# Patient Record
Sex: Female | Born: 1950 | Race: White | Hispanic: No | Marital: Married | Smoking: Former smoker
Health system: Southern US, Community
[De-identification: ages and names within clinical notes are randomized; demographics above are authoritative.]

## PROBLEM LIST (undated history)

## (undated) ENCOUNTER — Ambulatory Visit (HOSPITAL_COMMUNITY): Admission: EM

## (undated) DIAGNOSIS — G4733 Obstructive sleep apnea (adult) (pediatric): Secondary | ICD-10-CM

## (undated) DIAGNOSIS — Z8701 Personal history of pneumonia (recurrent): Secondary | ICD-10-CM

## (undated) DIAGNOSIS — J342 Deviated nasal septum: Secondary | ICD-10-CM

## (undated) DIAGNOSIS — R0789 Other chest pain: Secondary | ICD-10-CM

## (undated) DIAGNOSIS — E119 Type 2 diabetes mellitus without complications: Secondary | ICD-10-CM

## (undated) DIAGNOSIS — K090 Developmental odontogenic cysts: Secondary | ICD-10-CM

## (undated) DIAGNOSIS — M65312 Trigger thumb, left thumb: Secondary | ICD-10-CM

## (undated) DIAGNOSIS — T7840XA Allergy, unspecified, initial encounter: Secondary | ICD-10-CM

## (undated) DIAGNOSIS — I10 Essential (primary) hypertension: Secondary | ICD-10-CM

## (undated) DIAGNOSIS — M1990 Unspecified osteoarthritis, unspecified site: Secondary | ICD-10-CM

## (undated) HISTORY — DX: Allergy, unspecified, initial encounter: T78.40XA

## (undated) HISTORY — DX: Personal history of pneumonia (recurrent): Z87.01

## (undated) HISTORY — PX: TONSILLECTOMY: SHX5217

## (undated) HISTORY — PX: TUBAL LIGATION: SHX77

## (undated) HISTORY — DX: Other chest pain: R07.89

## (undated) HISTORY — DX: Developmental odontogenic cysts: K09.0

## (undated) HISTORY — DX: Obstructive sleep apnea (adult) (pediatric): G47.33

## (undated) HISTORY — PX: CHOLECYSTECTOMY: SHX55

## (undated) HISTORY — DX: Essential (primary) hypertension: I10

## (undated) HISTORY — DX: Deviated nasal septum: J34.2

## (undated) HISTORY — PX: OTHER SURGICAL HISTORY: SHX169

---

## 1997-12-10 ENCOUNTER — Ambulatory Visit (HOSPITAL_COMMUNITY): Admission: RE | Admit: 1997-12-10 | Discharge: 1997-12-10 | Payer: Self-pay | Admitting: Internal Medicine

## 1998-07-16 ENCOUNTER — Encounter: Payer: Self-pay | Admitting: Internal Medicine

## 1998-07-16 ENCOUNTER — Ambulatory Visit (HOSPITAL_COMMUNITY): Admission: RE | Admit: 1998-07-16 | Discharge: 1998-07-16 | Payer: Self-pay | Admitting: Internal Medicine

## 1999-03-19 ENCOUNTER — Ambulatory Visit (HOSPITAL_COMMUNITY): Admission: RE | Admit: 1999-03-19 | Discharge: 1999-03-19 | Payer: Self-pay | Admitting: Internal Medicine

## 1999-03-19 ENCOUNTER — Encounter: Payer: Self-pay | Admitting: Internal Medicine

## 1999-07-06 ENCOUNTER — Other Ambulatory Visit: Admission: RE | Admit: 1999-07-06 | Discharge: 1999-07-06 | Payer: Self-pay | Admitting: Internal Medicine

## 1999-10-27 ENCOUNTER — Ambulatory Visit (HOSPITAL_BASED_OUTPATIENT_CLINIC_OR_DEPARTMENT_OTHER): Admission: RE | Admit: 1999-10-27 | Discharge: 1999-10-27 | Payer: Self-pay | Admitting: Orthopedic Surgery

## 2000-01-06 ENCOUNTER — Encounter: Payer: Self-pay | Admitting: Pulmonary Disease

## 2000-03-18 ENCOUNTER — Other Ambulatory Visit: Admission: RE | Admit: 2000-03-18 | Discharge: 2000-03-18 | Payer: Self-pay | Admitting: Internal Medicine

## 2000-04-26 ENCOUNTER — Ambulatory Visit (HOSPITAL_COMMUNITY): Admission: RE | Admit: 2000-04-26 | Discharge: 2000-04-26 | Payer: Self-pay | Admitting: Internal Medicine

## 2000-04-26 ENCOUNTER — Encounter: Payer: Self-pay | Admitting: Internal Medicine

## 2001-09-17 ENCOUNTER — Ambulatory Visit (HOSPITAL_BASED_OUTPATIENT_CLINIC_OR_DEPARTMENT_OTHER): Admission: RE | Admit: 2001-09-17 | Discharge: 2001-09-17 | Payer: Self-pay | Admitting: *Deleted

## 2001-10-09 ENCOUNTER — Encounter: Payer: Self-pay | Admitting: Internal Medicine

## 2001-10-09 ENCOUNTER — Ambulatory Visit (HOSPITAL_COMMUNITY): Admission: RE | Admit: 2001-10-09 | Discharge: 2001-10-09 | Payer: Self-pay | Admitting: Internal Medicine

## 2001-11-07 ENCOUNTER — Other Ambulatory Visit: Admission: RE | Admit: 2001-11-07 | Discharge: 2001-11-07 | Payer: Self-pay | Admitting: Internal Medicine

## 2001-11-17 ENCOUNTER — Ambulatory Visit (HOSPITAL_BASED_OUTPATIENT_CLINIC_OR_DEPARTMENT_OTHER): Admission: RE | Admit: 2001-11-17 | Discharge: 2001-11-17 | Payer: Self-pay | Admitting: *Deleted

## 2002-01-10 ENCOUNTER — Encounter: Payer: Self-pay | Admitting: Emergency Medicine

## 2002-01-10 ENCOUNTER — Emergency Department (HOSPITAL_COMMUNITY): Admission: EM | Admit: 2002-01-10 | Discharge: 2002-01-10 | Payer: Self-pay | Admitting: *Deleted

## 2002-02-01 ENCOUNTER — Encounter: Payer: Self-pay | Admitting: Internal Medicine

## 2003-01-25 ENCOUNTER — Other Ambulatory Visit: Admission: RE | Admit: 2003-01-25 | Discharge: 2003-01-25 | Payer: Self-pay | Admitting: Internal Medicine

## 2003-08-26 ENCOUNTER — Other Ambulatory Visit: Admission: RE | Admit: 2003-08-26 | Discharge: 2003-08-26 | Payer: Self-pay | Admitting: Gynecology

## 2004-02-11 ENCOUNTER — Ambulatory Visit (HOSPITAL_COMMUNITY): Admission: RE | Admit: 2004-02-11 | Discharge: 2004-02-11 | Payer: Self-pay | Admitting: Internal Medicine

## 2004-07-17 ENCOUNTER — Ambulatory Visit: Payer: Self-pay | Admitting: Internal Medicine

## 2004-07-22 ENCOUNTER — Encounter: Admission: RE | Admit: 2004-07-22 | Discharge: 2004-07-22 | Payer: Self-pay | Admitting: Sports Medicine

## 2004-12-25 ENCOUNTER — Emergency Department (HOSPITAL_COMMUNITY): Admission: EM | Admit: 2004-12-25 | Discharge: 2004-12-26 | Payer: Self-pay | Admitting: Emergency Medicine

## 2005-01-25 ENCOUNTER — Encounter (INDEPENDENT_AMBULATORY_CARE_PROVIDER_SITE_OTHER): Payer: Self-pay | Admitting: Specialist

## 2005-01-25 ENCOUNTER — Ambulatory Visit (HOSPITAL_COMMUNITY): Admission: RE | Admit: 2005-01-25 | Discharge: 2005-01-25 | Payer: Self-pay

## 2005-06-30 ENCOUNTER — Ambulatory Visit: Payer: Self-pay | Admitting: Internal Medicine

## 2005-09-01 ENCOUNTER — Encounter
Admission: RE | Admit: 2005-09-01 | Discharge: 2005-11-30 | Payer: Self-pay | Admitting: Physical Medicine & Rehabilitation

## 2005-09-01 ENCOUNTER — Ambulatory Visit: Payer: Self-pay | Admitting: Physical Medicine & Rehabilitation

## 2005-09-21 ENCOUNTER — Ambulatory Visit (HOSPITAL_COMMUNITY): Admission: RE | Admit: 2005-09-21 | Discharge: 2005-09-21 | Payer: Self-pay | Admitting: Internal Medicine

## 2005-11-02 ENCOUNTER — Ambulatory Visit: Payer: Self-pay | Admitting: Internal Medicine

## 2005-11-09 ENCOUNTER — Other Ambulatory Visit: Admission: RE | Admit: 2005-11-09 | Discharge: 2005-11-09 | Payer: Self-pay | Admitting: Internal Medicine

## 2005-11-09 ENCOUNTER — Encounter: Payer: Self-pay | Admitting: Internal Medicine

## 2005-11-09 ENCOUNTER — Ambulatory Visit: Payer: Self-pay | Admitting: Internal Medicine

## 2005-11-17 ENCOUNTER — Ambulatory Visit: Payer: Self-pay | Admitting: Physical Medicine & Rehabilitation

## 2005-12-29 ENCOUNTER — Ambulatory Visit: Payer: Self-pay | Admitting: Internal Medicine

## 2006-04-21 ENCOUNTER — Ambulatory Visit: Payer: Self-pay | Admitting: Internal Medicine

## 2006-04-30 ENCOUNTER — Emergency Department (HOSPITAL_COMMUNITY): Admission: EM | Admit: 2006-04-30 | Discharge: 2006-04-30 | Payer: Self-pay | Admitting: Emergency Medicine

## 2006-07-01 ENCOUNTER — Emergency Department (HOSPITAL_COMMUNITY): Admission: EM | Admit: 2006-07-01 | Discharge: 2006-07-01 | Payer: Self-pay | Admitting: Emergency Medicine

## 2006-07-04 ENCOUNTER — Ambulatory Visit: Payer: Self-pay | Admitting: Internal Medicine

## 2007-04-17 ENCOUNTER — Telehealth: Payer: Self-pay | Admitting: Internal Medicine

## 2007-06-16 ENCOUNTER — Telehealth: Payer: Self-pay | Admitting: Internal Medicine

## 2007-07-17 ENCOUNTER — Telehealth: Payer: Self-pay | Admitting: Internal Medicine

## 2007-07-21 ENCOUNTER — Telehealth: Payer: Self-pay | Admitting: Internal Medicine

## 2007-07-27 ENCOUNTER — Ambulatory Visit (HOSPITAL_COMMUNITY): Admission: RE | Admit: 2007-07-27 | Discharge: 2007-07-27 | Payer: Self-pay | Admitting: Internal Medicine

## 2007-07-28 ENCOUNTER — Ambulatory Visit: Payer: Self-pay | Admitting: Internal Medicine

## 2007-07-28 LAB — CONVERTED CEMR LAB
Bilirubin Urine: NEGATIVE
Glucose, Urine, Semiquant: NEGATIVE
Ketones, urine, test strip: NEGATIVE
Nitrite: NEGATIVE
Protein, U semiquant: NEGATIVE
Specific Gravity, Urine: 1.02
Urobilinogen, UA: 0.2
WBC Urine, dipstick: NEGATIVE
pH: 7.5

## 2007-07-30 LAB — CONVERTED CEMR LAB
ALT: 22 units/L (ref 0–35)
AST: 19 units/L (ref 0–37)
Albumin: 4 g/dL (ref 3.5–5.2)
Alkaline Phosphatase: 66 units/L (ref 39–117)
BUN: 12 mg/dL (ref 6–23)
Basophils Absolute: 0 10*3/uL (ref 0.0–0.1)
Basophils Relative: 0.2 % (ref 0.0–1.0)
Bilirubin, Direct: 0.1 mg/dL (ref 0.0–0.3)
CO2: 31 meq/L (ref 19–32)
Calcium: 9.3 mg/dL (ref 8.4–10.5)
Chloride: 107 meq/L (ref 96–112)
Cholesterol: 203 mg/dL (ref 0–200)
Creatinine, Ser: 1 mg/dL (ref 0.4–1.2)
Direct LDL: 123.3 mg/dL
Eosinophils Absolute: 0.2 10*3/uL (ref 0.0–0.6)
Eosinophils Relative: 3.9 % (ref 0.0–5.0)
GFR calc Af Amer: 74 mL/min
GFR calc non Af Amer: 61 mL/min
Glucose, Bld: 89 mg/dL (ref 70–99)
HCT: 43.6 % (ref 36.0–46.0)
HDL: 38.8 mg/dL — ABNORMAL LOW (ref >39.0)
Hemoglobin: 14.8 g/dL (ref 12.0–15.0)
Lymphocytes Relative: 40.8 % (ref 12.0–46.0)
MCHC: 34.1 g/dL (ref 30.0–36.0)
MCV: 90.3 fL (ref 78.0–100.0)
Monocytes Absolute: 0.5 10*3/uL (ref 0.2–0.7)
Monocytes Relative: 9 % (ref 3.0–11.0)
Neutro Abs: 2.7 10*3/uL (ref 1.4–7.7)
Neutrophils Relative %: 46.1 % (ref 43.0–77.0)
Platelets: 213 10*3/uL (ref 150–400)
Potassium: 5.1 meq/L (ref 3.5–5.1)
RBC: 4.82 M/uL (ref 3.87–5.11)
RDW: 12.6 % (ref 11.5–14.6)
Sodium: 144 meq/L (ref 135–145)
TSH: 1.22 microintl units/mL (ref 0.35–5.50)
Total Bilirubin: 1.1 mg/dL (ref 0.3–1.2)
Total CHOL/HDL Ratio: 5.2
Total Protein: 6 g/dL (ref 6.0–8.3)
Triglycerides: 159 mg/dL — ABNORMAL HIGH (ref 0–149)
VLDL: 32 mg/dL (ref 0–40)
WBC: 5.8 10*3/uL (ref 4.5–10.5)

## 2007-08-15 ENCOUNTER — Telehealth: Payer: Self-pay | Admitting: Internal Medicine

## 2007-08-30 ENCOUNTER — Ambulatory Visit: Payer: Self-pay | Admitting: Internal Medicine

## 2007-08-30 ENCOUNTER — Telehealth (INDEPENDENT_AMBULATORY_CARE_PROVIDER_SITE_OTHER): Payer: Self-pay | Admitting: *Deleted

## 2007-08-30 DIAGNOSIS — R0789 Other chest pain: Secondary | ICD-10-CM

## 2007-08-30 DIAGNOSIS — J019 Acute sinusitis, unspecified: Secondary | ICD-10-CM | POA: Insufficient documentation

## 2007-08-30 DIAGNOSIS — I10 Essential (primary) hypertension: Secondary | ICD-10-CM | POA: Insufficient documentation

## 2007-08-30 DIAGNOSIS — G4731 Primary central sleep apnea: Secondary | ICD-10-CM

## 2007-08-30 DIAGNOSIS — G4733 Obstructive sleep apnea (adult) (pediatric): Secondary | ICD-10-CM

## 2007-08-30 HISTORY — DX: Obstructive sleep apnea (adult) (pediatric): G47.33

## 2007-08-30 HISTORY — DX: Other chest pain: R07.89

## 2007-09-04 ENCOUNTER — Telehealth: Payer: Self-pay | Admitting: *Deleted

## 2007-10-12 ENCOUNTER — Encounter: Payer: Self-pay | Admitting: Internal Medicine

## 2007-10-12 ENCOUNTER — Encounter: Payer: Self-pay | Admitting: Cardiovascular Disease

## 2007-10-13 ENCOUNTER — Ambulatory Visit: Payer: Self-pay | Admitting: Pulmonary Disease

## 2007-10-13 DIAGNOSIS — K219 Gastro-esophageal reflux disease without esophagitis: Secondary | ICD-10-CM | POA: Insufficient documentation

## 2007-10-16 ENCOUNTER — Telehealth: Payer: Self-pay | Admitting: Internal Medicine

## 2007-10-18 ENCOUNTER — Telehealth: Payer: Self-pay | Admitting: Internal Medicine

## 2007-10-24 ENCOUNTER — Telehealth: Payer: Self-pay | Admitting: Internal Medicine

## 2007-10-30 ENCOUNTER — Encounter: Payer: Self-pay | Admitting: Internal Medicine

## 2007-10-30 ENCOUNTER — Other Ambulatory Visit: Admission: RE | Admit: 2007-10-30 | Discharge: 2007-10-30 | Payer: Self-pay | Admitting: Internal Medicine

## 2007-10-30 ENCOUNTER — Ambulatory Visit: Payer: Self-pay | Admitting: Internal Medicine

## 2007-10-30 DIAGNOSIS — F4321 Adjustment disorder with depressed mood: Secondary | ICD-10-CM

## 2007-10-30 LAB — CONVERTED CEMR LAB: Pap Smear: NORMAL

## 2007-11-19 ENCOUNTER — Encounter: Payer: Self-pay | Admitting: Internal Medicine

## 2007-12-12 ENCOUNTER — Ambulatory Visit: Payer: Self-pay | Admitting: Pulmonary Disease

## 2007-12-12 DIAGNOSIS — M79609 Pain in unspecified limb: Secondary | ICD-10-CM | POA: Insufficient documentation

## 2007-12-12 DIAGNOSIS — G4700 Insomnia, unspecified: Secondary | ICD-10-CM

## 2007-12-21 ENCOUNTER — Telehealth: Payer: Self-pay | Admitting: Internal Medicine

## 2007-12-25 ENCOUNTER — Telehealth: Payer: Self-pay | Admitting: Pulmonary Disease

## 2008-03-04 ENCOUNTER — Telehealth: Payer: Self-pay | Admitting: Internal Medicine

## 2008-03-08 ENCOUNTER — Encounter: Payer: Self-pay | Admitting: Internal Medicine

## 2008-03-14 ENCOUNTER — Encounter: Admission: RE | Admit: 2008-03-14 | Discharge: 2008-03-14 | Payer: Self-pay | Admitting: Sports Medicine

## 2008-03-22 ENCOUNTER — Ambulatory Visit: Payer: Self-pay | Admitting: Pulmonary Disease

## 2008-03-23 ENCOUNTER — Encounter: Admission: RE | Admit: 2008-03-23 | Discharge: 2008-03-23 | Payer: Self-pay | Admitting: Sports Medicine

## 2008-03-26 ENCOUNTER — Ambulatory Visit: Payer: Self-pay | Admitting: Family Medicine

## 2008-03-26 DIAGNOSIS — N751 Abscess of Bartholin's gland: Secondary | ICD-10-CM

## 2008-04-04 ENCOUNTER — Encounter: Admission: RE | Admit: 2008-04-04 | Discharge: 2008-04-04 | Payer: Self-pay | Admitting: Family Medicine

## 2008-10-05 ENCOUNTER — Emergency Department (HOSPITAL_COMMUNITY): Admission: EM | Admit: 2008-10-05 | Discharge: 2008-10-05 | Payer: Self-pay | Admitting: Family Medicine

## 2008-12-19 ENCOUNTER — Telehealth: Payer: Self-pay | Admitting: Internal Medicine

## 2008-12-27 ENCOUNTER — Ambulatory Visit: Payer: Self-pay | Admitting: Internal Medicine

## 2008-12-30 LAB — CONVERTED CEMR LAB
ALT: 20 units/L (ref 0–35)
AST: 23 units/L (ref 0–37)
Albumin: 4.1 g/dL (ref 3.5–5.2)
Alkaline Phosphatase: 67 units/L (ref 39–117)
BUN: 14 mg/dL (ref 6–23)
Basophils Absolute: 0 10*3/uL (ref 0.0–0.1)
Basophils Relative: 0.1 % (ref 0.0–3.0)
Bilirubin, Direct: 0 mg/dL (ref 0.0–0.3)
CO2: 28 meq/L (ref 19–32)
Calcium: 8.8 mg/dL (ref 8.4–10.5)
Chloride: 104 meq/L (ref 96–112)
Cholesterol: 215 mg/dL — ABNORMAL HIGH (ref 0–200)
Creatinine, Ser: 0.9 mg/dL (ref 0.4–1.2)
Direct LDL: 140.1 mg/dL
Eosinophils Absolute: 0.1 10*3/uL (ref 0.0–0.7)
Eosinophils Relative: 2.8 % (ref 0.0–5.0)
GFR calc non Af Amer: 68.45 mL/min (ref >60)
Glucose, Bld: 89 mg/dL (ref 70–99)
HCT: 41.6 % (ref 36.0–46.0)
HDL: 48.3 mg/dL (ref >39.00)
Hemoglobin: 14.3 g/dL (ref 12.0–15.0)
Lymphocytes Relative: 43.6 % (ref 12.0–46.0)
Lymphs Abs: 2.2 10*3/uL (ref 0.7–4.0)
MCHC: 34.3 g/dL (ref 30.0–36.0)
MCV: 90.2 fL (ref 78.0–100.0)
Monocytes Absolute: 0.5 10*3/uL (ref 0.1–1.0)
Monocytes Relative: 10.4 % (ref 3.0–12.0)
Neutro Abs: 2.1 10*3/uL (ref 1.4–7.7)
Neutrophils Relative %: 43.1 % (ref 43.0–77.0)
Platelets: 217 10*3/uL (ref 150.0–400.0)
Potassium: 3.7 meq/L (ref 3.5–5.1)
RBC: 4.61 M/uL (ref 3.87–5.11)
RDW: 13.1 % (ref 11.5–14.6)
Sodium: 140 meq/L (ref 135–145)
TSH: 1.39 microintl units/mL (ref 0.35–5.50)
Total Bilirubin: 1 mg/dL (ref 0.3–1.2)
Total CHOL/HDL Ratio: 4
Total Protein: 6.6 g/dL (ref 6.0–8.3)
Triglycerides: 160 mg/dL — ABNORMAL HIGH (ref 0.0–149.0)
VLDL: 32 mg/dL (ref 0.0–40.0)
WBC: 4.9 10*3/uL (ref 4.5–10.5)

## 2009-01-03 LAB — CONVERTED CEMR LAB: Vit D, 25-Hydroxy: 26 ng/mL — ABNORMAL LOW (ref 30–89)

## 2009-01-06 ENCOUNTER — Ambulatory Visit: Payer: Self-pay | Admitting: Internal Medicine

## 2009-01-22 ENCOUNTER — Ambulatory Visit: Payer: Self-pay | Admitting: Family Medicine

## 2009-01-28 ENCOUNTER — Ambulatory Visit: Payer: Self-pay | Admitting: Internal Medicine

## 2009-02-06 ENCOUNTER — Telehealth: Payer: Self-pay | Admitting: *Deleted

## 2009-07-14 ENCOUNTER — Telehealth: Payer: Self-pay | Admitting: *Deleted

## 2009-09-17 ENCOUNTER — Telehealth: Payer: Self-pay | Admitting: *Deleted

## 2009-10-22 ENCOUNTER — Ambulatory Visit: Payer: Self-pay | Admitting: Internal Medicine

## 2009-11-13 ENCOUNTER — Ambulatory Visit (HOSPITAL_COMMUNITY): Admission: RE | Admit: 2009-11-13 | Discharge: 2009-11-13 | Payer: Self-pay | Admitting: Internal Medicine

## 2009-12-16 ENCOUNTER — Ambulatory Visit: Payer: Self-pay | Admitting: Internal Medicine

## 2009-12-16 LAB — CONVERTED CEMR LAB
ALT: 20 units/L (ref 0–35)
AST: 19 units/L (ref 0–37)
Albumin: 4.3 g/dL (ref 3.5–5.2)
Alkaline Phosphatase: 66 units/L (ref 39–117)
BUN: 14 mg/dL (ref 6–23)
Basophils Absolute: 0 10*3/uL (ref 0.0–0.1)
Basophils Relative: 1 % (ref 0.0–3.0)
Bilirubin Urine: NEGATIVE
Bilirubin, Direct: 0.1 mg/dL (ref 0.0–0.3)
Blood in Urine, dipstick: NEGATIVE
CO2: 28 meq/L (ref 19–32)
Calcium: 8.8 mg/dL (ref 8.4–10.5)
Chloride: 105 meq/L (ref 96–112)
Cholesterol: 228 mg/dL — ABNORMAL HIGH (ref 0–200)
Creatinine, Ser: 0.9 mg/dL (ref 0.4–1.2)
Direct LDL: 137.8 mg/dL
Eosinophils Absolute: 0.2 10*3/uL (ref 0.0–0.7)
Eosinophils Relative: 4 % (ref 0.0–5.0)
GFR calc non Af Amer: 70.94 mL/min (ref >60)
Glucose, Bld: 85 mg/dL (ref 70–99)
Glucose, Urine, Semiquant: NEGATIVE
HCT: 40.3 % (ref 36.0–46.0)
HDL: 51.1 mg/dL (ref >39.00)
Hemoglobin: 14 g/dL (ref 12.0–15.0)
Ketones, urine, test strip: NEGATIVE
Lymphocytes Relative: 43 % (ref 12.0–46.0)
Lymphs Abs: 1.9 10*3/uL (ref 0.7–4.0)
MCHC: 34.8 g/dL (ref 30.0–36.0)
MCV: 90.8 fL (ref 78.0–100.0)
Monocytes Absolute: 0.4 10*3/uL (ref 0.1–1.0)
Monocytes Relative: 9.4 % (ref 3.0–12.0)
Neutro Abs: 1.9 10*3/uL (ref 1.4–7.7)
Neutrophils Relative %: 42.6 % — ABNORMAL LOW (ref 43.0–77.0)
Nitrite: NEGATIVE
Platelets: 209 10*3/uL (ref 150.0–400.0)
Potassium: 4.3 meq/L (ref 3.5–5.1)
RBC: 4.44 M/uL (ref 3.87–5.11)
RDW: 15.2 % — ABNORMAL HIGH (ref 11.5–14.6)
Sodium: 140 meq/L (ref 135–145)
Specific Gravity, Urine: 1.02
TSH: 1.36 microintl units/mL (ref 0.35–5.50)
Total Bilirubin: 0.8 mg/dL (ref 0.3–1.2)
Total CHOL/HDL Ratio: 4
Total Protein: 6.7 g/dL (ref 6.0–8.3)
Triglycerides: 211 mg/dL — ABNORMAL HIGH (ref 0.0–149.0)
Urobilinogen, UA: 0.2
VLDL: 42.2 mg/dL — ABNORMAL HIGH (ref 0.0–40.0)
WBC Urine, dipstick: NEGATIVE
WBC: 4.5 10*3/uL (ref 4.5–10.5)
pH: 8

## 2009-12-22 ENCOUNTER — Ambulatory Visit: Payer: Self-pay | Admitting: Internal Medicine

## 2009-12-22 DIAGNOSIS — R41840 Attention and concentration deficit: Secondary | ICD-10-CM | POA: Insufficient documentation

## 2009-12-22 DIAGNOSIS — E785 Hyperlipidemia, unspecified: Secondary | ICD-10-CM

## 2009-12-22 DIAGNOSIS — E669 Obesity, unspecified: Secondary | ICD-10-CM

## 2010-01-13 ENCOUNTER — Ambulatory Visit: Payer: Self-pay | Admitting: Internal Medicine

## 2010-01-13 DIAGNOSIS — T50995A Adverse effect of other drugs, medicaments and biological substances, initial encounter: Secondary | ICD-10-CM

## 2010-02-09 ENCOUNTER — Ambulatory Visit: Payer: Self-pay | Admitting: Cardiology

## 2010-06-09 ENCOUNTER — Ambulatory Visit
Admission: RE | Admit: 2010-06-09 | Discharge: 2010-06-09 | Payer: Self-pay | Source: Home / Self Care | Attending: Internal Medicine | Admitting: Internal Medicine

## 2010-06-09 ENCOUNTER — Encounter: Payer: Self-pay | Admitting: Internal Medicine

## 2010-06-09 DIAGNOSIS — S60459A Superficial foreign body of unspecified finger, initial encounter: Secondary | ICD-10-CM | POA: Insufficient documentation

## 2010-06-26 ENCOUNTER — Emergency Department (HOSPITAL_COMMUNITY)
Admission: EM | Admit: 2010-06-26 | Discharge: 2010-06-26 | Payer: Self-pay | Source: Home / Self Care | Admitting: Emergency Medicine

## 2010-06-29 ENCOUNTER — Telehealth: Payer: Self-pay | Admitting: Internal Medicine

## 2010-06-29 LAB — CBC
HCT: 42.2 % (ref 36.0–46.0)
Hemoglobin: 14.4 g/dL (ref 12.0–15.0)
MCH: 29.8 pg (ref 26.0–34.0)
MCHC: 34.1 g/dL (ref 30.0–36.0)
MCV: 87.4 fL (ref 78.0–100.0)
Platelets: 241 10*3/uL (ref 150–400)
RBC: 4.83 MIL/uL (ref 3.87–5.11)
RDW: 13.2 % (ref 11.5–15.5)
WBC: 5.4 10*3/uL (ref 4.0–10.5)

## 2010-06-29 LAB — BASIC METABOLIC PANEL
BUN: 9 mg/dL (ref 6–23)
CO2: 28 mEq/L (ref 19–32)
Calcium: 9.2 mg/dL (ref 8.4–10.5)
Chloride: 101 mEq/L (ref 96–112)
Creatinine, Ser: 1.08 mg/dL (ref 0.4–1.2)
GFR calc Af Amer: >60 mL/min (ref >60)
GFR calc non Af Amer: 52 mL/min — ABNORMAL LOW (ref >60)
Glucose, Bld: 149 mg/dL — ABNORMAL HIGH (ref 70–99)
Potassium: 3.7 mEq/L (ref 3.5–5.1)
Sodium: 139 mEq/L (ref 135–145)

## 2010-06-29 LAB — DIFFERENTIAL
Basophils Absolute: 0 10*3/uL (ref 0.0–0.1)
Basophils Relative: 1 % (ref 0–1)
Eosinophils Absolute: 0.2 10*3/uL (ref 0.0–0.7)
Eosinophils Relative: 3 % (ref 0–5)
Lymphocytes Relative: 39 % (ref 12–46)
Lymphs Abs: 2.1 10*3/uL (ref 0.7–4.0)
Monocytes Absolute: 0.4 10*3/uL (ref 0.1–1.0)
Monocytes Relative: 8 % (ref 3–12)
Neutro Abs: 2.6 10*3/uL (ref 1.7–7.7)
Neutrophils Relative %: 49 % (ref 43–77)

## 2010-06-29 LAB — POCT CARDIAC MARKERS
CKMB, poc: 1.3 ng/mL (ref 1.0–8.0)
Myoglobin, poc: 78.3 ng/mL (ref 12–200)
Troponin i, poc: <0.05 ng/mL (ref 0.00–0.09)

## 2010-07-05 ENCOUNTER — Encounter: Payer: Self-pay | Admitting: Internal Medicine

## 2010-07-13 ENCOUNTER — Telehealth: Payer: Self-pay | Admitting: Internal Medicine

## 2010-07-14 NOTE — Progress Notes (Signed)
Summary: refill on zolpidem  Phone Note From Pharmacy   Caller: [facility] [address] [phone]* Reason for Call: Needs renewal Details for Reason: Zolpidem  Summary of Call: last filled on 08/14/09 #30  Initial call taken by: Romualdo Bolk, CMA (AAMA),  September 17, 2009 1:10 PM  Follow-up for Phone Call        ok x 3  she is due for cpx or yearly visit in July .  haver her make appt for this .  Follow-up by: Madelin Headings MD,  September 17, 2009 2:06 PM  Additional Follow-up for Phone Call Additional follow up Details #1::        Rx faxed to the pharmacy and pt aware. She will call back to make a cpx appt in July. Additional Follow-up by: Romualdo Bolk, CMA (AAMA),  September 17, 2009 2:09 PM    Prescriptions: ZOLPIDEM TARTRATE 10 MG TABS (ZOLPIDEM TARTRATE) 1 by mouth at bedtime  #30 x 2   Entered by:   Romualdo Bolk, CMA (AAMA)   Authorized by:   Madelin Headings MD   Signed by:   Romualdo Bolk, CMA (AAMA) on 09/17/2009   Method used:   Handwritten   RxID:   [id number]

## 2010-07-14 NOTE — Assessment & Plan Note (Signed)
Summary: CPX // RS   Vital Signs:  Patient profile:   60 year old female Menstrual status:  postmenopausal Height:      63.5 inches Weight:      206 pounds BMI:     36.05 Pulse rate:   66 / minute BP sitting:   100 / 70  (left arm) Cuff size:   large  Vitals Entered By: Romualdo Bolk, CMA (AAMA) (December 22, 2009 10:06 AM)  Nutrition Counseling: Patient's BMI is greater than 25 and therefore counseled on weight management options. CC: CPX- Discuss doing a pap-Discuss going on something for ADHD and changing celexa to something else for depression due to weight gain.   History of Present Illness: Rachel Blair comes in today  for preventive visit . Since last visit  here  there have been no major changes in health status  .  However she ahd a number of concerns and medical issues . HT  : controlled  No se of medds Sleep   using ambien  OSA  not treating.   by personal choice.   tried cpap  for 6 months    GERD:  no rx meds currently  mood  :   Gained weight on celexa.    concern about attention and mood.  depression .sx  ? could she have add .  But no dx.    Preventive Care Screening  Pap Smear:    Date:  10/30/2007    Results:  normal   Prior Values:    Mammogram:  ASSESSMENT: Negative - BI-RADS 1^MM DIGITAL SCREENING (11/13/2009)    Colonoscopy:  Location:  [facility].   (01/28/2009)   Preventive Screening-Counseling & Management  Alcohol-Tobacco     Alcohol drinks/day: <1     Alcohol type: wine     Smoking Status: quit     Year Quit: 2000     Pack years: 1 ppd x 20 years     Tobacco Counseling: not to resume use of tobacco products  Caffeine-Diet-Exercise     Caffeine use/day: 3     Does Patient Exercise: yes     Type of exercise: swimming and walking  Hep-HIV-STD-Contraception     Sun Exposure-Excessive: no  Safety-Violence-Falls     Seat Belt Use: yes     Smoke Detectors: yes  Current Medications (verified): 1)  Hydrochlorothiazide 25  Mg Tabs (Hydrochlorothiazide) 2)  Zolpidem Tartrate 10 Mg Tabs (Zolpidem Tartrate) .Marland Kitchen.. 1 By Mouth At Bedtime 3)  Celebrex 200 Mg Caps (Celecoxib) .Marland Kitchen.. 1 By Mouth Once Daily As Needed Per Dr Leeanne Deed 4)  Fluticasone Propionate 50 Mcg/act Susp (Fluticasone Propionate) .... Take 2 Sprays Each Nostril  For Allergy Control 5)  Aleve 220 Mg Tabs (Naproxen Sodium) 6)  Celexa 20 Mg Tabs (Citalopram Hydrobromide)  Allergies (verified): 1)  * Vicodin 2)  * Avinza  Past History:  Past medical, surgical, family and social histories (including risk factors) reviewed, and no changes noted (except as noted below).  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  Past Surgical History: Reviewed history from 10/13/2007 and no changes required. Cholecystectomy Tonsillectomy Tubal ligation Left thumb trigger finger release Biopsy and bone graft of left maxillary sinus odontogenic cyst  Past History:  Care Management: Cardiology: [facility] Pulmonary: [facility]ood Podiatry: [facility]. Sleep:    Family History: Reviewed history from 12/27/2008 and no changes  required.  Father - emphysema, stroke, chf  Mother - heart disease Brother - lung cancer  Brother - snoring Daughter: RA    Social History: Reviewed history from 10/22/2009 and no changes required. 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         plantar fasciitis   Physical Exam General Appearance: well developed, well nourished, no acute distress Eyes: conjunctiva and lids normal, PERRLA, EOMI, WNL Ears, Nose, Mouth, Throat: TM clear, nares clear, oral exam WNL Neck: supple, no lymphadenopathy, no thyromegaly, no JVD Respiratory: clear to auscultation and percussion, respiratory effort normal Cardiovascular: regular rate and rhythm, S1-S2, no murmur, rub or gallop, no bruits, peripheral pulses normal and symmetric, no cyanosis, clubbing, edema or varicosities Chest: no scars, masses, tenderness; no asymmetry, skin changes, nipple discharge   Gastrointestinal: soft, non-tender; no hepatosplenomegaly, masses; active bowel sounds all quadrants,  Lymphatic: no cervical, axillary or inguinal adenopathy Musculoskeletal: gait normal, muscle tone and strength WNL, no joint swelling, effusions, discoloration, crepitus  Skin: clear, good turgor, color WNL, no rashes, lesions, or ulcerations Neurologic: normal mental status, normal reflexes, normal strength, sensation, and motion Psychiatric: alert; oriented to person, place and time Other Exam:  see labs     Impression & Recommendations:  Problem # 1:  PREVENTIVE HEALTH CARE (ICD-V70.0) Discussed nutrition,exercise,diet,healthy weight, vitamin D and calcium.   Problem # 2:  HYPERTENSION (ICD-401.9)  Her updated medication list for this problem includes:    Hydrochlorothiazide 25 Mg Tabs (Hydrochlorothiazide) .Marland Kitchen... 1 by mouth once daily  BP today: 100/70 Prior BP: 120/80 (10/22/2009)  Prior 10 Yr Risk Heart Disease: Not enough information (12/27/2008)  Labs Reviewed: K+: 4.3 (12/16/2009) Creat: : 0.9 (12/16/2009)   Chol: 228 (12/16/2009)   HDL: 51.10 (12/16/2009)   LDL: DEL (07/28/2007)   TG: 211.0 (12/16/2009)  Problem # 3:  G E R D  (ICD-530.81)  Problem # 4:  OBSTRUCTIVE SLEEP APNEA (ICD-327.23) NOt on rx  currently   by her choice  .    disc this  in regard to health  risk   and attention  pt says rx doesnt help her .   Problem # 5:  OBESITY (ICD-278.00) counseled  Ht: 63.5 (12/22/2009)   Wt: 206 (12/22/2009)   BMI: 36.05 (12/22/2009)  Problem # 6:  ADJUSTMENT DISORDER WITH DEPRESSED MOOD (ICD-309.0) begin  cymbalata and may help her joimt pains also   Problem # 7:  ATTENTION OR CONCENTRATION DEFICIT (ICD-799.51) osa and mood certainly could affect this .   doesnt exclude add  but need to rx other and before  looking at other dx.   considre further eval aas needed in follow up   Remus Loffler could also cause a problem with memory   Problem # 8:  INSOMNIA (ICD-780.52) Assessment: Comment Only  Her updated medication list for this problem includes:    Zolpidem Tartrate 10 Mg Tabs (Zolpidem tartrate) .Marland Kitchen... 1 by mouth at bedtime  Problem # 9:  HYPERLIPIDEMIA (ICD-272.4) rec lifestyle intervention  at prsent weight loss etc.  Labs Reviewed: SGOT: 19 (12/16/2009)   SGPT: 20 (12/16/2009)  Prior 10 Yr Risk Heart Disease: Not enough information (12/27/2008)   HDL:51.10 (12/16/2009), 48.30 (12/27/2008)  LDL:DEL (07/28/2007)  Chol:228 (12/16/2009), 215 (12/27/2008)  Trig:211.0 (12/16/2009), 160.0 (12/27/2008)  Problem # 10:  plantar fasciitis    Complete Medication List: 1)  Hydrochlorothiazide 25 Mg Tabs (Hydrochlorothiazide) .Marland Kitchen.. 1 by mouth once daily 2)  Zolpidem Tartrate 10 Mg Tabs (Zolpidem tartrate) .Marland Kitchen.. 1 by mouth at bedtime 3)  Celebrex 200 Mg Caps (Celecoxib) .Marland Kitchen.. 1 by mouth once daily as needed per dr Leeanne Deed 4)  Fluticasone Propionate 50 Mcg/act Susp (Fluticasone propionate) .... Take 2 sprays each nostril  for allergy control 5)  Aleve 220 Mg Tabs (Naproxen sodium) 6)  Cymbalta 60 Mg Cpep (Duloxetine hcl) .... Take 30 mg per day  for a week and then 60 mg per day  Patient Instructions: 1)  good sleep will  help losing weight . 2)  begin the 30 mg of cymbalta and then 60 mg per day    3)  rov in 3 weeks  or as needed.  4)  Attention can be affected  by sleep deprivation and depression also.  Prescriptions: HYDROCHLOROTHIAZIDE 25 MG TABS (HYDROCHLOROTHIAZIDE) 1 by mouth once daily  #90 x 3   Entered and Authorized by:   Madelin Headings MD   Signed by:   Madelin Headings MD on 12/22/2009   Method used:   Electronically to        [facility] [address] [phone]* (retail)       [address]       [address]       [place], [place]  [address]       Ph: [phone]       Fax: [phone]   RxID:   [id number] ZOLPIDEM TARTRATE 10 MG TABS (ZOLPIDEM TARTRATE) 1 by mouth at bedtime  #30 x 3   Entered and Authorized by:   Madelin Headings MD   Signed by:   Madelin Headings MD on 12/22/2009   Method used:   Print then Give to Patient   RxID:   [id number]   Preventive Care Screening  Pap Smear:    Date:  10/30/2007    Results:  normal   Prior Values:    Mammogram:  ASSESSMENT: Negative - BI-RADS 1^MM DIGITAL SCREENING (11/13/2009)    Colonoscopy:  Location:  [facility].   (01/28/2009)

## 2010-07-14 NOTE — Progress Notes (Signed)
Summary: med refill  Phone Note Call from Patient Call back at Home Phone [phone]   Caller: Patient Call For: Madelin Headings MD Summary of Call: pt needs refill on ambien [facility]  [phone] Initial call taken by: Heron Sabins,  July 14, 2009 11:27 AM  Follow-up for Phone Call        Spoke to and she also had a side effect on lunesta with a metal taste. Pt states she doesn't think it was the [place] that made her mean and irriable. So she would like to try it again. I told pt that I would check with Dr. Fabian Sharp and call her to let her know when we called in rx. Follow-up by: Romualdo Bolk, CMA Duncan Dull),  July 14, 2009 2:28 PM  Additional Follow-up for Phone Call Additional follow up Details #1::        ok to try to refill her ambien 10  # 30  but I see that she has refills  on her rx in July . She could refill this unless inactive   and call us  about status.  if not call in 30# of the 10 mg. Additional Follow-up by: Madelin Headings MD,  July 15, 2009 8:29 AM    Additional Follow-up for Phone Call Additional follow up Details #2::    Spoke to pt and she has already used those up. So I told pt that we would call in #30 tabs in today. Pt states that she didn't know what was wrong back then and she was ready to blame anything. But since she has been taking the [place] for awhile it has been doing fine. Rx called into the pharmacy. Follow-up by: Romualdo Bolk, CMA (AAMA),  July 15, 2009 9:18 AM  Prescriptions: ZOLPIDEM TARTRATE 10 MG TABS (ZOLPIDEM TARTRATE) 1 by mouth at bedtime  #30 x 0   Entered by:   Romualdo Bolk, CMA (AAMA)   Authorized by:   Madelin Headings MD   Signed by:   Romualdo Bolk, CMA (AAMA) on 07/15/2009   Method used:   Telephoned to ...       [facility] [address] [phone]* (retail)       [address]       [address]       [place], [place]  [address]       Ph: [phone]       Fax: [phone]   RxID:    [id number]

## 2010-07-14 NOTE — Assessment & Plan Note (Signed)
Summary: ALLERGIES? // RS   Vital Signs:  Patient profile:   60 year old female Menstrual status:  postmenopausal Height:      64 inches Weight:      207 pounds BMI:     35.66 O2 Sat:      98 % on Room air Temp:     98.6 degrees F oral Pulse rate:   86 / minute BP sitting:   120 / 80  (left arm) Cuff size:   large  Vitals Entered By: Romualdo Bolk, CMA (AAMA) (Oct 22, 2009 11:59 AM)  O2 Flow:  Room air CC: Coughing, congestion, some wheezing and sob x 4-5 weeks, some fever on 5/9   History of Present Illness: Rachel Blair comesin comes in today  for  above  problem. Onset about 6 weeks ago like allergy with  head congestion  that is now  draining  down chest.   And getting a bit worse.  tried benadryl   with temp help.  but hoarse for  a while .  ? fever a few days ago.    No sinus pain.    ocass cough But thick discolored drainage and ocass brown phlegm.No ets  no  asthma signs .Retired this year and outside a lot during this allergy season.   REmote hx of nasal steroids use with help..     BP ok  on medication  due for check in summer .  Preventive Screening-Counseling & Management  Alcohol-Tobacco     Alcohol drinks/day: <1     Alcohol type: wine     Smoking Status: quit     Year Quit: 2000     Pack years: 1 ppd x 20 years  Caffeine-Diet-Exercise     Caffeine use/day: 3     Does Patient Exercise: no  Current Medications (verified): 1)  Hydrochlorothiazide 25 Mg Tabs (Hydrochlorothiazide) 2)  Zolpidem Tartrate 10 Mg Tabs (Zolpidem Tartrate) .Marland Kitchen.. 1 By Mouth At Bedtime 3)  Celebrex 200 Mg Caps (Celecoxib) .Marland Kitchen.. 1 By Mouth Once Daily As Needed Per Dr Leeanne Deed  Allergies (verified): 1)  * Vicodin 2)  * Avinza  Past History:  Past medical, surgical, family and social histories (including risk factors) reviewed, and no changes noted (except as noted below).  Past Medical History: OSA Atypical Chest Pain HTN GERD Nasal septal deviation Odontogenic cyst of  left maxillary sinus G3P3 remote history of pneumonia, times two Allergic rhinitis?  Past Surgical History: Reviewed history from 10/13/2007 and no changes required. Cholecystectomy Tonsillectomy Tubal ligation Left thumb trigger finger release Biopsy and bone graft of left maxillary sinus odontogenic cyst  Past History:  Care Management: Cardiology: [facility] Pulmonary: [facility] Podiatry: [facility].  Family History: Reviewed history from 12/27/2008 and no changes required.  Father - emphysema, stroke, chf  Mother - heart disease Brother - lung cancer  Brother - snoring Daughter: RA    Social History: Reviewed history from 12/27/2008 and no changes required. 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  The patient denies anorexia, weight loss, vision loss, decreased hearing, chest pain, headaches, abdominal pain, transient blindness, difficulty walking, abnormal bleeding, and enlarged lymph nodes.    Physical Exam  General:  alert, well-developed, well-nourished, and well-hydrated.  in nad  hoarse  non toxic  Head:  Normocephalic and atraumatic without obvious abnormalities. No apparent alopecia or balding. Eyes:  clear  Ears:  R ear normal, L ear normal, and no external deformities.   Nose:  no external deformity and no external erythema.  congested and  no tenderness Mouth:  pharynx pink and moist.   Neck:  No deformities, masses, or tenderness noted. Lungs:  Normal respiratory effort, chest expands symmetrically. Lungs are clear to auscultation, no crackles or wheezes.no dullness.   Heart:  Normal rate and regular rhythm. S1 and S2 normal without gallop, murmur, click, rub or other extra sounds.   Impression & Recommendations:  Problem # 1:  SINUSITIS- ACUTE-NOS (ICD-461.9)  poss allergy induced  .    Expectant management and rx  Her updated medication list for this problem includes:    Amoxicillin-pot Clavulanate 875-125 Mg Tabs (Amoxicillin-pot clavulanate) .Marland Kitchen... 1 by mouth two times a day for sinusitis    Fluticasone Propionate 50 Mcg/act Susp (Fluticasone propionate) .Marland Kitchen... Take 2 sprays each nostril  for allergy control  Instructed on treatment. Call if symptoms persist or worsen.   Problem # 2:  ALLERGIC RHINITIS (ICD-477.9)  Her updated medication list for this problem includes:    Fluticasone Propionate 50 Mcg/act Susp (Fluticasone propionate) .Marland Kitchen... Take 2 sprays each nostril  for allergy control  Problem # 3:  HYPERTENSION (ICD-401.9) Assessment: Comment Only  Her updated medication list for this problem includes:    Hydrochlorothiazide 25 Mg Tabs (Hydrochlorothiazide)  Complete Medication List: 1)  Hydrochlorothiazide 25 Mg Tabs (Hydrochlorothiazide) 2)  Zolpidem Tartrate 10 Mg Tabs (Zolpidem tartrate) .Marland Kitchen.. 1 by mouth at bedtime 3)  Celebrex 200 Mg Caps (Celecoxib) .Marland Kitchen.. 1 by mouth once daily as needed per dr Leeanne Deed 4)  Prednisone 20 Mg Tabs (Prednisone) .... Take 3 by mouth once daily for 2 days then 2 by mouth once daily for 3 days 5)  Amoxicillin-pot Clavulanate 875-125 Mg Tabs (Amoxicillin-pot clavulanate) .Marland Kitchen.. 1 by mouth two times a day for sinusitis 6)  Fluticasone Propionate 50 Mcg/act Susp (Fluticasone propionate) .... Take 2 sprays each nostril  for allergy control  Patient Instructions: 1)  treat    for sinusitis   and allergic sinustis . 2)  exspect  to improve within 3-5 days   3)  call if not getting better  at end of  medication. Prescriptions: FLUTICASONE PROPIONATE 50 MCG/ACT SUSP (FLUTICASONE PROPIONATE) take 2 sprays each nostril  for allergy control  #1 x prn   Entered and Authorized by:   Madelin Headings MD   Signed by:   Madelin Headings MD on 10/22/2009   Method used:   Electronically to        [facility] [address] [phone]* (retail)       [address]       [address]       [place], [place]  [address]       Ph: [phone]       Fax: [phone]   RxID:   [id number] AMOXICILLIN-POT CLAVULANATE 875-125 MG TABS (AMOXICILLIN-POT CLAVULANATE) 1 by mouth two times a day for sinusitis  #20 x 0   Entered and Authorized by:   Madelin Headings MD   Signed by:   Madelin Headings MD on 10/22/2009   Method used:   Electronically to        [facility] [address] [phone]* (retail)       [address]       [address]  [place], [place]  [address]       Ph: [phone]       Fax: [phone]   RxID:   [id number] PREDNISONE 20 MG TABS (PREDNISONE) take 3 by mouth once daily for 2 days then 2 by mouth once daily for 3 days  #12 x 0   Entered and Authorized by:   Madelin Headings MD   Signed by:   Madelin Headings MD on 10/22/2009   Method used:   Electronically to        [facility] [address] [phone]* (retail)       [address]       [address]       [place], [place]  [address]       Ph: [phone]       Fax: [phone]   RxID:   [id number]

## 2010-07-14 NOTE — Assessment & Plan Note (Signed)
Summary: 3 wk rov./njr   Vital Signs:  Patient profile:   60 year old female Menstrual status:  postmenopausal Weight:      207 pounds Pulse rate:   66 / minute BP sitting:   120 / 80  (left arm) Cuff size:   large  Vitals Entered By: Romualdo Bolk, CMA (AAMA) (January 13, 2010 8:34 AM) CC: follow-up visit, Hypertension Management- Pt d/c cymbalta because 30mg  made her have nausea and 60mg  gave her both nausea and made her sleepy.   History of Present Illness: Rachel Blair comes in today  for above  follow up .     had nausea an fatigue   was on 30 mg for a week and then 60 mg for a week or so and had to stop cause of se.   thinks celexa worked in the past but  had concern about weight gain.  Still struggle with attentional  organization al issues . OSA no change   Hypertension History:      She complains of headache, syncope, and side effects from treatment, but denies chest pain, palpitations, dyspnea with exertion, orthopnea, PND, peripheral edema, visual symptoms, and neurologic problems.  She notes the following problems with antihypertensive medication side effects: Cymbalta- nausea and sleepy.        Positive major cardiovascular risk factors include female age 31 years old or older, hyperlipidemia, and hypertension.  Negative major cardiovascular risk factors include non-tobacco-user status.     Preventive Screening-Counseling & Management  Alcohol-Tobacco     Alcohol drinks/day: <1     Alcohol type: wine     Smoking Status: quit     Year Quit: 2000     Pack years: 1 ppd x 20 years     Tobacco Counseling: not to resume use of tobacco products  Caffeine-Diet-Exercise     Caffeine use/day: 3     Does Patient Exercise: yes     Type of exercise: swimming and walking  Current Medications (verified): 1)  Hydrochlorothiazide 25 Mg Tabs (Hydrochlorothiazide) .Marland Kitchen.. 1 By Mouth Once Daily 2)  Zolpidem Tartrate 10 Mg Tabs (Zolpidem Tartrate) .Marland Kitchen.. 1 By Mouth At Bedtime 3)   Celebrex 200 Mg Caps (Celecoxib) .Marland Kitchen.. 1 By Mouth Once Daily As Needed Per Dr Leeanne Deed 4)  Fluticasone Propionate 50 Mcg/act Susp (Fluticasone Propionate) .... Take 2 Sprays Each Nostril  For Allergy Control 5)  Aleve 220 Mg Tabs (Naproxen Sodium)  Allergies (verified): 1)  * Vicodin 2)  * Avinza 3)  Cymbalta (Duloxetine Hcl)  Past History:  Past medical, surgical, family and social histories (including risk factors) reviewed, and no changes noted (except as noted below).  Past Medical History: Reviewed history from 12/22/2009 and no changes required. 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  Past Surgical History: Reviewed history from 10/13/2007 and no changes required. Cholecystectomy Tonsillectomy Tubal ligation Left thumb trigger finger release Biopsy and bone graft of left maxillary sinus odontogenic cyst  Past History:  Care Management: Cardiology: [facility] Pulmonary: [facility]ood Podiatry: [facility]. Sleep:    Family History: Reviewed history from 12/27/2008 and no changes required.  Father - emphysema, stroke, chf  Mother - heart disease Brother - lung cancer  Brother - snoring Daughter: RA    Social History: Reviewed history from 10/22/2009 and no changes required. 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   Head:  normocephalic and atraumatic.   Psych:  Oriented X3, normally interactive, good eye contact, and subdued.  midlly depressed . wll smile and nl speech and cognition.    Impression & Recommendations:  Problem # 1:  ADJUSTMENT DISORDER WITH DEPRESSED  MOOD (ICD-309.0) Assessment Unchanged se of cymbalta .. had  been on celexa for about 3 months last fall rx by her cardiologist   and went off concern about weight gain . No  alarm features .    no isolation   and staying active.  with family and gardening.     Problem # 2:  ATTENTION OR CONCENTRATION DEFICIT (ICD-799.51) depression ,  untreated OSA  and poss attentional problem also   unclear what best med management  with hx of se and  other risks.      Problem # 3:  ADVERSE REACTION TO MEDICATION (ICD-995.29) nuisance se of cymbalta  and had weight gain? with sri  but thinks celexa worked the best for depressive  signs   Problem # 4:  OSA  Assessment: Unchanged not treated .  Problem # 5:  OBESITY (ICD-278.00) Assessment: Comment Only  Ht: 63.5 (12/22/2009)   Wt: 207 (01/13/2010)   BMI: 36.05 (12/22/2009)  Problem # 6:  HYPERTENSION (ICD-401.9) Assessment: Unchanged  Her updated medication list for this problem includes:    Hydrochlorothiazide 25 Mg Tabs (Hydrochlorothiazide) .Marland Kitchen... 1 by mouth once daily  BP today: 120/80 Prior BP: 100/70 (12/22/2009)  Prior 10 Yr Risk Heart Disease: Not enough information (12/27/2008)  Labs Reviewed: K+: 4.3 (12/16/2009) Creat: : 0.9 (12/16/2009)   Chol: 228 (12/16/2009)   HDL: 51.10 (12/16/2009)   LDL: DEL (07/28/2007)   TG: 211.0 (12/16/2009)  Complete Medication List: 1)  Hydrochlorothiazide 25 Mg Tabs (Hydrochlorothiazide) .Marland Kitchen.. 1 by mouth once daily 2)  Zolpidem Tartrate 10 Mg Tabs (Zolpidem tartrate) .Marland Kitchen.. 1 by mouth at bedtime 3)  Celebrex 200 Mg Caps (Celecoxib) .Marland Kitchen.. 1 by mouth once daily as needed per dr Leeanne Deed 4)  Fluticasone Propionate 50 Mcg/act Susp (Fluticasone propionate) .... Take 2 sprays each nostril  for allergy control 5)  Aleve 220 Mg Tabs (Naproxen sodium) 6)  Citalopram Hydrobromide 20 Mg Tabs (Citalopram hydrobromide) .... Take 1/2 by mouth once daily and then can increase to 1 by mouth once daily as  directed  Hypertension Assessment/Plan:      The patient's hypertensive risk group is category B: At least one risk factor (excluding diabetes) with no target organ damage.  Today's blood pressure is 120/80.  Her blood pressure goal is < 140/90.  Patient Instructions: 1)  begin  low dose celexa  and increase to 20 mg  after 1-2 weeks and then increase.    2)  Get medication consult .    About  depression meds and poss Attentional deficit problem.     3)  Dr Andee Poles, Dr Wynonia Lawman   recommended.    4)  call  or rov  in 3-4 weeks  if no appt by then .  Prescriptions: CITALOPRAM HYDROBROMIDE 20 MG TABS (CITALOPRAM HYDROBROMIDE) take 1/2 by mouth once daily and then can increase to 1 by mouth once daily as directed  #30 x 3   Entered and Authorized by:  Madelin Headings MD   Signed by:   Madelin Headings MD on 01/13/2010   Method used:   Electronically to        [facility] [address] [phone]* (retail)       [address]       [address]       [place], [place]  [address]       Ph: [phone]       Fax: [phone]   RxID:   [id number]  greater than 50% of visit spent in counseling  25 minutes  WKP.

## 2010-07-16 ENCOUNTER — Ambulatory Visit (INDEPENDENT_AMBULATORY_CARE_PROVIDER_SITE_OTHER): Payer: 59 | Admitting: Cardiology

## 2010-07-16 DIAGNOSIS — I119 Hypertensive heart disease without heart failure: Secondary | ICD-10-CM

## 2010-07-16 NOTE — Progress Notes (Signed)
Summary: ED Visit from 06/26/10     Rachel Blair, Cousin MRN: [id number] Acct#: [id number] PHYSICIAN DOCUMENTATION SHEET Rachel Blair Jan 13 09:02:18 EST 2012 Rachel Blair. [facility] [address] [place], [place] [address] PHONE: [phone] MRN: [id number] Account #: [id number] Name: Rachel, Blair Sex: F Age: 60 DOB: 1950/12/07 Complaint: Weakness Primary Diagnosis: Adverse drug reaction Arrival Time: 06/26/2010 06:22 Discharge Time: 06/26/2010 09:01 All Providers: Dr. Lorre Nick, MD PROVIDER: Dr. Lorre Nick, MD HPI: The patient is a 60 year old female who presents with a chief complaint of weakness. The history was provided by the patient. pt notes weakness, being jittery, and anxiety since taking adderall 2 days ago--denies chest pain, sob, diaphoresis--nothing makes sx better or worse--pt feels fullness in her neck, but denies dysphagia 07:14 06/26/2010 by Lorre Nick, MD, Dr. Linus Orn: Statement: all systems negative except as marked or noted in the HPI 07:14 06/26/2010 by Lorre Nick, MD, Dr. [facility]: Documentation: physician reviewed/amended Historian: patient Patient's Current Physicians Patient's Current Physicians (please list PCP first) Rachel Blair - IM, Neta Mends Past medical history: hypertension, attention deficit disorder, spinal stenosis Family History: CAD, diabetes, hypertension Surgical History: cholecystectomy, tonsillectomy, tubal ligation Social History: non-smoker, no drug abuse, occasional drinker Contraception: bilateral tubal ligation, menopause Special Needs: none Allergies Drug Reaction Allergy Note NKDA 07:14 06/26/2010 by Lorre Nick, MD, Dr. Physical examination: Vital signs and O2 SAT: reviewed, abnormal, tachycardic 1 Rachel, Blair - MRN: [id number] Acct#: [id number] Constitutional: well developed, well nourished, well hydrated, Uncomfortable appearing Head and Face: normocephalic, atraumatic Eyes: normal appearance, EOMI, PERRL, no scleral  icterus ENMT: ears, nose and throat normal, mouth and pharynx normal Neck: supple Cardiovascular: tachycardia Respiratory: normal Chest: movement normal Abdomen: nondistended Extremities: normal Neuro: AA&Ox3, Cranial Nerves II-XII intact, motor intact in all extremities, sensation normal Skin: no rash Psychiatric: anxious 07:15 06/26/2010 by Lorre Nick, MD, Dr. Reviewed result: Result Type: Cleda Daub: [id number] Step Type: LAB Procedure Name: CBC WITH DIFF Procedure: CBC WITH DIFF Result: WBC COUNT 5.4 K/uL [4.0-10.5] RBC COUNT 4.83 MIL/uL [3.87-5.11] HEMOGLOBIN 14.4 g/dL [42.7-06.2] HEMATOCRIT 42.2 % [36.0-46.0] MCV 87.4 fL [78.0-100.0] MCH 29.8 pg [26.0-34.0] MCHC 34.1 g/dL [37.6-28.3] RDW 15.1 % [11.5-15.5] PLATELET COUNT 241 K/uL [150-400] NEUTROPHIL 49 % [43-77] ABS GRANULOCYTE 2.6 K/uL [1.7-7.7] LYMPHOCYTE 39 % [12-46] ABS LYMPH 2.1 K/uL [0.7-4.0] MONOCYTE 8 % [3-12] ABS MONOCYTE 0.4 K/uL [0.1-1.0] EOSINOPHIL 3 % [0-5] ABS EOS 0.2 K/uL [0.0-0.7] BASOPHIL 1 % [0-1] ABS BASO 0.0 K/uL [0.0-0.1] 07:30 06/26/2010 by Lorre Nick, MD, Dr. Reviewed result: Result Type: Cleda Daub: [id number] Step Type: LAB Procedure Name: [facility] POC 2 Rachel Blair, Rachel Blair MRN: [id number] Acct#: [id number] Procedure: CARDIAC MARKERS, POC Procedure Notes: COMMENT - OF 0.00-0.09 ng/mL SHOW NO INDICATION OF MYOCARDIAL INJURY. PERSISTENTLY INCREASED TROPONIN VALUES IN THE RANGE OF 0.10-0.24 ng/mL CAN BE SEEN IN: -UNSTABLE ANGINA -CONGESTIVE HEART FAILURE -MYOCARDITIS -CHEST TRAUMA -ARRYHTHMIAS -LATE PRESENTING MI -COPD CLINICAL FOLLOW-UP RECOMMENDED. TROPONIN VALUES >=0.25 ng/mL INDICATE POSSIBLE MYOCARDIAL ISCHEMIA. SERIAL TESTING RECOMMENDED. Result: MYOGLOBIN, POC 78.3 ng/mL [12-200] CKMB, POC 1.3 ng/mL [1.0-8.0] TROPONIN I, POC <0.05 ng/mL [0.00-0.09] COMMENT TROPONIN VALUES IN THE RANGE 07:30 06/26/2010 by Lorre Nick, MD, Dr. Reviewed  result: Result Type: Cleda Daub: [id number] Step Type: LAB Procedure Name: BASIC METABOLIC PANEL Procedure: BASIC METABOLIC PANEL Procedure Notes: GFR, Est Afr Am - The eGFR has been calculated using the MDRD equation. This calculation has not been validated in all clinical situations. eGFR's persistently <60 mL/min signify possible Chronic Kidney Disease. Result: SODIUM 139  mEq/L [135-145] POTASSIUM 3.7 mEq/L [3.5-5.1] CHLORIDE 101 mEq/L [96-112] CARBON DIOXIDE 28 mEq/L [19-32] GLUCOSE 149 mg/dL [29-52] H BUN 9 mg/dL [8-41] CREATININE 3.24 mg/dL [4.0-1.0] CALCIUM 9.2 mg/dL [2.7-25.3] GFR, Est Non Af Am 52 mL/min [>60] L GFR, Est Afr Am >60 mL/min [>60] 07:30 06/26/2010 by Lorre Nick, MD, Dr. Cardiac: EKG EKG Time Rate Rhythm Axis PQRS & Intervals ST & T 08:18 AM 90 BPM sinus normal left anterior fascicular block normal 08:18 06/26/2010 by Lorre Nick, MD, Dr. Dalbert Blair - MRN: [id number] Acct#: [id number] ED Course: Comments: ativan 2mg  po given, pt feels better 08:20 06/26/2010 by Lorre Nick, MD, Dr. Patient disposition: Patient disposition: Disch - Home Primary Diagnosis: adverse drug reaction 08:20 06/26/2010 by Lorre Nick, MD, Dr. Medication disposition: Medications Medication [Medication] Dosage Frequency Last Dose Medication disposition PCP contact Adderall Oral 20 mg qd stop hydrochlorothiazide Oral 25 mg qd continue 08:20 06/26/2010 by Lorre Nick, MD, Dr. Discharge: Discharge Instructions: adverse drug effects Append a Note to Discharge Instructions: stop taking the adderal--follow up your doctor Referral/Appointment Refer Patient To: Phone Number: Follow-up in Appointment Details: Rachel Blair [phone] 08:21 06/26/2010 by Lorre Nick, MD, Dr. Milinda Pointer electronically signed by Responsible Physician 08:21 06/26/2010 by Lorre Nick, MD, Dr. Dian Blair

## 2010-07-16 NOTE — Assessment & Plan Note (Signed)
Summary: splinter in finger/njr   Vital Signs:  Patient profile:   60 year old female Menstrual status:  postmenopausal Weight:      214 pounds Temp:     98.5 degrees F oral Pulse rate:   80 / minute BP sitting:   120 / 80  (left arm) Cuff size:   regular  Vitals Entered By: Romualdo Bolk, CMA (AAMA) (June 09, 2010 12:59 PM) CC: Splinter in rt index finger x 2 weeks.    History of Present Illness: Rachel Blair comes in today  for acute visit for FB in her righ tindex finger.   thinks it could be a spinter ads occuured when liftin boxes.  . ? if go t something out.  since then she has tried to get something out and soaked finger at times.  is bothersome and still feels tender and  painful  at times. no fever pus  or drainage.   Preventive Screening-Counseling & Management  Alcohol-Tobacco     Alcohol drinks/day: <1     Alcohol type: wine     Smoking Status: quit     Year Quit: 2000     Pack years: 1 ppd x 20 years     Tobacco Counseling: not to resume use of tobacco products  Caffeine-Diet-Exercise     Caffeine use/day: 3     Does Patient Exercise: yes     Type of exercise: swimming and walking  Current Medications (verified): 1)  Hydrochlorothiazide 25 Mg Tabs (Hydrochlorothiazide) .Marland Kitchen.. 1 By Mouth Once Daily 2)  Zolpidem Tartrate 10 Mg Tabs (Zolpidem Tartrate) .Marland Kitchen.. 1 By Mouth At Bedtime 3)  Celebrex 200 Mg Caps (Celecoxib) .Marland Kitchen.. 1 By Mouth Once Daily As Needed Per Dr Leeanne Deed 4)  Fluticasone Propionate 50 Mcg/act Susp (Fluticasone Propionate) .... Take 2 Sprays Each Nostril  For Allergy Control 5)  Aleve 220 Mg Tabs (Naproxen Sodium) 6)  Citalopram Hydrobromide 20 Mg Tabs (Citalopram Hydrobromide) .... Take 1/2 By Mouth Once Daily and Then Can Increase To 1 By Mouth Once Daily As Directed  Allergies (verified): 1)  * Vicodin 2)  * Avinza 3)  Cymbalta (Duloxetine Hcl)  Past History:  Past medical, surgical, family and social histories (including risk factors)  reviewed for relevance to current acute and chronic problems.  Past Medical History: Reviewed history from 12/22/2009 and no changes required. 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  Past Surgical History: Reviewed history from 10/13/2007 and no changes required. Cholecystectomy Tonsillectomy Tubal ligation Left thumb trigger finger release Biopsy and bone graft of left maxillary sinus odontogenic cyst  Past History:  Care Management: Cardiology: [facility] Pulmonary: Sood Podiatry: [facility]. Sleep:    Family History: Reviewed history from 12/27/2008 and no changes required.  Father - emphysema, stroke, chf  Mother - heart disease Brother - lung cancer  Brother - snoring Daughter: RA    Social History: Reviewed history from 10/22/2009 and no changes required. 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         no joint pain or swelling  Physical Exam  General:  Well-developed,well-nourished,in no acute distress; alert,appropriate and cooperative throughout examination Extremities:  righ index finger with 2-3 mmcalloused appearing area  with midl redness and swelling no abscess or drainage and center red dots   . gentle superficial manipulation shows nothing or fb within the first 2 mm  .   nl rom finger Neurologic:  non focal  Skin:  see ext exam   Impression & Recommendations:  Problem # 1:  FOREIGN BODY, FINGER (ICD-915.6) cannot see anything with superficial  manipulation and  may be walled off cause of length of time  since  event.      disc options .. will get ortho hand to see  for opinion   on further  managment.       has appt Dr Orlan Leavens today  .  Complete Medication List: 1)  Hydrochlorothiazide 25 Mg Tabs (Hydrochlorothiazide) .Marland Kitchen.. 1 by mouth once daily 2)  Zolpidem Tartrate 10 Mg Tabs (Zolpidem tartrate) .Marland Kitchen.. 1 by mouth at bedtime 3)  Celebrex 200 Mg Caps (Celecoxib) .Marland Kitchen.. 1 by mouth once daily as needed per dr Leeanne Deed 4)  Fluticasone Propionate 50 Mcg/act Susp (Fluticasone propionate) .... Take 2 sprays each nostril  for allergy control 5)  Aleve 220 Mg Tabs (Naproxen sodium) 6)  Citalopram Hydrobromide 20 Mg Tabs (Citalopram hydrobromide) .... Take 1/2 by mouth once daily and then can increase to 1 by mouth once daily as directed  Other Orders: Tdap => 6yrs IM (04540) Admin 1st Vaccine (98119) Orthopedic Referral (Ortho)   Orders Added: 1)  Tdap => 34yrs IM [90715] 2)  Admin 1st Vaccine [90471] 3)  Orthopedic Referral [Ortho] 4)  Est. Patient Level III [14782]   Immunizations Administered:  Tetanus Vaccine:    Vaccine Type: Tdap    Site: left deltoid    Mfr: GlaxoSmithKline    Dose: 0.5 ml    Route: IM    Given by: Romualdo Bolk, CMA (AAMA)    Exp. Date: 04/02/2012    Lot #: [id number]   Immunizations Administered:  Tetanus Vaccine:    Vaccine Type: Tdap    Site: left deltoid    Mfr: GlaxoSmithKline    Dose: 0.5 ml    Route: IM    Given by: Romualdo Bolk, CMA (AAMA)    Exp. Date: 04/02/2012    Lot #: [id number]

## 2010-07-16 NOTE — Letter (Signed)
Summary: [facility]   Imported By: Maryln Gottron 06/22/2010 15:10:15  _____________________________________________________________________  External Attachment:    Type:   Image     Comment:   External Document

## 2010-07-22 NOTE — Progress Notes (Signed)
----   Converted from flag ---- ---- 07/12/2010 10:53 AM, Madelin Headings MD wrote: What meds did Dr Evelene Croon put her on   .. (I saw and ed visit that she had a se of adderrall.  ) ------------------------------      New Allergies: ! ADDERALL (AMPHETAMINE-DEXTROAMPHETAMINE) New/Updated Medications: RITALIN 20 MG TABS (METHYLPHENIDATE HCL) 1/2 by mouth once daily per Dr. Evelene Croon New Allergies: ! ADDERALL (AMPHETAMINE-DEXTROAMPHETAMINE)  Left message to call back. Romualdo Bolk, CMA (AAMA)  July 13, 2010 10:57 AM  Spoke with pt and she was given Adderall but it made her feel like she was coming out of her skin. Pt was given Ritalin but she is going to wait until she talks to Dr. Thomos Lemons before starting it. Romualdo Bolk, CMA (AAMA)  July 13, 2010 2:09 PM

## 2010-07-30 ENCOUNTER — Ambulatory Visit (INDEPENDENT_AMBULATORY_CARE_PROVIDER_SITE_OTHER): Payer: 59 | Admitting: *Deleted

## 2010-07-30 DIAGNOSIS — I10 Essential (primary) hypertension: Secondary | ICD-10-CM

## 2010-09-28 ENCOUNTER — Other Ambulatory Visit: Payer: Self-pay | Admitting: Internal Medicine

## 2010-09-29 NOTE — Telephone Encounter (Signed)
Per Dr. Fabian Sharp- Pt needs to have Dr. Evelene Croon refill this rx. I spoke with pt's husband and to him that pt needs to have Dr. Evelene Croon refill this rx.

## 2010-10-01 ENCOUNTER — Telehealth: Payer: Self-pay | Admitting: *Deleted

## 2010-10-01 MED ORDER — ZOLPIDEM TARTRATE 10 MG PO TABS: 10.0000 mg | ORAL_TABLET | Freq: Every evening | ORAL | Status: DC | PRN

## 2010-10-01 NOTE — Telephone Encounter (Signed)
Rx called in and letter mailed to pt.

## 2010-10-01 NOTE — Telephone Encounter (Signed)
Refill x1 then ROV  For med check

## 2010-10-01 NOTE — Telephone Encounter (Signed)
Pt wants Dr. Fabian Sharp to know that she is no longer seeing the pyschiatrist, and needs Dr. Fabian Sharp to call in the Ambien to [facility] at [facility].

## 2010-10-21 ENCOUNTER — Encounter: Payer: Self-pay | Admitting: Internal Medicine

## 2010-10-22 ENCOUNTER — Encounter: Payer: Self-pay | Admitting: Internal Medicine

## 2010-10-22 ENCOUNTER — Ambulatory Visit (INDEPENDENT_AMBULATORY_CARE_PROVIDER_SITE_OTHER): Payer: 59 | Admitting: Internal Medicine

## 2010-10-22 VITALS — BP 140/80 | HR 66 | Wt 212.0 lb

## 2010-10-22 DIAGNOSIS — G4700 Insomnia, unspecified: Secondary | ICD-10-CM

## 2010-10-22 DIAGNOSIS — R41840 Attention and concentration deficit: Secondary | ICD-10-CM

## 2010-10-22 DIAGNOSIS — T50995A Adverse effect of other drugs, medicaments and biological substances, initial encounter: Secondary | ICD-10-CM

## 2010-10-22 DIAGNOSIS — E669 Obesity, unspecified: Secondary | ICD-10-CM

## 2010-10-22 DIAGNOSIS — R300 Dysuria: Secondary | ICD-10-CM | POA: Insufficient documentation

## 2010-10-22 DIAGNOSIS — I10 Essential (primary) hypertension: Secondary | ICD-10-CM

## 2010-10-22 LAB — POCT URINALYSIS DIPSTICK
Bilirubin, UA: NEGATIVE
Ketones, UA: NEGATIVE
pH, UA: 7

## 2010-10-22 MED ORDER — ZOLPIDEM TARTRATE 10 MG PO TABS: 10.0000 mg | ORAL_TABLET | Freq: Every evening | ORAL | Status: DC | PRN

## 2010-10-22 NOTE — Progress Notes (Signed)
Subjective:    Patient ID: Rachel Blair, female    DOB: 1951/05/26, 60 y.o.   MRN: [id number]  HPI Patient comes in today for fu of a number of issues .  Specifically med eval and refill .Since her last visit  She has seen byDr Evelene Croon.   Tried meds for ADHD. And had se of meds .     And didn't help  much after awhile.       Takes ambien  Every night  And has pain with OA hands and joints mostly that interfere with sleep.   Has nausea with cymbalta Use tylenol and  aleve and advil.   celebrex has also helped in the past also left shoulder bothers her at times. NO swelling redness or fevers or extra am stiffness Daughter   Dx with RA.    .       Problem with urine for a months. NOt more  Often  NO burning.  Has a bad smell.    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 Due for check up.  Review of Systems Neg for cp sob fever weight loss neuro sx .  Sees Dr Patty Sermons. Reg NOt smoking. Rest see hpi   Past Medical History  Diagnosis Date  . OSA (obstructive sleep apnea)     moderatley severe  . Chest pain     atypical  . Hypertension   . GERD (gastroesophageal reflux disease)   . Allergy   . Nasal septal deviation   . Odontogenic cyst     of left maxillary sinus  . History of pneumonia    Past Surgical History  Procedure Date  . Cholecystectomy   . Tonsillectomy   . Tubal ligation   . Left thumb trigger finger release   . Biopsy and bone graft of left maxillary sinus odontogenic cyst     reports that she has quit smoking. She does not have any smokeless tobacco history on file. She reports that she drinks alcohol. She reports that she does not use illicit drugs. family history includes Emphysema in her father; Heart disease in her mother; Heart failure in her father; Lung cancer in her brother; Rheum arthritis in her daughter; Snoring in her brother; and Stroke in her father. Allergies  Allergen Reactions  . Adderall     REACTION: Coming out of  skin  . Duloxetine     REACTION: Sleepy and nausea  . Hydrocodone-Acetaminophen     REACTION: abdominal pain  . Morphine Sulfate     REACTION: abdominal pain       Objective:   Physical Exam WDWN in nad  HEENT no acute changes  NEck no masses of bruits  Chest:  Clear to A&P without wheezes rales or rhonchi CV:  S1-S2 no gallops or murmurs peripheral perfusion is normal Abdomen:  Sof,t normal bowel sounds without hepatosplenomegaly, no guarding rebound or masses no CVA tenderness Ext : No clubbing cyanosis   Slight  edema  Neuro intact  Non focal  Gait normal .  MS no acute effusion or deformity . \     See UA  Assessment & Plan:  Sleep disturbance  On meds    prob dependent at present.  But helps   Pain interferes with sleep but cymbalta caused se  Poss add   Had se of meds  Currently off. Joint pains prob oa  Although daughter has ra.  Urinary sx change  Poss uti  With mild sx . Will cx and treat if positive . Mood stable at present.

## 2010-10-22 NOTE — Patient Instructions (Addendum)
Check up in September  With labs  If arthritis is getting worse call and we can consider   Consult.   Will notify you about  Urine culture

## 2010-10-23 ENCOUNTER — Telehealth: Payer: Self-pay | Admitting: Internal Medicine

## 2010-10-23 NOTE — Telephone Encounter (Signed)
Pt aware that results are not in yet but that we will call her as soon as they come in.

## 2010-10-23 NOTE — Telephone Encounter (Signed)
Pt called to get lab/culture results. Pls call pt asap.

## 2010-10-26 ENCOUNTER — Other Ambulatory Visit: Payer: Self-pay | Admitting: *Deleted

## 2010-10-26 MED ORDER — CIPROFLOXACIN HCL 500 MG PO TABS: 500.0000 mg | ORAL_TABLET | Freq: Two times a day (BID) | ORAL | Status: DC

## 2010-10-27 MED ORDER — CIPROFLOXACIN HCL 500 MG PO TABS: 500.0000 mg | ORAL_TABLET | Freq: Two times a day (BID) | ORAL | Status: DC

## 2010-10-27 NOTE — Telephone Encounter (Signed)
Addended by: Tor Netters on: 10/27/2010 08:40 AM   Modules accepted: Orders

## 2010-10-30 NOTE — Procedures (Signed)
Rachel Blair, Rachel Blair NO.:  [id number]   MEDICAL RECORD NO.:  [id number]          PATIENT TYPE:  OUT   LOCATION:  [facility]                          FACILITY:  [facility]   PHYSICIAN:  Erick Colace, M.D.DATE OF BIRTH:  [birth year]   DATE OF PROCEDURE:  10/04/2005  DATE OF DISCHARGE:  09/21/2005                                 OPERATIVE REPORT   PROCEDURES:  Bilateral lumbar L5 dorsal ramus injection, bilateral L4 and L3  median branch blocks under fluoroscopic guidance.   OPERATOR:  Erick Colace, M.D.   INDICATIONS FOR PROCEDURE:  Lumbar spondylosis with probable facet  arthropathy.  She does not tolerate narcotic analgesics and only getting  partial relief from non-steroidal anti-inflammatories and muscle relaxors.  She has pain that interferes with general activity.   INFORMED CONSENT:  An informed consent was obtained after describing the  risks and benefits of the procedure with the patient.  These include  bleeding, bruising, infection, loss of bowel and bladder function, temporary  or permanent paralysis.  She elects to proceed and has given written  consent.   DESCRIPTION OF PROCEDURE:  The patient is placed prone on the fluoroscopy  table.  Betadine prep and a sterile drape.  A #25 gauge, 1.5 inch needle was  used to anesthetize the skin and subcutaneous tissues with 1% lidocaine 2 mL  at each site.  A #22 gauge, 3.5 inch spinal needle was inserted, first  charting the left S1/SAP sacroiliac junction.  Bone contact made and  conferred with lateral imaging.  Omnipaque 180, 0.5 mL demonstrated no intra-  vascular uptake, and then 0.5 mL of a solution containing 1 mL of 40 mg per  mL of Kenalog plus 2 mL of 2% methylparaben-free lidocaine was injected.  Next the right S1/SAP sacroiliac junction targeted and bone contact made and  confirmed with lateral imaging.  Omnipaque 180, x 0.5 mL demonstrated no  intra-vascular uptake.  Then 0.5 mL of Depo-Medrol  and lidocaine solution  was injected.  Next the right L5/SAP transverse process junction targeted.  Bone contact made and confirmed with lateral imaging.  Omnipaque 180 x 0.5  mL demonstrated no intra-vascular uptake.  Then 0.5 mm of Depo-Medrol and  lidocaine solution was injected.  Next, the right L4-SAP transverse process  junction and bone contact made and confirmed with lateral imaging.  Omnipaque 180 x 0.5 mL demonstrated no intra-vascular uptake, and 0.5 mL of  Depo-Medrol and lidocaine solution was injected.  Next the C-arm with  oblique to the left 10 degrees and a #22 gauge, 3.5 inch spinal needle was  inserted, targeting the left L4/SAP transverse process junction.  Bone  contact made and confirmed with lateral imaging.  Omnipaque 180 x 0.5 mL  demonstrated no intra-vascular uptake.  Then 0.5 mL of Depo-Medrol and  lidocaine solution was injected.  Last the left L5/SAP transverse process  junction targeted.  Bone contact made and confirmed with lateral imaging.  Omnipaque 180 x 0.5 mL demonstrated no intra-vascular uptake.  Then 0.5 mL  of Depo-Medrol and lidocaine solution was injected.  The patient tolerated the procedure well.  Post-injection instructions  given.  Return in one month with probable re-injection if she has gotten at  least 50% pain relief even temporarily with this injection.  She will be  given a pain diary.  Her pre and post vital signs are stable.  Her pre and  post pain levels are from 4 to a 2.      Erick Colace, M.D.  Electronically Signed     AEK/MEDQ  D:  10/04/2005 12:02:27  T:  10/05/2005 14:01:24  Job:  [id number]   cc:   Cristi Loron, M.D.  Fax: [phone]

## 2010-10-30 NOTE — Op Note (Signed)
[facility]  Patient:    Rachel Blair, Rachel Blair                           MRN: [id number] Proc. Date: 10/27/99 Adm. Date:  [id number] Attending:  Sypher, Douglass Rivers CC:         Katy Fitch. Sypher, Montez Hageman., M.D. (2)                           Operative Report  PREOPERATIVE DIAGNOSIS:  Locked stenosing tenosynovitis, right thumb.  POSTOPERATIVE DIAGNOSIS:  Locked stenosing tenosynovitis, right thumb.  OPERATION PERFORMED:  Release of right thumb A1 pulley.  SURGEON:  Katy Fitch. Sypher, Montez Hageman., M.D.  ASSISTANT:  None.  ANESTHESIA:  0.25% Marcaine and 2% lidocaine metacarpal head level block right thumb, supplemented by IV sedation.  SUPERVISING ANESTHESIOLOGIST:  Dr. Zoila Shutter.  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  She did not respond to activity modification, anti-inflammatory medication and work modification.  Due to failure to respond to nonoperative management, the patient is brought to the operating room at this time for release of her right thumb A-1 pulley.  DESCRIPTION OF PROCEDURE:  [name] was brought to the operating room and placed in supine position on the operating table.  Following light sedation, the right arm was prepped with Betadine soap and solution and sterilely draped.  0.25% Marcaine and 2% lidocaine were infiltrated at metacarpal head level.  Following exsanguination of the limb with an Esmarch bandage, the arterial tourniquet was inflated to 240 mmHg.  The procedure commenced with a short transverse incision in the proximal palm flexion crease.  The subcutaneous tissues were carefully divided taking care to identify the radial proper digital nerve.  This was gently retracted and the A-1 pulley isolated.  The pulley was split with scissors.  A small fascial band proximally was released with scissors.  Thereafter, the tendon was delivered  and found to have considerable swelling.  Full active range of motion of the thumb was recovered.  The wound was then repaired with mattress sutures of 5-0 nylon.  There were no apparent complications.  [name] was awakened from sedation and transferred to recovery room with stable vital signs.  For aftercare she was given a prescription for Tylenol with codeine #3 one or two tablets p.o. q.4-6h. p.r.n. pain.  She will return to our office and will follow up in one week or sooner p.r.n. problems. DD:  10/27/99 TD:  10/28/99 Job: [id number]

## 2010-10-30 NOTE — Op Note (Signed)
NAMEHART, WRISLEY NO.:  [id number]   MEDICAL RECORD NO.:  [id number]          PATIENT TYPE:  AMB   LOCATION:  DAY                          FACILITY:  [facility]   PHYSICIAN:  Lorre Munroe., M.D.DATE OF BIRTH:  November 20, 1950   DATE OF PROCEDURE:  01/25/2005  DATE OF DISCHARGE:                                 OPERATIVE REPORT   PREOPERATIVE DIAGNOSIS:  Symptomatic cholelithiasis.   POSTOPERATIVE DIAGNOSIS:  Symptomatic cholelithiasis.   OPERATION:  Laparoscopic cholecystectomy.   SURGEON:  Lebron Conners, M.D.   ANESTHESIA:  General and local.   SPECIMEN:  Gallbladder.   BLOOD LOSS:  Minimal.   COMPLICATIONS:  None.   CONDITION:  To PACU good.   DESCRIPTION OF PROCEDURE:  After the patient was monitored and had general  anesthesia and routine preparation and draping of the abdomen, I infiltrated  local anesthetic just below the umbilicus and made a short transverse  incision and then worked down to the fascia and incised it longitudinally  for about 2 cm in the midline. I then bluntly opened the peritoneal cavity,  put in my finger to be sure there were no viscera adherent to the anterior  abdominal wall and put in a Hassan cannula secured by a #0 Vicryl  pursestring suture in the fascia. After inflating the abdomen with carbon  dioxide, I looked around with the laparoscopic and saw no abnormalities  except for a slightly inflamed appearing gallbladder with some adhesions of  omentum to the undersurface. I then anesthetized three additional port sites  and put in an 11 mm epigastric port and two 5 mm mid abdominal ports under  direct view of the laparoscopic. With the patient positioned head-up foot  down and tilted toward the left, I grasped the fundus of the gallbladder and  retracted it toward the right shoulder. I then took down the adhesions  bluntly and with cautery and visualized the infundibulum of the gallbladder  which I grasped and retracted  to the right. I then dissected the  hepatoduodenal ligament and made a large window between the liver and the  cystic duct and cystic artery. I could clearly see the cystic duct emerging  from the infundibulum of the gallbladder and entering the common bile duct.  I could clearly see the cystic artery traversing the triangle of Calot. I  clipped the cystic duct and cystic artery with four clips each and cut each  structure between the two clips closest to the gallbladder. That then  provided excellent mobility of the gallbladder and I dissected the anterior  and posterior peritoneum using cautery and gained hemostasis through the  entire procedure with the cautery. Blood loss was essentially nil. Just  before detachment of the gallbladder, I inspected the clips and saw the  three clips on each structure were very secure and that there was no  bleeding in the area. I then detached the gallbladder from the liver and  removed it through the umbilical incision and tied the pursestring suture. I  removed the two lateral ports under direct view and saw  no bleeding from the  abdominal wall. I then allowed this carbon dioxide to escape through the  epigastric port and removed it as well. I closed all skin incisions with  intracuticular 4-0 Vicryl and Steri-Strips. She tolerated the operation  without any problems.      Lorre Munroe., M.D.  Electronically Signed     WB/MEDQ  D:  01/25/2005  T:  01/25/2005  Job:  [id number]

## 2010-10-30 NOTE — Assessment & Plan Note (Signed)
This is a follow-up appointment.  She had bilateral lumbar L5 dorsal ramus  injections, L4 and L3 medial branch blocks under fluoroscopic guidance October 04, 2005.  She did not have any significant relief except for immediately  post procedure where she went from a 4 to a 2.   She has pain both in the lumbar area as well as in the buttocks area right  greater than left.  She has no significant pain going down her legs.   She has had no change in her pain since last visit.  She has some pain in  her neck area as well.  Her current medications include Aleve which she  takes two tablets in the morning and two tablets at night.  In addition, she  takes Tylenol Arthritis two tablets in the morning and two tablets at night  and Limbrel one p.o. b.i.d.  No significant difference on Linbrel.  She  takes Flexeril 10 q.h.s.  She gets stiff when she wakes up in the morning.   REVIEW OF SYSTEMS:  Noncontributory.   CURRENT FUNCTIONAL LEVEL:  She is independent with all self care,  mobilities, but exercises less than she used to.  Has tried doing some yard  work but this causes some exacerbation of pain.  She continues to work 45  hours a week.  Her sleep is fair.   PHYSICAL EXAMINATION:  GENERAL:  No acute distress.  Mood and affect  appropriate.  BACK:  Mild tenderness to palpation right PSIS.  Faber's is causing some  pain in the buttock area on the right side.  Has pain with both forward  flexion and extension of her lumbar spine.  NECK:  She has pain with range of motion of her neck, however, full range of  motion.  NEUROLOGIC:  She has full strength bilateral upper and lower extremities, no  sensory changes.   IMPRESSION:  1.  Lumbar axial pain.  Does not appear to be facet-related unless she only      got a local response.  Other structures in the area of her pain      complaints would be sacroiliac joints.  2.  Likely cervical spondylosis causing neck pain.  May have some myofascial     pain component as well.   PLAN:  1.  Will change Flexeril to 5 mg t.i.d.  2.  Discontinue Linbrel.  Start Naprosyn 500 b.i.d.  Not to take any Aleve      or any ibuprofen with this.   I will see her back for sacroiliac injections.      Erick Colace, M.D.  Electronically Signed     AEK/MedQ  D:  11/18/2005 16:43:25  T:  11/19/2005 13:41:05  Job #:  [id number]

## 2010-10-30 NOTE — Group Therapy Note (Signed)
REFERRING PHYSICIAN:  Dr. Tressie Stalker   HISTORY:  A 60 year old female with a 3-year history of insidious onset neck  and low back pain.  She cannot really tell me which bothers her more  although she reaches for her low back when she describes her pain.  She  denies any trauma.  She has used ibuprofen at first and then now is on two  Aleve b.i.d.  She states her average pain is 3/10 and interferes with  general activity at 4/10 level but increases with sitting in one spot.  Her  relief from medications is fair.  She has tried a TENS which gives her some  relief but it is difficult to make them sure she is putting the electrodes  in the right position.  She states PT has worked with her some on this but  still not clear.   FUNCTIONAL HISTORY:  Independent with all ADLs, mobility, she drives, climb  steps and is employed 40+ hours per week.  She is married, lives with her  husband.   CURRENT MEDICATIONS:  Current meds include Aleve two p.o. b.i.d.,  hydrochlorothiazide 25 a day, Flexeril 10 at bedtime, Ambien 10 one half at  bedtime, Skelaxin 800 one to two per day.   MEDICATIONS ALLERGIES:  AVINZA caused severe abdominal cramping.   REVIEW OF SYSTEMS:  Positive for depression but denies any suicidal  thoughts.   SURGICAL HISTORY:  Tonsillectomy 1955, tubal ligation 1998, gallbladder  removal in 2006.   EXAMINATION:  Blood pressure 130/79, pulse 102, respiratory rate 16, O2  saturation 97% room air.   In general, no acute distress.  Mood and affect appropriate.  She is alert  and oriented x3.  Gait is normal.   Her exam has negative straight leg raise, negative FABER's other than  causing some pain in her low back.  She has full strength bilateral lower  extremities, normal sensation and normal deep tendon reflexes bilateral  upper and lower extremities.  Normal upper extremity range of motion as  well, no fasciculations, no muscle wasting.  Her body habitus mildly  obese.   Her back range of motion has pain with extension greater than with flexion.   IMAGING STUDIES:  I do have reports from [facility].  She has borderline central  stenosis L4-5 as well as mild bilateral stenosis L4-5.  She was noted to  have mild facet overgrowth L3-4.  Facet degeneration was noted L4-5, L5-S1  on lumbar plane films done February 11, 2004.  She has also had a C-spine MRI  demonstrating facet overgrowth C2-3 as well as a lateral central  stenosis/spondylosis C5-6, C6-7.  Once again, I have not actually seen the  films.   IMPRESSION AND PLAN:  1.  Probable lumbar facet arthropathy causing low back pain.  Overall I      think this is a degenerative process and I think anti-inflammatory is      reasonable however, she has been on current dose for a while, she is      also concerned about side effects.  To that affect, we will give a trial      of Limbrel 500 b.i.d.  2.  Lidoderm patch up to three (on 12/off 12) cervical, thoracic and lumbar      areas.  3.  PT four visits to review TENS unit utilization.  4.  Lumbar facet injections.  I think this would aid in diagnosis as well as      treatment.  We will do lumbar first.  If successful may need to proceed on to lumbar  radiofrequency neurotomy and then consider same for cervical area.  Thanks  for the referral.      Erick Colace, M.D.  Electronically Signed     AEK/MedQ  D:  09/03/2005 14:39:17  T:  09/04/2005 20:26:53  Job #:  [id number]   cc:   Cristi Loron, M.D.  Fax: [phone]

## 2010-11-30 ENCOUNTER — Other Ambulatory Visit: Payer: Self-pay | Admitting: Dermatology

## 2011-01-19 ENCOUNTER — Ambulatory Visit (INDEPENDENT_AMBULATORY_CARE_PROVIDER_SITE_OTHER): Payer: 59

## 2011-01-19 ENCOUNTER — Inpatient Hospital Stay (INDEPENDENT_AMBULATORY_CARE_PROVIDER_SITE_OTHER)
Admission: RE | Admit: 2011-01-19 | Discharge: 2011-01-19 | Disposition: A | Discharge status: Home / Self Care | Payer: 59 | Source: Ambulatory Visit | Attending: Family Medicine | Admitting: Family Medicine

## 2011-01-19 DIAGNOSIS — R229 Localized swelling, mass and lump, unspecified: Secondary | ICD-10-CM

## 2011-01-19 DIAGNOSIS — S8010XA Contusion of unspecified lower leg, initial encounter: Secondary | ICD-10-CM

## 2011-01-25 ENCOUNTER — Other Ambulatory Visit: Payer: Self-pay | Admitting: Internal Medicine

## 2011-01-25 ENCOUNTER — Telehealth: Payer: Self-pay | Admitting: *Deleted

## 2011-01-25 DIAGNOSIS — Z1231 Encounter for screening mammogram for malignant neoplasm of breast: Secondary | ICD-10-CM

## 2011-01-25 NOTE — Telephone Encounter (Signed)
Pt is asking for a prescription for Ativan for air travel leaving Aug. 30, 2012. [facility])

## 2011-01-27 MED ORDER — LORAZEPAM 0.5 MG PO TABS: ORAL_TABLET | ORAL | Status: AC

## 2011-01-27 NOTE — Telephone Encounter (Signed)
Per Dr. Fabian Sharp- ativan 0.5mg  #15 1-2 pre flight. No refills. Rx called in.

## 2011-02-08 ENCOUNTER — Inpatient Hospital Stay (INDEPENDENT_AMBULATORY_CARE_PROVIDER_SITE_OTHER)
Admission: RE | Admit: 2011-02-08 | Discharge: 2011-02-08 | Disposition: A | Discharge status: Home / Self Care | Payer: 59 | Source: Ambulatory Visit | Attending: Family Medicine | Admitting: Family Medicine

## 2011-02-08 DIAGNOSIS — S8010XA Contusion of unspecified lower leg, initial encounter: Secondary | ICD-10-CM

## 2011-02-24 ENCOUNTER — Ambulatory Visit (HOSPITAL_COMMUNITY)
Admission: RE | Admit: 2011-02-24 | Discharge: 2011-02-24 | Disposition: A | Discharge status: Home / Self Care | Payer: 59 | Source: Ambulatory Visit | Attending: Internal Medicine | Admitting: Internal Medicine

## 2011-02-24 DIAGNOSIS — Z1231 Encounter for screening mammogram for malignant neoplasm of breast: Secondary | ICD-10-CM | POA: Insufficient documentation

## 2011-02-26 ENCOUNTER — Encounter: Payer: Self-pay | Admitting: Internal Medicine

## 2011-02-26 ENCOUNTER — Ambulatory Visit (INDEPENDENT_AMBULATORY_CARE_PROVIDER_SITE_OTHER): Payer: 59 | Admitting: Internal Medicine

## 2011-02-26 ENCOUNTER — Other Ambulatory Visit (HOSPITAL_COMMUNITY)
Admission: RE | Admit: 2011-02-26 | Discharge: 2011-02-26 | Disposition: A | Discharge status: Home / Self Care | Payer: 59 | Source: Ambulatory Visit | Attending: Internal Medicine | Admitting: Internal Medicine

## 2011-02-26 VITALS — BP 130/80 | HR 72 | Ht 63.75 in | Wt 211.0 lb

## 2011-02-26 DIAGNOSIS — R350 Frequency of micturition: Secondary | ICD-10-CM

## 2011-02-26 DIAGNOSIS — R8290 Unspecified abnormal findings in urine: Secondary | ICD-10-CM | POA: Insufficient documentation

## 2011-02-26 DIAGNOSIS — Z01419 Encounter for gynecological examination (general) (routine) without abnormal findings: Secondary | ICD-10-CM | POA: Insufficient documentation

## 2011-02-26 DIAGNOSIS — G4733 Obstructive sleep apnea (adult) (pediatric): Secondary | ICD-10-CM

## 2011-02-26 DIAGNOSIS — J309 Allergic rhinitis, unspecified: Secondary | ICD-10-CM

## 2011-02-26 DIAGNOSIS — E785 Hyperlipidemia, unspecified: Secondary | ICD-10-CM

## 2011-02-26 DIAGNOSIS — G4700 Insomnia, unspecified: Secondary | ICD-10-CM

## 2011-02-26 DIAGNOSIS — I10 Essential (primary) hypertension: Secondary | ICD-10-CM

## 2011-02-26 DIAGNOSIS — R82998 Other abnormal findings in urine: Secondary | ICD-10-CM

## 2011-02-26 DIAGNOSIS — Z0000 Encounter for general adult medical examination without abnormal findings: Secondary | ICD-10-CM

## 2011-02-26 DIAGNOSIS — F4321 Adjustment disorder with depressed mood: Secondary | ICD-10-CM

## 2011-02-26 DIAGNOSIS — E669 Obesity, unspecified: Secondary | ICD-10-CM

## 2011-02-26 LAB — POCT URINALYSIS DIPSTICK
Bilirubin, UA: NEGATIVE
Glucose, UA: NEGATIVE
Ketones, UA: NEGATIVE
Protein, UA: NEGATIVE

## 2011-02-26 LAB — LIPID PANEL
Cholesterol: 203 mg/dL — ABNORMAL HIGH (ref 0–200)
Total CHOL/HDL Ratio: 4
Triglycerides: 176 mg/dL — ABNORMAL HIGH (ref 0.0–149.0)
VLDL: 35.2 mg/dL (ref 0.0–40.0)

## 2011-02-26 LAB — BASIC METABOLIC PANEL
BUN: 16 mg/dL (ref 6–23)
CO2: 28 mEq/L (ref 19–32)
Calcium: 9.2 mg/dL (ref 8.4–10.5)
Chloride: 100 mEq/L (ref 96–112)
Creatinine, Ser: 1 mg/dL (ref 0.4–1.2)

## 2011-02-26 LAB — CBC WITH DIFFERENTIAL/PLATELET
Basophils Absolute: 0 10*3/uL (ref 0.0–0.1)
Eosinophils Absolute: 0.2 10*3/uL (ref 0.0–0.7)
HCT: 45.6 % (ref 36.0–46.0)
Lymphs Abs: 2.3 10*3/uL (ref 0.7–4.0)
MCV: 89.3 fl (ref 78.0–100.0)
Monocytes Absolute: 0.5 10*3/uL (ref 0.1–1.0)
Neutrophils Relative %: 49.2 % (ref 43.0–77.0)
Platelets: 238 10*3/uL (ref 150.0–400.0)
RDW: 14.1 % (ref 11.5–14.6)

## 2011-02-26 LAB — HEPATIC FUNCTION PANEL
Bilirubin, Direct: 0.1 mg/dL (ref 0.0–0.3)
Total Bilirubin: 0.9 mg/dL (ref 0.3–1.2)

## 2011-02-26 LAB — LDL CHOLESTEROL, DIRECT: Direct LDL: 121.8 mg/dL

## 2011-02-26 MED ORDER — SULFAMETHOXAZOLE-TMP DS 800-160 MG PO TABS: 1.0000 | ORAL_TABLET | Freq: Two times a day (BID) | ORAL | Status: AC

## 2011-02-26 MED ORDER — ZOLPIDEM TARTRATE 10 MG PO TABS: 10.0000 mg | ORAL_TABLET | Freq: Every evening | ORAL | Status: DC | PRN

## 2011-02-26 NOTE — Progress Notes (Signed)
Subjective:    Patient ID: Rachel Blair, female    DOB: 07-Apr-1951, 60 y.o.   MRN: [id number]  HPI Patient comes in today for preventive visit and follow-up of medical issues. Update of her history since her last visit. Dog hit side of leg and had injury and seen at urgent care  Right  Recovering .  No hospitalization working 2 days per week.  Blood pressure has been okay Sleep: Still using Ambien which helped OSA: Not using CPAP patient states she is aware of risk. Mood; tends to get to be a problem mostly in the winter when there is l last day like light is not on Celexa now but uses it in season Takes as needed Tylenol for her arthritis Review of Systems ROS:  GEN/ HEENTNo fever, significant weight changes sweats headaches vision problems hearing changes, CV/ PULM; No chest pain cough, syncope,edema  HAS SOME DYSPNEA OR LIGHTHEADED WHEN gardening no cough or waling intolerance and no CP  GI /GU: No adominal pain, vomiting, change in bowel habits. No blood in the stool. No significant GU symptoms. SKIN/HEME: ,no acute skin rashes suspicious lesions or bleeding. No lymphadenopathy, nodules, masses.  NEURO/ PSYCH:  No neurologic signs such as weakness numbness  IMM/ Allergy: No unusual infections.  Allergy .   REST of 12 system review negative except as per hpi      Objective:   Physical Exam Physical Exam: Vital signs reviewed VZD:GLOV is a well-developed well-nourished alert cooperative  white female who appears her stated age in no acute distress.  HEENT: normocephalic  traumatic , Eyes: PERRL EOM's full, conjunctiva clear, Nares: paten,t no deformity discharge or tenderness., Ears: no deformity EAC's clear TMs with normal landmarks. Mouth: clear OP, no lesions, edema.  Moist mucous membranes. Dentition in adequate repair. NECK: supple without masses, thyromegaly or bruits. CHEST/PULM:  Clear to auscultation and percussion breath sounds equal no wheeze , rales or rhonchi. No  chest wall deformities or tenderness. CV: PMI is nondisplaced, S1 S2 no gallops, murmurs, rubs. Peripheral pulses are full without delay.No JVD .  Breast: normal by inspection . No dimpling, discharge, masses, tenderness or discharge . LN: no cervical axillary inguinal adenopathy  ABDOMEN: Bowel sounds normal nontender  No guard or rebound, no hepato splenomegal no CVA tenderness.  No hernia. Extremtities:  No clubbing cyanosis or edema, no acute joint swelling or redness no focal atrophy right le medial induration  About 6 cm  Area  Firm nonfluctuace.  NEURO:  Oriented x3, cranial nerves 3-12 appear to be intact, no obvious focal weakness,gait within normal limits no abnormal reflexes or asymmetrical SKIN: No acute rashes normal turgor, color, no bruising or petechiae. PSYCH: Oriented, good eye contact, no obvious depression anxiety, cognition and judgment appear normal. Pelvic: NL ext GU, labia clear without lesions or rash . Vagina no lesions .Cervix: clear  UTERUS: Neg CMT Adnexa:  clear no masses . PAP done rectal neg mass      Assessment & Plan:  Preventive Health Care Counseled regarding healthy nutrition, exercise, sleep, injury prevention, calcium vit d and healthy weight .  She will get a flu shot at the health Department she is up-to-date Hematoma right leg  Resolving  Sleep   Risk benefit of medication discussed.  blood pressure   Monitor for control Mood  SOB vs light headed at times ./ anxiety vs other  Sees cards  Dr Patty Sermons address this OSA : Cautioned and reviewed risk of not treating patient  aware Abnormal urine check for infection Osteoarthritis no change

## 2011-02-26 NOTE — Progress Notes (Signed)
Pt is aware and request Septra Ds be called in.  Rx sent in to pharmacy.

## 2011-02-26 NOTE — Assessment & Plan Note (Signed)
S. use of outside time to help mood. There other intensive therapies if needed

## 2011-02-26 NOTE — Patient Instructions (Signed)
Will notify you  of labs when available. Encourage you to treat the sleep apnea Disc he sx you are having with cariology  Check your readings also   Get day light  Every day for mood  celexa works better if needed on a daily basis.

## 2011-03-01 LAB — URINE CULTURE: Colony Count: >=100000

## 2011-03-03 ENCOUNTER — Telehealth: Payer: Self-pay | Admitting: *Deleted

## 2011-03-03 MED ORDER — SULFAMETHOXAZOLE-TRIMETHOPRIM 800-160 MG PO TABS: 1.0000 | ORAL_TABLET | Freq: Two times a day (BID) | ORAL | Status: AC

## 2011-03-03 NOTE — Telephone Encounter (Signed)
Pt aware of results 

## 2011-03-03 NOTE — Telephone Encounter (Signed)
Message copied by Romualdo Bolk on Wed Mar 03, 2011  3:15 PM ------      Message from: [facility] [place] [facility]      Created: Fri Feb 26, 2011  5:43 PM       Tell patient that her urine does look like infection  .      We are awaiting culture but   Can rx   If not allergic with SEPTRA DS 1 po bid for 5 days disp 10#

## 2011-03-04 NOTE — Progress Notes (Signed)
Pt aware of results 

## 2011-04-16 ENCOUNTER — Encounter: Payer: Self-pay | Admitting: Cardiology

## 2011-04-16 ENCOUNTER — Ambulatory Visit (INDEPENDENT_AMBULATORY_CARE_PROVIDER_SITE_OTHER): Payer: 59 | Admitting: Cardiology

## 2011-04-16 VITALS — BP 120/88 | HR 70 | Ht 64.0 in | Wt 212.0 lb

## 2011-04-16 DIAGNOSIS — I119 Hypertensive heart disease without heart failure: Secondary | ICD-10-CM

## 2011-04-16 DIAGNOSIS — E785 Hyperlipidemia, unspecified: Secondary | ICD-10-CM

## 2011-04-16 DIAGNOSIS — I10 Essential (primary) hypertension: Secondary | ICD-10-CM

## 2011-04-16 MED ORDER — HYDROCHLOROTHIAZIDE 25 MG PO TABS: 25.0000 mg | ORAL_TABLET | Freq: Every day | ORAL | Status: DC

## 2011-04-16 NOTE — Patient Instructions (Addendum)
Your physician recommends that you continue on your current medications as directed. Please refer to the Current Medication list given to you today. Watch your carbohydrate intake. Your physician encouraged you to lose weight for better health. Increase exercise Your physician wants you to follow-up in: 6 months You will receive a reminder letter in the mail two months in advance. If you don't receive a letter, please call our office to schedule the follow-up appointment.

## 2011-04-16 NOTE — Assessment & Plan Note (Signed)
The patient feels well.  She has not been expressing any symptoms from her blood pressure.  Been getting much exercise, do in part to the right leg injury occurring in July.  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  Since last visit.  Her weight is up 2 additional pounds.  She is going to work harder on limiting carbohydrates in her diet and try to increase exercise over the next 6 months.

## 2011-04-16 NOTE — Progress Notes (Signed)
Rachel Blair Date of Birth:  1950-11-06 [facility] [address]   [address] [place], [place]  [address] [phone]           Fax   [phone]  HPI: This pleasant 60 year old woman is seen for a six-month followup office visit.  She has generally been in good health.  In July her dog ran into her right leg, causing a significant soft tissue trauma.  There is no fracture.  She did go to one of the urgent cares.  The patient has not been experiencing any chest pain or shortness of breath.  He is a past history of essential hypertension, attention deficit syndrome, and dyslipidemia.  Dr. Fabian Sharp follows her lipids.  Current Outpatient Prescriptions  Medication Sig Dispense Refill  . citalopram (CELEXA) 20 MG tablet Take 20 mg by mouth daily. 1 daily as directed prn      . hydrochlorothiazide (HYDRODIURIL) 25 MG tablet Take 1 tablet (25 mg total) by mouth daily.  90 tablet  3  . LORazepam (ATIVAN) 0.5 MG tablet 1-2 pre flight  15 tablet  0  . zolpidem (AMBIEN) 10 MG tablet Take 1 tablet (10 mg total) by mouth at bedtime as needed for sleep.  90 tablet  1    Allergies  Allergen Reactions  . Adderall     REACTION: Coming out of skin  . Duloxetine     REACTION: Sleepy and nausea  . Hydrocodone-Acetaminophen     REACTION: abdominal pain  . Losartan     Chest pain  . Morphine Sulfate     REACTION: abdominal pain    Patient Active Problem List  Diagnoses  . HYPERLIPIDEMIA  . OBESITY  . Adjustment disorder with depressed mood  . OBSTRUCTIVE SLEEP APNEA  . HYPERTENSION  . ALLERGIC RHINITIS  . G E R D  . HAND PAIN, BILATERAL  . INSOMNIA  . CHEST DISCOMFORT  . ATTENTION OR CONCENTRATION DEFICIT  . ADVERSE REACTION TO MEDICATION  . FOREIGN BODY, FINGER  . Dysuria  . Routine general medical examination at a health care facility  . Abnormal urine odor  . Routine gynecological examination    History  Smoking status  . Former Smoker  Smokeless  tobacco  . Not on file    History  Alcohol Use  . Yes    Family History  Problem Relation Age of Onset  . Heart disease Mother   . Stroke Father   . Heart failure Father   . Emphysema Father   . Snoring Brother   . Lung cancer Brother   . Rheum arthritis Daughter     Review of Systems: The patient denies any heat or cold intolerance.  No weight gain or weight loss.  The patient denies headaches or blurry vision.  There is no cough or sputum production.  The patient denies dizziness.  There is no hematuria or hematochezia.  The patient denies any muscle aches or arthritis.  The patient denies any rash.  The patient denies frequent falling or instability.  There is no history of depression or anxiety.  All other systems were reviewed and are negative.   Physical Exam: Filed Vitals:   04/16/11 1117  BP: 120/88  Pulse: 70   Gen. appearance reveals a well-developed, well-nourished woman in no distress.Pupils equal and reactive.   Extraocular Movements are full.  There is no scleral icterus.  The mouth and pharynx are normal.  The neck is supple.  The  carotids reveal no bruits.  The jugular venous pressure is normal.  The thyroid is not enlarged.  There is no lymphadenopathy.  The chest is clear to percussion and auscultation. There are no rales or rhonchi. Expansion of the chest is symmetrical.  The precordium is quiet.  The first heart sound is normal.  The second heart sound is physiologically split.  There is no murmur gallop rub or click.  There is no abnormal lift or heave.  The abdomen is soft and nontender. Bowel sounds are normal. The liver and spleen are not enlarged. There Are no abdominal masses. There are no bruits.  The pedal pulses are good.  There is no phlebitis or edema.  There is no cyanosis or clubbing. Strength is normal and symmetrical in all extremities.  There is no lateralizing weakness.  There are no sensory deficits.     Assessment / Plan: Continue  same medication.  Recheck in 6 months for office visit and EKG.  Continue close cholesterol.  Followup with her primary care physician

## 2011-07-23 ENCOUNTER — Encounter (HOSPITAL_COMMUNITY): Payer: Self-pay | Admitting: *Deleted

## 2011-07-23 ENCOUNTER — Emergency Department (INDEPENDENT_AMBULATORY_CARE_PROVIDER_SITE_OTHER)
Admission: EM | Admit: 2011-07-23 | Discharge: 2011-07-23 | Disposition: A | Discharge status: Home / Self Care | Payer: 59 | Source: Home / Self Care | Attending: Family Medicine | Admitting: Family Medicine

## 2011-07-23 DIAGNOSIS — M542 Cervicalgia: Secondary | ICD-10-CM

## 2011-07-23 MED ORDER — BACLOFEN 10 MG PO TABS: 10.0000 mg | ORAL_TABLET | Freq: Three times a day (TID) | ORAL | Status: DC

## 2011-07-23 MED ORDER — PREDNISONE 20 MG PO TABS: ORAL_TABLET | ORAL | Status: AC

## 2011-07-23 MED ORDER — CELECOXIB 100 MG PO CAPS: 100.0000 mg | ORAL_CAPSULE | Freq: Two times a day (BID) | ORAL | Status: DC

## 2011-07-23 NOTE — ED Notes (Addendum)
Cervical neck pain - woke up with it 3 days ago - got better through the day - then worse. Has some numbness and tingling in left arm. Almost afraid to move now due to pain.  Limited ROM due to pain.Has taken flexeril, skelaxin lidocaine patch at various time over past 3 days without relief,

## 2011-07-23 NOTE — ED Notes (Signed)
Neck pain x 3 days - woke up with it and it got better for awhile - now hurts very bad.  Limited ROM with pain. Has slight numbness and tingling in left arm.  Has spinal stenosis.

## 2011-07-23 NOTE — ED Provider Notes (Signed)
History     CSN: [id number]  Arrival date & time 07/23/11  0865   First MD Initiated Contact with Patient 07/23/11 2111      Chief Complaint  Patient presents with  . Neck Pain    (Consider location/radiation/quality/duration/timing/severity/associated sxs/prior treatment) HPI Comments: 61 year old female with history of high blood pressure and neck and lumbar spinal stenosis as per her report. Here complaining of neck pain for 4 days noticed first after wakeup from bed, increase with turning her head toward the left side associated, with tingling and numbness sensation in both arms denies headache problems with her balance or gait. No visual changes from her baseline. No nausea or vomiting no hearing problems. No extremity weakness. Has taken Flexeril with some improvement Also her blood pressure is high today she has not taken her daily dose of hydrochlorothiazide today.  Denies falls or known injury.    Past Medical History  Diagnosis Date  . OSA (obstructive sleep apnea)     moderatley severe  . Chest pain     atypical  . Hypertension   . GERD (gastroesophageal reflux disease)   . Allergy   . Nasal septal deviation   . Odontogenic cyst     of left maxillary sinus  . History of pneumonia     Past Surgical History  Procedure Date  . Cholecystectomy   . Tonsillectomy   . Tubal ligation   . Left thumb trigger finger release   . Biopsy and bone graft of left maxillary sinus odontogenic cyst     Family History  Problem Relation Age of Onset  . Heart disease Mother   . Stroke Father   . Heart failure Father   . Emphysema Father   . Snoring Brother   . Lung cancer Brother   . Rheum arthritis Daughter     History  Substance Use Topics  . Smoking status: Former Smoker    Types: Cigarettes  . Smokeless tobacco: Never Used  . Alcohol Use: Yes    OB History    Grav Para Term Preterm Abortions TAB SAB Ect Mult Living   3 3 3       3       Review of Systems    Constitutional: Negative for fever and chills.  HENT: Positive for neck pain. Negative for sore throat.   Eyes: Negative for visual disturbance.  Gastrointestinal: Negative for nausea, vomiting and abdominal pain.  Musculoskeletal: Negative for gait problem.  Skin: Negative for rash.  Neurological: Negative for dizziness, tremors, seizures, facial asymmetry, speech difficulty, weakness and headaches.    Allergies  Amphetamine-dextroamphet er; Duloxetine; Hydrocodone-acetaminophen; Losartan; and Morphine sulfate  Home Medications   Current Outpatient Rx  Name Route Sig Dispense Refill  . HYDROCHLOROTHIAZIDE 25 MG PO TABS Oral Take 1 tablet (25 mg total) by mouth daily. 90 tablet 3  . ZOLPIDEM TARTRATE 10 MG PO TABS Oral Take 1 tablet (10 mg total) by mouth at bedtime as needed for sleep. 90 tablet 1  . BACLOFEN 10 MG PO TABS Oral Take 1 tablet (10 mg total) by mouth 3 (three) times daily. 30 each 0  . CELECOXIB 100 MG PO CAPS Oral Take 1 capsule (100 mg total) by mouth 2 (two) times daily. 30 capsule 0  . CITALOPRAM HYDROBROMIDE 20 MG PO TABS Oral Take 20 mg by mouth daily. 1 daily as directed prn    . LORAZEPAM 0.5 MG PO TABS  1-2 pre flight 15 tablet 0  .  PREDNISONE 20 MG PO TABS  2 tabs po daily for 5 days 10 tablet no    BP 160/101  Pulse 97  Temp(Src) 98.5 F (36.9 C) (Oral)  Resp 20  SpO2 99%  Physical Exam  Nursing note and vitals reviewed. Constitutional: She is oriented to person, place, and time. She appears well-developed and well-nourished. No distress.  HENT:  Head: Normocephalic and atraumatic.  Right Ear: External ear normal.  Left Ear: External ear normal.  Nose: Nose normal.  Mouth/Throat: Oropharynx is clear and moist. No oropharyngeal exudate.  Eyes: Conjunctivae and EOM are normal. Pupils are equal, round, and reactive to light.  Neck:       Increased tone and tender to palpation in left paravertebral muscles in left side. Increased with head rotation  to left against resistance. Still fair range of motion. With flexion and extension. No pain with palpation over bone prominences. Negative Spurling test.   Neurological: She is alert and oriented to person, place, and time. No cranial nerve deficit.       Entire bilateral upper extremities with normal extrenght and superficial sensation. symmetric DTR's.    ED Course  Procedures (including critical care time)  Labs Reviewed - No data to display No results found.   1. Neck pain on left side       MDM  61 year old with history of cervical spinal stenosis here complaining of left side neck pain with normal neuro examination, impression muscle spasms; prescribed baclofen, prednisone and Celebrex. Encourage to be compliant with her blood high blood pressure medications and have it rechecked once neck pain improves patient instructed to go to the emergency department if extremity weakness or worsening symptoms.   Sharin Grave, MD 07/25/11 1825

## 2011-07-28 ENCOUNTER — Encounter (HOSPITAL_COMMUNITY): Payer: Self-pay | Admitting: *Deleted

## 2011-08-16 ENCOUNTER — Ambulatory Visit (INDEPENDENT_AMBULATORY_CARE_PROVIDER_SITE_OTHER): Payer: 59 | Admitting: Internal Medicine

## 2011-08-16 VITALS — BP 120/70 | HR 117 | Temp 99.0°F | Wt 216.0 lb

## 2011-08-16 DIAGNOSIS — J309 Allergic rhinitis, unspecified: Secondary | ICD-10-CM

## 2011-08-16 DIAGNOSIS — R05 Cough: Secondary | ICD-10-CM

## 2011-08-16 DIAGNOSIS — G4733 Obstructive sleep apnea (adult) (pediatric): Secondary | ICD-10-CM

## 2011-08-16 DIAGNOSIS — E785 Hyperlipidemia, unspecified: Secondary | ICD-10-CM

## 2011-08-16 DIAGNOSIS — R059 Cough, unspecified: Secondary | ICD-10-CM

## 2011-08-16 DIAGNOSIS — I119 Hypertensive heart disease without heart failure: Secondary | ICD-10-CM

## 2011-08-16 DIAGNOSIS — R82998 Other abnormal findings in urine: Secondary | ICD-10-CM

## 2011-08-16 DIAGNOSIS — Z01419 Encounter for gynecological examination (general) (routine) without abnormal findings: Secondary | ICD-10-CM

## 2011-08-16 DIAGNOSIS — F4321 Adjustment disorder with depressed mood: Secondary | ICD-10-CM

## 2011-08-16 DIAGNOSIS — E669 Obesity, unspecified: Secondary | ICD-10-CM

## 2011-08-16 DIAGNOSIS — G4700 Insomnia, unspecified: Secondary | ICD-10-CM

## 2011-08-16 DIAGNOSIS — R8290 Unspecified abnormal findings in urine: Secondary | ICD-10-CM

## 2011-08-16 DIAGNOSIS — R350 Frequency of micturition: Secondary | ICD-10-CM

## 2011-08-16 DIAGNOSIS — Z0000 Encounter for general adult medical examination without abnormal findings: Secondary | ICD-10-CM

## 2011-08-16 DIAGNOSIS — I10 Essential (primary) hypertension: Secondary | ICD-10-CM

## 2011-08-16 MED ORDER — HYDROCHLOROTHIAZIDE 25 MG PO TABS: 25.0000 mg | ORAL_TABLET | Freq: Every day | ORAL | Status: DC

## 2011-08-16 MED ORDER — ZOLPIDEM TARTRATE 10 MG PO TABS: 10.0000 mg | ORAL_TABLET | Freq: Every evening | ORAL | Status: DC | PRN

## 2011-08-16 MED ORDER — HYDROCODONE-HOMATROPINE 5-1.5 MG/5ML PO SYRP: 5.0000 mL | ORAL_SOLUTION | ORAL | Status: AC | PRN

## 2011-08-16 NOTE — Progress Notes (Signed)
Subjective:    Patient ID: Rachel Blair, female    DOB: 1951-04-14, 61 y.o.   MRN: [id number]  HPI Patient comes in today for SDA for  new problem evaluation.  Bad cough  Dry  No fever   For up  2 weeks   Coughing very badly with spasm.  NO sob no face pain.  NO wheezing  Used mucinex dm.  Decrease heating and congestion in right ear.  Ok to take codiene cough meds .   Gi  Se.   Flexeril for shoulder   recently And did injection  Dr Penni Bombard    Left  Shoulder.   Review of Systems Neg cp sob bleeding new HA  Gi gu except gerd stable    Past history family history social history reviewed in the electronic medical record.     Objective:   Physical Exam WDWN in NAD  quiet respirations; mildly congested  somewhat hoarse. Non toxic . HEENT: Normocephalic ;atraumatic , Eyes;  PERRL, EOMs  Full, lids and conjunctiva clear,,Ears: no deformities, canals nl, TM landmarks normal, Nose: no deformity or discharge but minimally congested; non  tender Mouth : OP clear without lesion or edema . Neck: Supple without adenopathy or masses or bruits Chest:  Clear to A&P without wheezes rales or rhonchi CV:  S1-S2 no gallops or murmurs peripheral perfusion is normal Skin :nl perfusion and no acute rashes       Assessment & Plan:  Cough disc diff dx and no alarm sx today  But Counseled. In the regard .    Expectant management. Call or seek care  if  persistent or progressive or alarm symptoms as discussed.  HT   Doing ok   And ocntrolled   wo se   Will refill meds had labs elsewhere she says  To get [place] copy.   Sleep asks to continue ambien   Risk benefit of medication discussed.  Dependence etc   OSA   Mild  Shoulder pain   Under care

## 2011-08-16 NOTE — Patient Instructions (Signed)
This cough could be from postnasal drainage and a viral respiratory infection at this time there is no evidence of pneumonia or bacterial sinus infection at this time.  Can use the cough medicine with care because you're taking Ambien. He can also try an antihistamine for drainage such as Zyrtec  or chlorpheniramine or Benadryl. Could be careful with sedation.  If the cough is persistent and ongoing or you get fever wheezing significant shortness of breath you need to return we will consider a chest x-ray.  We refilled her hydrochlorothiazide today. Make sure you get lab tests sent to [place]..  Preventative visit checkup in 6 months : fall

## 2011-08-21 ENCOUNTER — Encounter: Payer: Self-pay | Admitting: Internal Medicine

## 2011-08-24 ENCOUNTER — Telehealth: Payer: Self-pay | Admitting: Cardiology

## 2011-08-24 NOTE — Telephone Encounter (Signed)
No labs needed at this time.  Her primary care follows her lipids.

## 2011-08-24 NOTE — Telephone Encounter (Signed)
Pt set up recall appt, wants to know if needs labs? ok to leave message

## 2011-08-24 NOTE — Telephone Encounter (Signed)
Do you want any labs on her

## 2011-08-24 NOTE — Telephone Encounter (Signed)
Advised patient

## 2011-08-26 ENCOUNTER — Ambulatory Visit: Payer: 59 | Admitting: Internal Medicine

## 2011-10-20 ENCOUNTER — Ambulatory Visit (INDEPENDENT_AMBULATORY_CARE_PROVIDER_SITE_OTHER): Payer: 59 | Admitting: Cardiology

## 2011-10-20 ENCOUNTER — Encounter: Payer: Self-pay | Admitting: Cardiology

## 2011-10-20 DIAGNOSIS — IMO0001 Reserved for inherently not codable concepts without codable children: Secondary | ICD-10-CM

## 2011-10-20 DIAGNOSIS — Z8249 Family history of ischemic heart disease and other diseases of the circulatory system: Secondary | ICD-10-CM

## 2011-10-20 DIAGNOSIS — I10 Essential (primary) hypertension: Secondary | ICD-10-CM

## 2011-10-20 DIAGNOSIS — I119 Hypertensive heart disease without heart failure: Secondary | ICD-10-CM

## 2011-10-20 DIAGNOSIS — R079 Chest pain, unspecified: Secondary | ICD-10-CM

## 2011-10-20 DIAGNOSIS — E785 Hyperlipidemia, unspecified: Secondary | ICD-10-CM

## 2011-10-20 NOTE — Assessment & Plan Note (Signed)
The patient has a past history of dyslipidemia.  This is followed by Dr. Fabian Sharp.  The patient is not on cholesterol-lowering medication.  She is attempting to control her cholesterol with diet alone.

## 2011-10-20 NOTE — Patient Instructions (Signed)
Your physician has requested that you have an abdominal aorta duplex. During this test, an ultrasound is used to evaluate the aorta. Allow 30 minutes for this exam. Do not eat after midnight the day before and avoid carbonated beverages  Your physician recommends that you continue on your current medications as directed. Please refer to the Current Medication list given to you today.  Your physician wants you to follow-up in: 6 months You will receive a reminder letter in the mail two months in advance. If you don't receive a letter, please call our office to schedule the follow-up appointment.

## 2011-10-20 NOTE — Progress Notes (Signed)
Rachel Blair Date of Birth:  1950/06/22 [facility] [address] [place], [place]  [address] [phone]  Fax   [phone]  HPI: This pleasant 61 year old woman is seen for a six-month followup office visit.  She is a past history of essential hypertension and dyslipidemia.  Since last visit she has been doing reasonably well.  He has some atypical chest pain which does not sound cardiac and last less than 1 minute it is not related to exertion.  She does have some exertional dyspnea when working in her yard.  She's not been expressing any dizziness or syncope.  Current Outpatient Prescriptions  Medication Sig Dispense Refill  . citalopram (CELEXA) 20 MG tablet Take 20 mg by mouth daily. 1 daily as directed prn      . hydrochlorothiazide (HYDRODIURIL) 25 MG tablet Take 1 tablet (25 mg total) by mouth daily.  90 tablet  1  . zolpidem (AMBIEN) 10 MG tablet Take 1 tablet (10 mg total) by mouth at bedtime as needed for sleep.  90 tablet  1    Allergies  Allergen Reactions  . Amphetamine-Dextroamphetamine     REACTION: Coming out of skin  . Duloxetine     REACTION: Sleepy and nausea  . Hydrocodone-Acetaminophen     REACTION: abdominal pain  . Losartan     Chest pain  . Morphine Sulfate     REACTION: abdominal pain    Patient Active Problem List  Diagnoses  . HYPERLIPIDEMIA  . OBESITY  . Adjustment disorder with depressed mood  . OBSTRUCTIVE SLEEP APNEA  . HYPERTENSION  . ALLERGIC RHINITIS  . G E R D  . HAND PAIN, BILATERAL  . INSOMNIA  . CHEST DISCOMFORT  . ATTENTION OR CONCENTRATION DEFICIT  . ADVERSE REACTION TO MEDICATION  . FOREIGN BODY, FINGER  . Dysuria  . Routine general medical examination at a health care facility  . Abnormal urine odor  . Routine gynecological examination  . Cough    History  Smoking status  . Former Smoker  . Types: Cigarettes  Smokeless tobacco  . Never Used    History  Alcohol Use  . Yes    Family  History  Problem Relation Age of Onset  . Heart disease Mother   . Stroke Father   . Heart failure Father   . Emphysema Father   . Snoring Brother   . Lung cancer Brother   . Rheum arthritis Daughter     Review of Systems: The patient denies any heat or cold intolerance.  No weight gain or weight loss.  The patient denies headaches or blurry vision.  There is no cough or sputum production.  The patient denies dizziness.  There is no hematuria or hematochezia.  The patient denies any muscle aches or arthritis.  The patient denies any rash.  The patient denies frequent falling or instability.  There is no history of depression or anxiety.  All other systems were reviewed and are negative.   Physical Exam: Filed Vitals:   10/20/11 1023  BP: 130/76  Pulse: 84   transvalvular well-developed well-nourished woman in no distress.  Her weight is down 2 pounds since last visit.The head and neck exam reveals pupils equal and reactive.  Extraocular movements are full.  There is no scleral icterus.  The mouth and pharynx are normal.  The neck is supple.  The carotids reveal no bruits.  The jugular venous pressure is normal.  The  thyroid is not enlarged.  There is no lymphadenopathy.  The chest is clear to percussion and auscultation.  There are no rales or rhonchi.  Expansion of the chest is symmetrical.  The precordium is quiet.  The first heart sound is normal.  The second heart sound is physiologically split.  There is no murmur gallop rub or click.  There is no abnormal lift or heave.  The abdomen is soft and nontender.  The bowel sounds are normal.  The liver and spleen are not enlarged.  There are no abdominal masses.  There are no abdominal bruits.  Extremities reveal good pedal pulses.  There is no phlebitis or edema.  There is no cyanosis or clubbing.  Strength is normal and symmetrical in all extremities.  There is no lateralizing weakness.  There are no sensory deficits.  The skin is warm and dry.   There is no rash.  EKG shows normal sinus rhythm with left axis deviation and no acute changes.  There is no significant change since 09/28/07.    Assessment / Plan: Continue same medication. The patient is inquiring about abdominal aortic aneurysm.  Her mother had an abdominal aortic aneurysm as did several of her uncles and aunts.  The patient herself has never been checked for an abdominal aortic aneurysm and we will arrange for an ultrasound of the abdominal aorta.  I don't feel any mass on physical exam today but she is mildly obese. Recheck in 6 months for followup office visit

## 2011-10-20 NOTE — Assessment & Plan Note (Signed)
Patient has a past history of essential hypertension.  She is on HydroDIURIL with good control.  Is not having any side effects from the HydroDIURIL.

## 2011-11-01 ENCOUNTER — Encounter (INDEPENDENT_AMBULATORY_CARE_PROVIDER_SITE_OTHER): Payer: 59

## 2011-11-01 DIAGNOSIS — I119 Hypertensive heart disease without heart failure: Secondary | ICD-10-CM

## 2011-11-01 DIAGNOSIS — I700 Atherosclerosis of aorta: Secondary | ICD-10-CM

## 2011-11-01 DIAGNOSIS — IMO0001 Reserved for inherently not codable concepts without codable children: Secondary | ICD-10-CM

## 2011-11-09 ENCOUNTER — Telehealth: Payer: Self-pay | Admitting: *Deleted

## 2011-11-09 NOTE — Telephone Encounter (Signed)
Left message with husband.

## 2011-11-09 NOTE — Telephone Encounter (Signed)
Message copied by Burnell Blanks on Tue Nov 09, 2011 10:42 AM ------      Message from: Cassell Clement      Created: Mon Nov 08, 2011  6:24 PM       The duplex did not show any aneurysm or other abnormalities.

## 2011-12-17 ENCOUNTER — Telehealth: Payer: Self-pay | Admitting: Internal Medicine

## 2011-12-17 MED ORDER — LORAZEPAM 0.5 MG PO TABS: ORAL_TABLET | ORAL | Status: DC

## 2011-12-17 NOTE — Telephone Encounter (Signed)
Caller: Amandajo/Patient is calling with a question about Ativan.The medication was written by Madelin Headings.  Asking for new RX for Ativan for upcoming flight 12/27/11 d/t fear of flying.  Requests Ativan be called to [facility].  Info noted and sent to MD for adult with question about RX medication not covered by available resources per Medication Question Call Guideline.

## 2011-12-17 NOTE — Telephone Encounter (Signed)
Rx called in to [facility]

## 2011-12-17 NOTE — Telephone Encounter (Signed)
Ok per Dr Fabian Sharp #15  1-2 before flight no refills

## 2011-12-17 NOTE — Telephone Encounter (Signed)
Patient only needs a refill of ativan for her anxiety with flying.  Is this okay to fill?

## 2012-02-15 ENCOUNTER — Other Ambulatory Visit (INDEPENDENT_AMBULATORY_CARE_PROVIDER_SITE_OTHER): Payer: 59

## 2012-02-15 DIAGNOSIS — Z0000 Encounter for general adult medical examination without abnormal findings: Secondary | ICD-10-CM

## 2012-02-15 LAB — BASIC METABOLIC PANEL
CO2: 28 mEq/L (ref 19–32)
Calcium: 9.1 mg/dL (ref 8.4–10.5)
Creatinine, Ser: 0.9 mg/dL (ref 0.4–1.2)
GFR: 68.59 mL/min (ref >60.00)

## 2012-02-15 LAB — LIPID PANEL
Cholesterol: 198 mg/dL (ref 0–200)
LDL Cholesterol: 120 mg/dL — ABNORMAL HIGH (ref 0–99)
Triglycerides: 135 mg/dL (ref 0.0–149.0)
VLDL: 27 mg/dL (ref 0.0–40.0)

## 2012-02-15 LAB — HEPATIC FUNCTION PANEL
Alkaline Phosphatase: 64 U/L (ref 39–117)
Bilirubin, Direct: 0.1 mg/dL (ref 0.0–0.3)
Total Protein: 6.7 g/dL (ref 6.0–8.3)

## 2012-02-15 LAB — POCT URINALYSIS DIPSTICK
Glucose, UA: NEGATIVE
Ketones, UA: NEGATIVE
Spec Grav, UA: 1.02

## 2012-02-15 LAB — CBC WITH DIFFERENTIAL/PLATELET
Basophils Absolute: 0 10*3/uL (ref 0.0–0.1)
Basophils Relative: 0.6 % (ref 0.0–3.0)
Eosinophils Absolute: 0.2 10*3/uL (ref 0.0–0.7)
Lymphocytes Relative: 37 % (ref 12.0–46.0)
MCHC: 33.5 g/dL (ref 30.0–36.0)
Neutrophils Relative %: 49.6 % (ref 43.0–77.0)
RBC: 5.1 Mil/uL (ref 3.87–5.11)

## 2012-02-21 ENCOUNTER — Encounter: Payer: Self-pay | Admitting: Internal Medicine

## 2012-02-21 ENCOUNTER — Ambulatory Visit (INDEPENDENT_AMBULATORY_CARE_PROVIDER_SITE_OTHER): Payer: 59 | Admitting: Internal Medicine

## 2012-02-21 VITALS — BP 150/94 | HR 102 | Temp 98.5°F | Ht 63.75 in | Wt 221.0 lb

## 2012-02-21 DIAGNOSIS — Z23 Encounter for immunization: Secondary | ICD-10-CM

## 2012-02-21 DIAGNOSIS — G4700 Insomnia, unspecified: Secondary | ICD-10-CM

## 2012-02-21 DIAGNOSIS — E669 Obesity, unspecified: Secondary | ICD-10-CM

## 2012-02-21 DIAGNOSIS — E785 Hyperlipidemia, unspecified: Secondary | ICD-10-CM

## 2012-02-21 DIAGNOSIS — F4321 Adjustment disorder with depressed mood: Secondary | ICD-10-CM

## 2012-02-21 DIAGNOSIS — I10 Essential (primary) hypertension: Secondary | ICD-10-CM

## 2012-02-21 DIAGNOSIS — Z01419 Encounter for gynecological examination (general) (routine) without abnormal findings: Secondary | ICD-10-CM

## 2012-02-21 DIAGNOSIS — Z0000 Encounter for general adult medical examination without abnormal findings: Secondary | ICD-10-CM

## 2012-02-21 DIAGNOSIS — R8290 Unspecified abnormal findings in urine: Secondary | ICD-10-CM

## 2012-02-21 DIAGNOSIS — G4733 Obstructive sleep apnea (adult) (pediatric): Secondary | ICD-10-CM

## 2012-02-21 DIAGNOSIS — R350 Frequency of micturition: Secondary | ICD-10-CM

## 2012-02-21 DIAGNOSIS — J309 Allergic rhinitis, unspecified: Secondary | ICD-10-CM

## 2012-02-21 MED ORDER — ZOLPIDEM TARTRATE 10 MG PO TABS: 10.0000 mg | ORAL_TABLET | Freq: Every evening | ORAL | Status: DC | PRN

## 2012-02-21 NOTE — Patient Instructions (Signed)
Take BP med on a regular basis   Goal if below 140/90  weight loss will help all the health issue.  Caution with ambien  weight loss may help sleep and BP

## 2012-02-21 NOTE — Progress Notes (Signed)
Subjective:    Patient ID: Rachel Blair, female    DOB: 1951-05-06, 61 y.o.   MRN: [id number]  HPI Patient comes in today for Preventive Health Care visit  No major change in health status since last visit . Has "retired " again.  Recurrent UTI   Get better but has bad odor at times.   Bp not taking meds sometimes    Doing ok .   Sleep still taking meds and hard to sleep without  Wants to astya on meds no se reported. Mood on celexa stable  Taking mostly prn Review of Systems ROS:  GEN/ HEENT: No fever, significant weight changes sweats headaches vision problems hearing changes, CV/ PULM; No chest pain shortness of breath cough, syncope,edema  change in exercise tolerance. GI /GU: No adominal pain, vomiting, change in bowel habits. No blood in the stool. No significant GU symptoms. SKIN/HEME: ,no acute skin rashes suspicious lesions or bleeding. No lymphadenopathy, nodules, masses.  NEURO/ PSYCH:  No neurologic signs such as weakness numbness. No depression anxiety. IMM/ Allergy: No unusual infections.  Allergy .   REST of 12 system review negative except as per HPI Outpatient Encounter Prescriptions as of 02/21/2012  Medication Sig Dispense Refill  . citalopram (CELEXA) 20 MG tablet Take 20 mg by mouth daily. 1 daily as directed prn      . hydrochlorothiazide (HYDRODIURIL) 25 MG tablet Take 1 tablet (25 mg total) by mouth daily.  90 tablet  1  . LORazepam (ATIVAN) 0.5 MG tablet 1-2 pre flight  15 tablet  0  . zolpidem (AMBIEN) 10 MG tablet Take 1 tablet (10 mg total) by mouth at bedtime as needed for sleep.  90 tablet  1       Objective:   Physical Exam BP 150/94  Pulse 102  Temp 98.5 F (36.9 C) (Oral)  Ht 5' 3.75" (1.619 m)  Wt 221 lb (100.245 kg)  BMI 38.23 kg/m2  SpO2 98% Wt Readings from Last 3 Encounters:  02/21/12 221 lb (100.245 kg)  10/20/11 210 lb (95.255 kg)  08/16/11 216 lb (97.977 kg)  Physical Exam: Vital signs reviewed UXL:KGMW is a well-developed  well-nourished alert cooperative  white female who appears her stated age in no acute distress.  HEENT: normocephalic atraumatic , Eyes: PERRL EOM's full, conjunctiva clear, Nares: paten,t no deformity discharge or tenderness., Ears: no deformity EAC's clear TMs with normal landmarks. Mouth: clear OP, no lesions, edema.  Moist mucous membranes. Dentition in adequate repair. NECK: supple without masses, thyromegaly or bruits. CHEST/PULM:  Clear to auscultation and percussion breath sounds equal no wheeze , rales or rhonchi. No chest wall deformities or tenderness. CV: PMI is nondisplaced, S1 S2 no gallops, murmurs, rubs. Peripheral pulses are full without delay.No JVD .  Breast: normal by inspection . No dimpling, discharge, masses, tenderness or discharge . ABDOMEN: Bowel sounds normal nontender  No guard or rebound, no hepato splenomegal no CVA tenderness.  No hernia. Extremtities:  No clubbing cyanosis or edema, no acute joint swelling or redness no focal atrophy NEURO:  Oriented x3, cranial nerves 3-12 appear to be intact, no obvious focal weakness,gait within normal limits no abnormal reflexes or asymmetrical SKIN: No acute rashes normal turgor, color, no bruising or petechiae. PSYCH: Oriented, good eye contact, no obvious depression anxiety, cognition and judgment appear normal. LN: no cervical axillary inguinal adenopathy Pelvic: NL ext GU, labia clear without lesions or rash . Vagina no lesions .Cervix: clear  UTERUS: Neg CMT Adnexa:  clear no masses . PAP done Rectal neg masses   Lab Results  Component Value Date   WBC 6.0 02/15/2012   HGB 15.1* 02/15/2012   HCT 45.1 02/15/2012   PLT 196.0 02/15/2012   GLUCOSE 88 02/15/2012   CHOL 198 02/15/2012   TRIG 135.0 02/15/2012   HDL 51.20 02/15/2012   LDLDIRECT 121.8 02/26/2011   LDLCALC 120* 02/15/2012   ALT 18 02/15/2012   AST 19 02/15/2012   NA 140 02/15/2012   K 4.2 02/15/2012   CL 103 02/15/2012   CREATININE 0.9 02/15/2012   BUN 14 02/15/2012   CO2 28 02/15/2012    TSH 1.19 02/15/2012       Assessment & Plan:   Preventive Health Care Counseled regarding healthy nutrition, exercise, sleep, injury prevention, calcium vit d and healthy weight . GYNE exam  PAP HT: no taking bp med on ar egular basis  OSA  Not treating  Sleep ambien every night .    Risk benefit of medication discussed. As well as dosing Pt wants to stay on med  Mood   Taking as needed in fall.  For seasonal depressive sx  Urine issues odor  Disc options no infection noted today

## 2012-02-27 ENCOUNTER — Encounter: Payer: Self-pay | Admitting: Internal Medicine

## 2012-06-19 ENCOUNTER — Other Ambulatory Visit: Payer: Self-pay | Admitting: Internal Medicine

## 2012-06-19 DIAGNOSIS — Z1231 Encounter for screening mammogram for malignant neoplasm of breast: Secondary | ICD-10-CM

## 2012-06-20 ENCOUNTER — Ambulatory Visit (HOSPITAL_COMMUNITY): Payer: 59

## 2012-06-20 ENCOUNTER — Ambulatory Visit (HOSPITAL_COMMUNITY)
Admission: RE | Admit: 2012-06-20 | Discharge: 2012-06-20 | Disposition: A | Discharge status: Home / Self Care | Payer: 59 | Source: Ambulatory Visit | Attending: Internal Medicine | Admitting: Internal Medicine

## 2012-06-20 DIAGNOSIS — Z1231 Encounter for screening mammogram for malignant neoplasm of breast: Secondary | ICD-10-CM | POA: Insufficient documentation

## 2012-06-23 IMAGING — CR DG ANKLE COMPLETE 3+V*R*
3 series · 3 of 3 positions shown · non-contrast
Comparison: 01/19/2011

CLINICAL DATA: The patients dog ran into her right leg 1 week ago.
Pain, bruising, and swelling.

RIGHT ANKLE - COMPLETE 3+ VIEW

[view not recorded (1 of 3)]
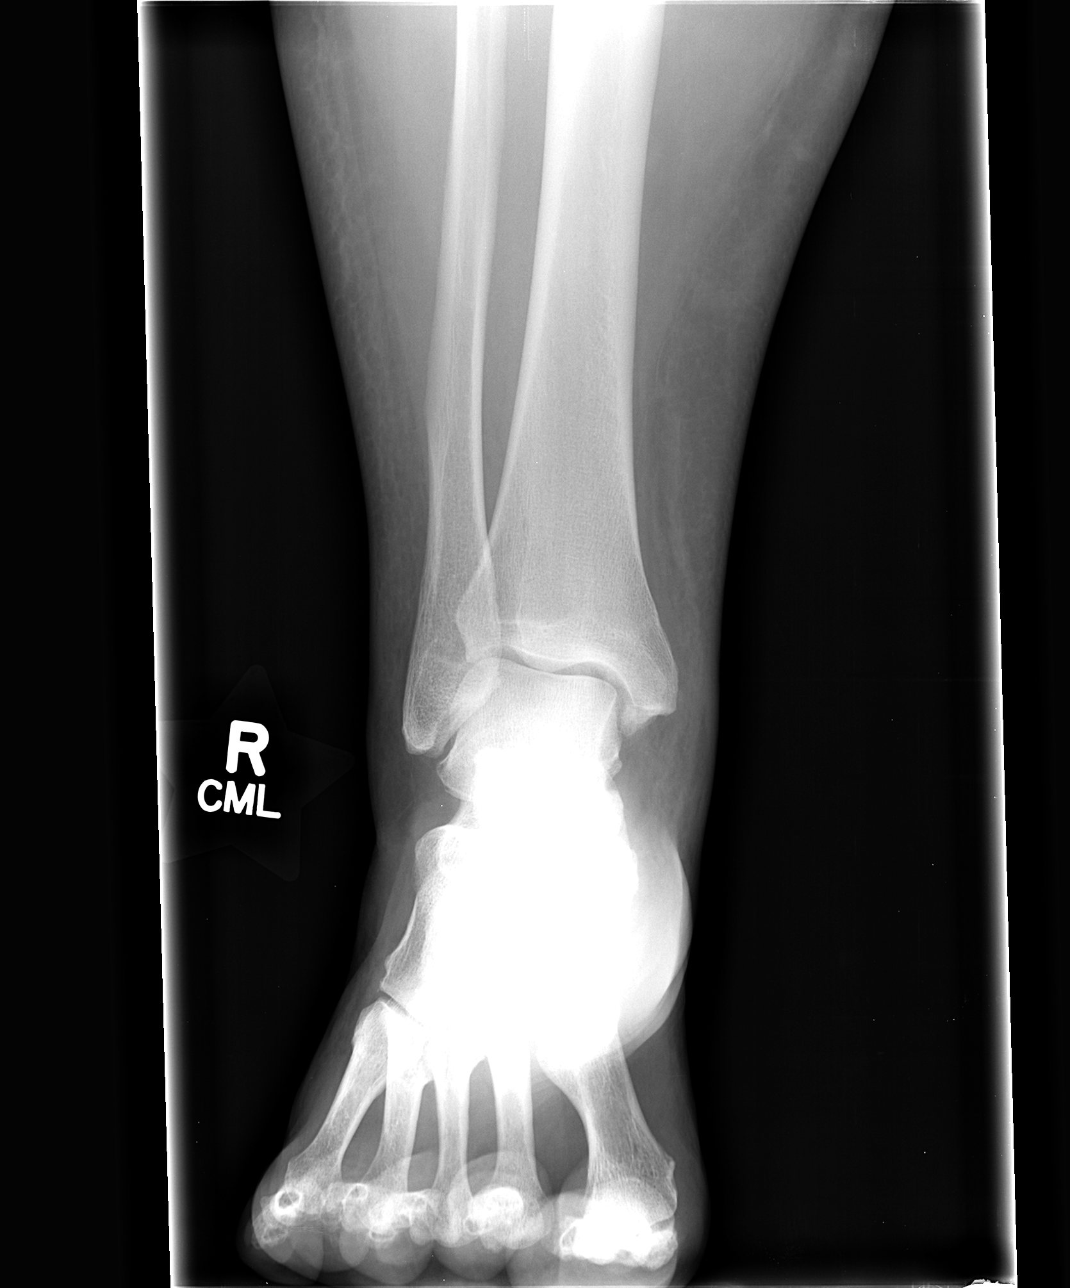

[view not recorded (2 of 3)]
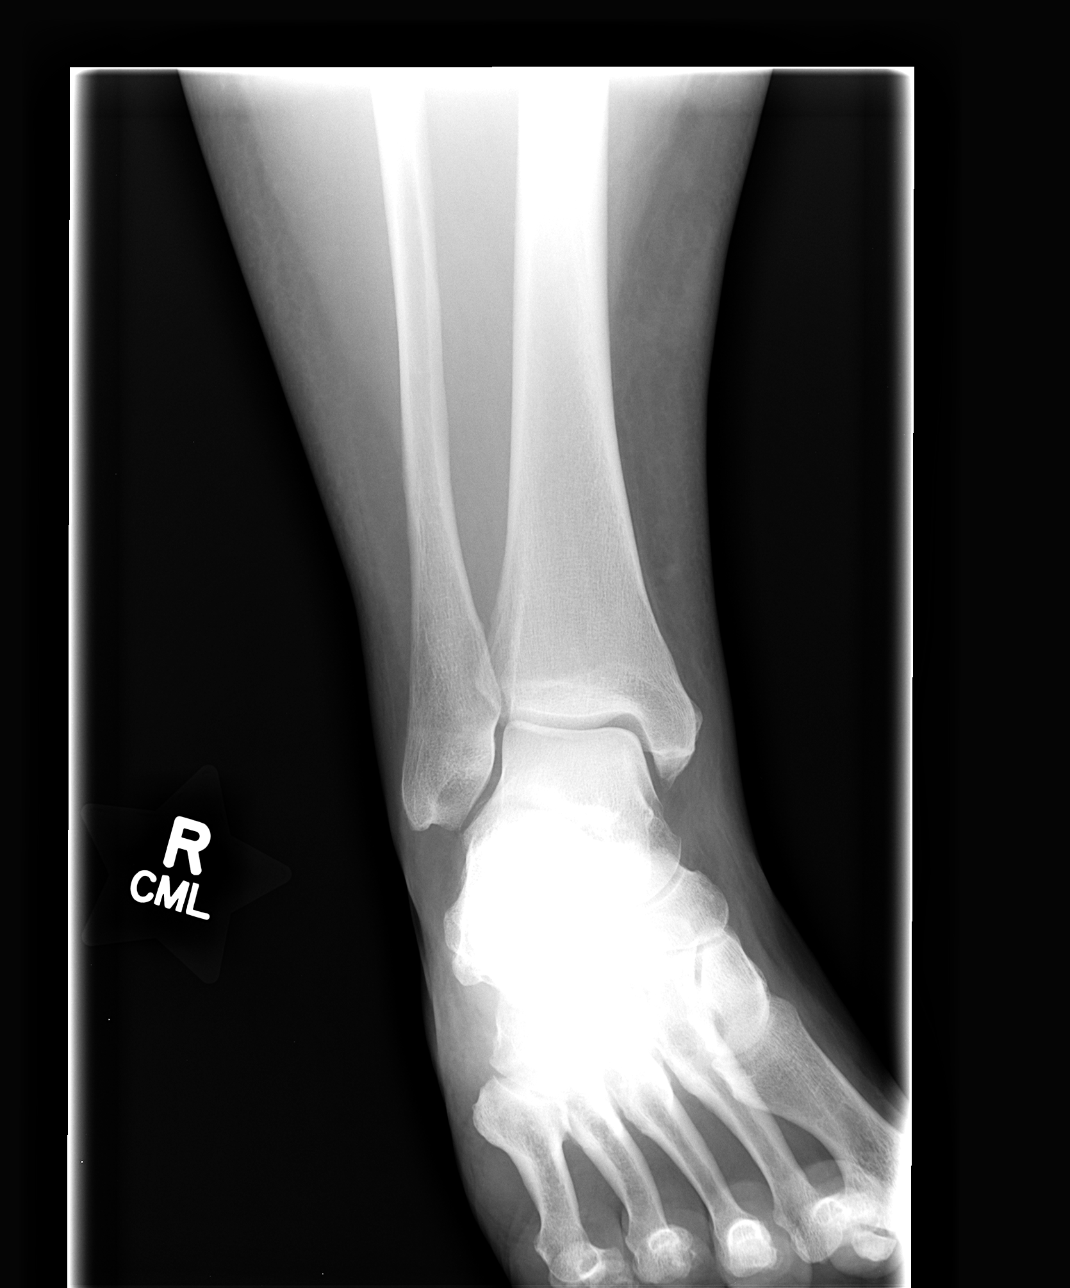

[view not recorded (3 of 3)]
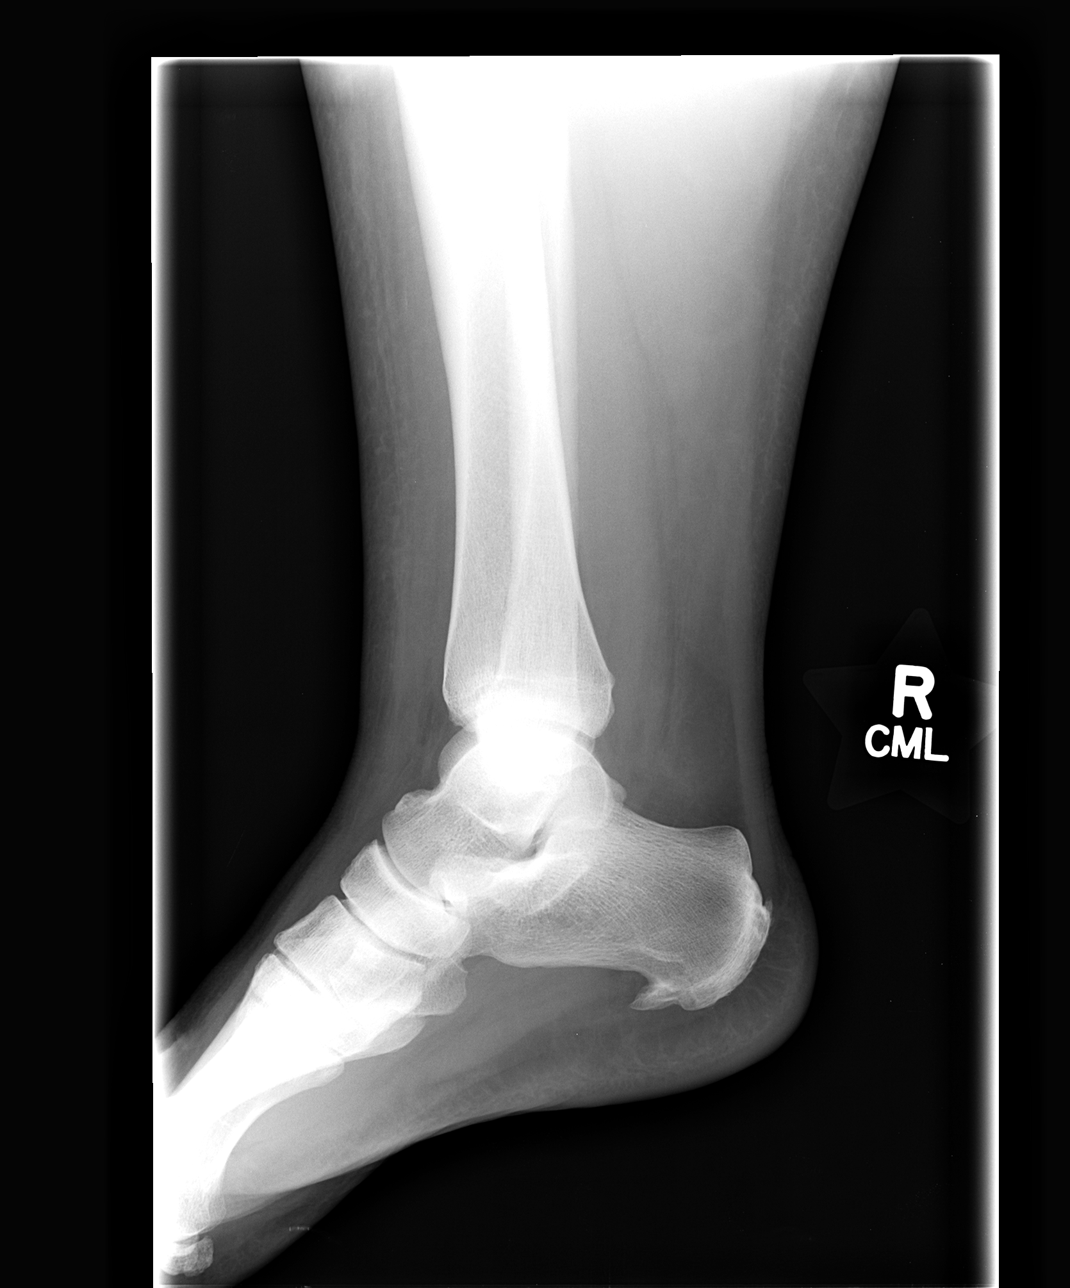

[3 of 3 positions shown; findings below may reference images not displayed]

FINDINGS: There is a suggestion of subcutaneous edema along the
distal calf.

The plafond and talar dome appear intact.  No malleolar fracture
noted.

Plantar and Achilles calcaneal spurs noted.
IMPRESSION: 1.  Plantar and Achilles calcaneal spurs.
2.  Subcutaneous edema in the calf.
3.  No acute underlying osseous abnormality is observed.

## 2012-07-10 ENCOUNTER — Encounter: Payer: Self-pay | Admitting: Internal Medicine

## 2012-07-10 ENCOUNTER — Ambulatory Visit (INDEPENDENT_AMBULATORY_CARE_PROVIDER_SITE_OTHER): Payer: 59 | Admitting: Internal Medicine

## 2012-07-10 VITALS — BP 130/80 | HR 114 | Temp 98.9°F | Wt 217.0 lb

## 2012-07-10 DIAGNOSIS — R109 Unspecified abdominal pain: Secondary | ICD-10-CM

## 2012-07-10 DIAGNOSIS — M549 Dorsalgia, unspecified: Secondary | ICD-10-CM

## 2012-07-10 DIAGNOSIS — I119 Hypertensive heart disease without heart failure: Secondary | ICD-10-CM

## 2012-07-10 DIAGNOSIS — M129 Arthropathy, unspecified: Secondary | ICD-10-CM

## 2012-07-10 DIAGNOSIS — I10 Essential (primary) hypertension: Secondary | ICD-10-CM

## 2012-07-10 DIAGNOSIS — N390 Urinary tract infection, site not specified: Secondary | ICD-10-CM

## 2012-07-10 DIAGNOSIS — M1990 Unspecified osteoarthritis, unspecified site: Secondary | ICD-10-CM

## 2012-07-10 LAB — POCT URINALYSIS DIPSTICK
Protein, UA: 1
Spec Grav, UA: 1.025
Urobilinogen, UA: 2

## 2012-07-10 MED ORDER — CEFUROXIME AXETIL 500 MG PO TABS: 500.0000 mg | ORAL_TABLET | Freq: Two times a day (BID) | ORAL | Status: DC

## 2012-07-10 MED ORDER — HYDROCHLOROTHIAZIDE 25 MG PO TABS: 25.0000 mg | ORAL_TABLET | Freq: Every day | ORAL | Status: DC

## 2012-07-10 MED ORDER — CELECOXIB 200 MG PO CAPS
200.0000 mg | ORAL_CAPSULE | Freq: Every day | ORAL | Status: DC
Start: 2012-07-10 — End: 2012-12-27

## 2012-07-10 NOTE — Progress Notes (Signed)
Chief Complaint  Patient presents with  . Abdominal Pain    Started 2 weeks ago.  . Back Pain  . Fever    HPI: Patient comes in today for SDA for  new problem evaluation. Has had a bad odor to the urine for a while but then over the last 1-2 weeks she developed left lower Corder tenderness possibly left back pain with achiness possible fever no nausea vomiting diarrhea. No hematuria or dysuria otherwise. ROS: See pertinent positives and negatives per HPI. Taking Tylenol and at times naproxen for her arthritis joint pain asks if we can send in some Celebrex as it did help her in her make past. Blood pressure is good needs a refill on the diuretic.  Past Medical History  Diagnosis Date  . OSA (obstructive sleep apnea)     moderatley severe  . Chest pain     atypical  . Hypertension   . GERD (gastroesophageal reflux disease)   . Allergy   . Nasal septal deviation   . Odontogenic cyst     of left maxillary sinus  . History of pneumonia   . CHEST DISCOMFORT 08/30/2007    Qualifier: Diagnosis of  By: Fabian Sharp MD, Neta Mends   . OBSTRUCTIVE SLEEP APNEA 08/30/2007    Qualifier: Diagnosis of  By: Fabian Sharp MD, Neta Mends     Family History  Problem Relation Age of Onset  . Heart disease Mother   . Stroke Father   . Heart failure Father   . Emphysema Father   . Snoring Brother   . Lung cancer Brother   . Rheum arthritis Daughter     History   Social History  . Marital Status: Married    Spouse Name: N/A    Number of Children: N/A  . Years of Education: N/A   Social History Main Topics  . Smoking status: Former Smoker    Types: Cigarettes  . Smokeless tobacco: Never Used  . Alcohol Use: Yes  . Drug Use: No  . Sexually Active: Yes    Birth Control/ Protection: None   Other Topics Concern  . 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 Ex smoker  Stopped [id number] cats and a dog     Outpatient Encounter Prescriptions as of 07/10/2012  Medication Sig Dispense Refill  . citalopram (CELEXA) 20 MG tablet Take 20 mg by mouth daily. 1 daily as directed prn      . hydrochlorothiazide (HYDRODIURIL) 25 MG tablet Take 1 tablet (25 mg total) by mouth daily.  90 tablet  1  . zolpidem (AMBIEN) 10 MG tablet Take 1 tablet (10 mg total) by mouth at bedtime as needed for sleep.  90 tablet  1  . [DISCONTINUED] hydrochlorothiazide (HYDRODIURIL) 25 MG tablet Take 1 tablet (25 mg total) by mouth daily.  90 tablet  1  . cefUROXime (CEFTIN) 500 MG tablet Take 1 tablet (500 mg total) by mouth 2 (two) times daily.  20 tablet  0  . celecoxib (CELEBREX) 200 MG capsule Take 1 capsule (200 mg total) by mouth daily.  30 capsule  2  . LORazepam (ATIVAN) 0.5 MG tablet 1-2 pre flight  15 tablet  0    EXAM:  BP 130/80  Pulse 114  Temp 98.9 F (37.2 C) (Oral)  Wt 217 lb (98.431 kg)  SpO2 99%  There is no height on file to calculate BMI.  GENERAL: vitals reviewed and listed above, alert, oriented, appears well hydrated and in no acute distress She is nontoxic but looks like she feels mildly ill. HEENT: atraumatic, conjunctiva  clear, no obvious abnormalities on inspection of external nose and ears OP : no lesion edema or exudate  NECK: no obvious masses on inspection palpation  no adenopathy LUNGS: clear to auscultation bilaterally, no wheezes, rales or rhonchi, good air movement CV: HRRR, no gallops or murmurs no clubbing cyanosis or  peripheral edema nl cap refill  Abdomen:  Sof,t normal bowel sounds without hepatosplenomegaly, no guarding rebound or masses no CVA tenderness points to the left side as the area of discomfort. No psoas sign. MS: moves all extremities without noticeable focal  abnormality no acute swelling or redness  PSYCH: pleasant and cooperative, no obvious depression or  anxiety  ASSESSMENT AND PLAN:  Discussed the following assessment and plan:  1. Abdominal pain  POC Urinalysis Dipstick, Urine culture, Urine culture  2. UTI (urinary tract infection)     poss early pyelo by sx and context   3. Back pain  POC Urinalysis Dipstick, Urine culture, Urine culture  4. HYPERTENSION    5. Arthritis     Taking Tylenol lower dose not as helpful as higher dose asks for Celebrex trial. Naproxen not that helpful. Has been on Celebrex in the remote past thoughthelpe  6. Benign hypertensive heart disease without heart failure  hydrochlorothiazide (HYDRODIURIL) 25 MG tablet   Risk benefit of medications discussed. On disease states    -Patient advised to return or notify health care team  if symptoms worsen or persist or new concerns arise.  Patient Instructions  I am concerned that you could be developing a kidney infection. Will begin on antibiotic and have you followup next week unless she were worse in the meantime. Will inform  about the culture test when they are back.    Neta Mends. Panosh M.D.

## 2012-07-10 NOTE — Patient Instructions (Addendum)
I am concerned that you could be developing a kidney infection. Will begin on antibiotic and have you followup next week unless she were worse in the meantime. Will inform  about the culture test when they are back.

## 2012-07-12 LAB — URINE CULTURE: Colony Count: >=100000

## 2012-07-18 ENCOUNTER — Ambulatory Visit: Payer: 59 | Admitting: Internal Medicine

## 2012-08-25 ENCOUNTER — Other Ambulatory Visit: Payer: Self-pay | Admitting: Internal Medicine

## 2012-08-25 NOTE — Telephone Encounter (Signed)
Pt is requesting refills  Last filled: 02/21/12 #90 with 1 additional refill Last seen: 07/10/12 for multiple issues No follow up scheduled  Please advise.  Thanks!!

## 2012-08-25 NOTE — Telephone Encounter (Signed)
Was du for med check OV  But since seen in January can refill 90   OV before more refills

## 2012-09-02 ENCOUNTER — Other Ambulatory Visit: Payer: Self-pay | Admitting: Internal Medicine

## 2012-10-27 ENCOUNTER — Other Ambulatory Visit: Payer: Self-pay | Admitting: Internal Medicine

## 2012-11-26 ENCOUNTER — Other Ambulatory Visit: Payer: Self-pay | Admitting: Internal Medicine

## 2012-11-28 NOTE — Telephone Encounter (Signed)
Can refill 90 days  X 1  Needs ov  Before further refills

## 2012-11-28 NOTE — Telephone Encounter (Signed)
Last filled on 08/25/12 #90 with 0 additional refills Last seen acute visit on 07/10/12 No follow up scheduled. Please advise.  Thanks!!

## 2012-12-27 ENCOUNTER — Other Ambulatory Visit: Payer: Self-pay | Admitting: Internal Medicine

## 2012-12-27 NOTE — Telephone Encounter (Signed)
Ok to refill  X 2 .  Needs yearly OV and labs   Before runs out.

## 2012-12-27 NOTE — Telephone Encounter (Signed)
Last filled on 07/10/12 #30 with 2 additional refills Last seen on that date No future appt Please advise Thanks!!

## 2013-02-14 ENCOUNTER — Other Ambulatory Visit (INDEPENDENT_AMBULATORY_CARE_PROVIDER_SITE_OTHER): Payer: 59

## 2013-02-14 DIAGNOSIS — Z0000 Encounter for general adult medical examination without abnormal findings: Secondary | ICD-10-CM

## 2013-02-14 LAB — BASIC METABOLIC PANEL
BUN: 11 mg/dL (ref 6–23)
Creatinine, Ser: 0.8 mg/dL (ref 0.4–1.2)
GFR: 76.22 mL/min (ref >60.00)
Potassium: 3.8 mEq/L (ref 3.5–5.1)

## 2013-02-14 LAB — CBC WITH DIFFERENTIAL/PLATELET
Basophils Relative: 0.7 % (ref 0.0–3.0)
Eosinophils Absolute: 0.2 10*3/uL (ref 0.0–0.7)
MCHC: 34.2 g/dL (ref 30.0–36.0)
MCV: 87.3 fl (ref 78.0–100.0)
Monocytes Absolute: 0.5 10*3/uL (ref 0.1–1.0)
Neutrophils Relative %: 46.3 % (ref 43.0–77.0)
RBC: 4.87 Mil/uL (ref 3.87–5.11)

## 2013-02-14 LAB — LIPID PANEL
Cholesterol: 199 mg/dL (ref 0–200)
VLDL: 38.4 mg/dL (ref 0.0–40.0)

## 2013-02-14 LAB — HEPATIC FUNCTION PANEL
ALT: 19 U/L (ref 0–35)
AST: 17 U/L (ref 0–37)
Alkaline Phosphatase: 51 U/L (ref 39–117)
Bilirubin, Direct: 0.1 mg/dL (ref 0.0–0.3)
Total Bilirubin: 1 mg/dL (ref 0.3–1.2)

## 2013-02-20 ENCOUNTER — Ambulatory Visit (INDEPENDENT_AMBULATORY_CARE_PROVIDER_SITE_OTHER): Payer: 59 | Admitting: Internal Medicine

## 2013-02-20 ENCOUNTER — Encounter: Payer: Self-pay | Admitting: Internal Medicine

## 2013-02-20 VITALS — BP 124/70 | HR 90 | Temp 98.6°F | Ht 63.75 in | Wt 221.0 lb

## 2013-02-20 DIAGNOSIS — I10 Essential (primary) hypertension: Secondary | ICD-10-CM

## 2013-02-20 DIAGNOSIS — G4700 Insomnia, unspecified: Secondary | ICD-10-CM

## 2013-02-20 DIAGNOSIS — Z23 Encounter for immunization: Secondary | ICD-10-CM

## 2013-02-20 DIAGNOSIS — Z0000 Encounter for general adult medical examination without abnormal findings: Secondary | ICD-10-CM

## 2013-02-20 DIAGNOSIS — F4321 Adjustment disorder with depressed mood: Secondary | ICD-10-CM

## 2013-02-20 DIAGNOSIS — E785 Hyperlipidemia, unspecified: Secondary | ICD-10-CM

## 2013-02-20 DIAGNOSIS — I119 Hypertensive heart disease without heart failure: Secondary | ICD-10-CM

## 2013-02-20 MED ORDER — HYDROCHLOROTHIAZIDE 25 MG PO TABS: 25.0000 mg | ORAL_TABLET | Freq: Every day | ORAL | Status: DC

## 2013-02-20 MED ORDER — ZOLPIDEM TARTRATE 10 MG PO TABS: ORAL_TABLET | ORAL | Status: DC

## 2013-02-20 NOTE — Progress Notes (Signed)
Chief Complaint  Patient presents with  . Annual Exam    HPI: Patient comes in today for Preventive Health Care visit  No major change in health status since last visit . Non smoking  ocass etoh some caffiene with sugar .  Bp good taking med Sleep still taking ambien 10  Tried melatonin and had drowsy the next day !. Denies se of ambien  Only takees celexa prn? In winter   Poss seasonal depressive sx.  Some dizziness when does this   ROS:  GEN/ HEENT: No fever, significant weight changes sweats headaches vision problems hearing changes, CV/ PULM; No chest pain shortness of breath cough, syncope,edema  change in exercise tolerance. GI /GU: No adominal pain, vomiting, change in bowel habits. No blood in the stool. No significant GU symptoms. SKIN/HEME: ,no acute skin rashes suspicious lesions or bleeding. No lymphadenopathy, nodules, masses.  NEURO/ PSYCH:  No neurologic signs such as weakness numbness. No depression anxiety. IMM/ Allergy: No unusual infections.  Allergy .   REST of 12 system review negative except as per HPI   Past Medical History  Diagnosis Date  . OSA (obstructive sleep apnea)     moderatley severe  . Chest pain     atypical  . Hypertension   . GERD (gastroesophageal reflux disease)   . Allergy   . Nasal septal deviation   . Odontogenic cyst     of left maxillary sinus  . History of pneumonia   . CHEST DISCOMFORT 08/30/2007    Qualifier: Diagnosis of  By: Fabian Sharp MD, Neta Mends   . OBSTRUCTIVE SLEEP APNEA 08/30/2007    Qualifier: Diagnosis of  By: Fabian Sharp MD, Neta Mends     Family History  Problem Relation Age of Onset  . Heart disease Mother   . Stroke Father   . Heart failure Father   . Emphysema Father   . Snoring Brother   . Lung cancer Brother   . Rheum arthritis Daughter     History   Social History  . Marital Status: Married    Spouse Name: N/A    Number of Children: N/A  . Years of Education: N/A   Social History Main Topics  . Smoking  status: Former Smoker    Types: Cigarettes  . Smokeless tobacco: Never Used  . Alcohol Use: Yes  . Drug Use: No  . Sexual Activity: Yes    Birth Control/ Protection: None   Other Topics Concern  . 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 CELEBREX 200 MG capsule TAKE 1 CAPSULE BY MOUTH DAILY  30 capsule  2  . citalopram (CELEXA) 20 MG tablet Take 20 mg by mouth daily. 1 daily as directed prn      . hydrochlorothiazide (HYDRODIURIL) 25 MG tablet Take 1 tablet (25 mg total) by mouth daily.  90 tablet  3  . zolpidem (AMBIEN) 10 MG tablet TAKE 1 TABLET BY MOUTH ONCE DAILY AT BEDTIME AS NEEDED SLEEP  90  tablet  1  . [DISCONTINUED] hydrochlorothiazide (HYDRODIURIL) 25 MG tablet Take 1 tablet (25 mg total) by mouth daily.  90 tablet  1  . [DISCONTINUED] zolpidem (AMBIEN) 10 MG tablet TAKE 1 TABLET BY MOUTH ONCE DAILY AT BEDTIME AS NEEDED SLEEP  90 tablet  0  . LORazepam (ATIVAN) 0.5 MG tablet TAKE 1 TO 2 TABLETS BY MOUTH BEFORE FLIGHT, THEN AS DIRECTED  15 tablet  0  . [DISCONTINUED] cefUROXime (CEFTIN) 500 MG tablet Take 1 tablet (500 mg total) by mouth 2 (two) times daily.  20 tablet  0   No facility-administered encounter medications on file as of 02/20/2013.    EXAM:  BP 124/70  Pulse 90  Temp(Src) 98.6 F (37 C) (Oral)  Ht 5' 3.75" (1.619 m)  Wt 221 lb (100.245 kg)  BMI 38.24 kg/m2  SpO2 98%  Body mass index is 38.24 kg/(m^2).  Physical Exam: Vital signs reviewed [id number] is a well-developed well-nourished alert cooperative   female who appears her stated age in no acute distress.  HEENT: normocephalic atraumatic , Eyes: PERRL EOM's full, conjunctiva  clear, Nares: paten,t no deformity discharge or tenderness., Ears: no deformity EAC's clear TMs with normal landmarks. Mouth: clear OP, no lesions, edema.  Moist mucous membranes. Dentition in adequate repair. NECK: supple without masses, thyromegaly or bruits. Breast: normal by inspection . No dimpling, discharge, masses, tenderness or discharge . CHEST/PULM:  Clear to auscultation and percussion breath sounds equal no wheeze , rales or rhonchi. No chest wall deformities or tenderness. CV: PMI is nondisplaced, S1 S2 no gallops, murmurs, rubs. Peripheral pulses are full without delay.No JVD .  ABDOMEN: Bowel sounds normal nontender  No guard or rebound, no hepato splenomegal no CVA tenderness.  No hernia. Extremtities:  No clubbing cyanosis or edema, no acute joint swelling or redness no focal atrophy NEURO:  Oriented x3, cranial nerves 3-12 appear to be intact, no obvious focal weakness,gait within normal limits no abnormal reflexes or asymmetrical SKIN: No acute rashes normal turgor, color, no bruising or petechiae. PSYCH: Oriented, good eye contact, no obvious depression anxiety, cognition and judgment appear normal. LN: no cervical axillary inguinal adenopathy  Lab Results  Component Value Date   WBC 5.5 02/14/2013   HGB 14.5 02/14/2013   HCT 42.5 02/14/2013   PLT 214.0 02/14/2013   GLUCOSE 96 02/14/2013   CHOL 199 02/14/2013   TRIG 192.0* 02/14/2013   HDL 45.10 02/14/2013   LDLDIRECT 121.8 02/26/2011   LDLCALC 116* 02/14/2013   ALT 19 02/14/2013   AST 17 02/14/2013   NA 137 02/14/2013   K 3.8 02/14/2013   CL 105 02/14/2013   CREATININE 0.8 02/14/2013   BUN 11 02/14/2013   CO2 26 02/14/2013   TSH 1.70 02/14/2013    ASSESSMENT AND PLAN:  Discussed the following assessment and plan:  Routine general medical examination at a health care facility  HYPERTENSION  Need for prophylactic vaccination and inoculation against influenza - Plan: Flu Vaccine QUAD 36+ mos PF IM (Fluarix)  Need for prophylactic  vaccination against Streptococcus pneumoniae (pneumococcus) - Plan: Pneumococcal polysaccharide vaccine 23-valent greater than or equal to 2yo subcutaneous/IM  Benign hypertensive heart disease without heart failure - Plan: hydrochlorothiazide (HYDRODIURIL) 25 MG tablet  HYPERLIPIDEMIA  INSOMNIA  Adjustment disorder with depressed mood - seasonal  tg could be better   Risk benefit of medication discussed. Can try melatonin 3-4 hours pre bed and trial dec to 5 ambien rov in 6 months   Patient Care Team:  Madelin Headings, MD as PCP - General Cassell Clement, MD (Cardiology) Patient Instructions  Continue lifestyle intervention healthy eating and exercise .  Preventive Care for Adults, Female A healthy lifestyle and preventive care can promote health and wellness. Preventive health guidelines for women include the following key practices.  A routine yearly physical is a good way to check with your caregiver about your health and preventive screening. It is a chance to share any concerns and updates on your health, and to receive a thorough exam.  Visit your dentist for a routine exam and preventive care every 6 months. Brush your teeth twice a day and floss once a day. Good oral hygiene prevents tooth decay and gum disease.  The frequency of eye exams is based on your age, health, family medical history, use of contact lenses, and other factors. Follow your caregiver's recommendations for frequency of eye exams.  Eat a healthy diet. Foods like vegetables, fruits, whole grains, low-fat dairy products, and lean protein foods contain the nutrients you need without too many calories. Decrease your intake of foods high in solid fats, added sugars, and salt. Eat the right amount of calories for you.Get information about a proper diet from your caregiver, if necessary.  Regular physical exercise is one of the most important things you can do for your health. Most adults should get at least 150  minutes of moderate-intensity exercise (any activity that increases your heart rate and causes you to sweat) each week. In addition, most adults need muscle-strengthening exercises on 2 or more days a week.  Maintain a healthy weight. The body mass index (BMI) is a screening tool to identify possible weight problems. It provides an estimate of body fat based on height and weight. Your caregiver can help determine your BMI, and can help you achieve or maintain a healthy weight.For adults 20 years and older:  A BMI below 18.5 is considered underweight.  A BMI of 18.5 to 24.9 is normal.  A BMI of 25 to 29.9 is considered overweight.  A BMI of 30 and above is considered obese.  Maintain normal blood lipids and cholesterol levels by exercising and minimizing your intake of saturated fat. Eat a balanced diet with plenty of fruit and vegetables. Blood tests for lipids and cholesterol should begin at age 8 and be repeated every 5 years. If your lipid or cholesterol levels are high, you are over 50, or you are at high risk for heart disease, you may need your cholesterol levels checked more frequently.Ongoing high lipid and cholesterol levels should be treated with medicines if diet and exercise are not effective.  If you smoke, find out from your caregiver how to quit. If you do not use tobacco, do not start.  If you are pregnant, do not drink alcohol. If you are breastfeeding, be very cautious about drinking alcohol. If you are not pregnant and choose to drink alcohol, do not exceed 1 drink per day. One drink is considered to be 12 ounces (355 mL) of beer, 5 ounces (148 mL) of wine, or 1.5 ounces (44 mL) of liquor.  Avoid use of street drugs. Do not share needles with anyone. Ask for help if you need support or instructions about stopping the use of drugs.  High blood pressure causes heart disease and increases the risk of stroke. Your blood pressure should be checked at least every 1 to 2 years.  Ongoing high blood pressure should be treated with medicines if weight loss and exercise  are not effective.  If you are 8 to 62 years old, ask your caregiver if you should take aspirin to prevent strokes.  Diabetes screening involves taking a blood sample to check your fasting blood sugar level. This should be done once every 3 years, after age 60, if you are within normal weight and without risk factors for diabetes. Testing should be considered at a younger age or be carried out more frequently if you are overweight and have at least 1 risk factor for diabetes.  Breast cancer screening is essential preventive care for women. You should practice "breast self-awareness." This means understanding the normal appearance and feel of your breasts and may include breast self-examination. Any changes detected, no matter how small, should be reported to a caregiver. Women in their 21s and 30s should have a clinical breast exam (CBE) by a caregiver as part of a regular health exam every 1 to 3 years. After age 10, women should have a CBE every year. Starting at age 31, women should consider having a mammography (breast X-ray test) every year. Women who have a family history of breast cancer should talk to their caregiver about genetic screening. Women at a high risk of breast cancer should talk to their caregivers about having magnetic resonance imaging (MRI) and a mammography every year.  The Pap test is a screening test for cervical cancer. A Pap test can show cell changes on the cervix that might become cervical cancer if left untreated. A Pap test is a procedure in which cells are obtained and examined from the lower end of the uterus (cervix).  Women should have a Pap test starting at age 58.  Between ages 22 and 71, Pap tests should be repeated every 2 years.  Beginning at age 37, you should have a Pap test every 3 years as long as the past 3 Pap tests have been normal.  Some women have medical  problems that increase the chance of getting cervical cancer. Talk to your caregiver about these problems. It is especially important to talk to your caregiver if a new problem develops soon after your last Pap test. In these cases, your caregiver may recommend more frequent screening and Pap tests.  The above recommendations are the same for women who have or have not gotten the vaccine for human papillomavirus (HPV).  If you had a hysterectomy for a problem that was not cancer or a condition that could lead to cancer, then you no longer need Pap tests. Even if you no longer need a Pap test, a regular exam is a good idea to make sure no other problems are starting.  If you are between ages 65 and 40, and you have had normal Pap tests going back 10 years, you no longer need Pap tests. Even if you no longer need a Pap test, a regular exam is a good idea to make sure no other problems are starting.  If you have had past treatment for cervical cancer or a condition that could lead to cancer, you need Pap tests and screening for cancer for at least 20 years after your treatment.  If Pap tests have been discontinued, risk factors (such as a new sexual partner) need to be reassessed to determine if screening should be resumed.  The HPV test is an additional test that may be used for cervical cancer screening. The HPV test looks for the virus that can cause the cell changes on the cervix. The cells collected during  the Pap test can be tested for HPV. The HPV test could be used to screen women aged 39 years and older, and should be used in women of any age who have unclear Pap test results. After the age of 67, women should have HPV testing at the same frequency as a Pap test.  Colorectal cancer can be detected and often prevented. Most routine colorectal cancer screening begins at the age of 3 and continues through age 85. However, your caregiver may recommend screening at an earlier age if you have risk  factors for colon cancer. On a yearly basis, your caregiver may provide home test kits to check for hidden blood in the stool. Use of a small camera at the end of a tube, to directly examine the colon (sigmoidoscopy or colonoscopy), can detect the earliest forms of colorectal cancer. Talk to your caregiver about this at age 7, when routine screening begins. Direct examination of the colon should be repeated every 5 to 10 years through age 27, unless early forms of pre-cancerous polyps or small growths are found.  Hepatitis C blood testing is recommended for all people born from [birth year] through 1965 and any individual with known risks for hepatitis C.  Practice safe sex. Use condoms and avoid high-risk sexual practices to reduce the spread of sexually transmitted infections (STIs). STIs include gonorrhea, chlamydia, syphilis, trichomonas, herpes, HPV, and human immunodeficiency virus (HIV). Herpes, HIV, and HPV are viral illnesses that have no cure. They can result in disability, cancer, and death. Sexually active women aged 64 and younger should be checked for chlamydia. Older women with new or multiple partners should also be tested for chlamydia. Testing for other STIs is recommended if you are sexually active and at increased risk.  Osteoporosis is a disease in which the bones lose minerals and strength with aging. This can result in serious bone fractures. The risk of osteoporosis can be identified using a bone density scan. Women ages 75 and over and women at risk for fractures or osteoporosis should discuss screening with their caregivers. Ask your caregiver whether you should take a calcium supplement or vitamin D to reduce the rate of osteoporosis.  Menopause can be associated with physical symptoms and risks. Hormone replacement therapy is available to decrease symptoms and risks. You should talk to your caregiver about whether hormone replacement therapy is right for you.  Use sunscreen with sun  protection factor (SPF) of 30 or more. Apply sunscreen liberally and repeatedly throughout the day. You should seek shade when your shadow is shorter than you. Protect yourself by wearing long sleeves, pants, a wide-brimmed hat, and sunglasses year round, whenever you are outdoors.  Once a month, do a whole body skin exam, using a mirror to look at the skin on your back. Notify your caregiver of new moles, moles that have irregular borders, moles that are larger than a pencil eraser, or moles that have changed in shape or color.  Stay current with required immunizations.  Influenza. You need a dose every fall (or winter). The composition of the flu vaccine changes each year, so being vaccinated once is not enough.  Pneumococcal polysaccharide. You need 1 to 2 doses if you smoke cigarettes or if you have certain chronic medical conditions. You need 1 dose at age 48 (or older) if you have never been vaccinated.  Tetanus, diphtheria, pertussis (Tdap, Td). Get 1 dose of Tdap vaccine if you are younger than age 54, are over 61 and have contact  with an infant, are a Research scientist (physical sciences), are pregnant, or simply want to be protected from whooping cough. After that, you need a Td booster dose every 10 years. Consult your caregiver if you have not had at least 3 tetanus and diphtheria-containing shots sometime in your life or have a deep or dirty wound.  HPV. You need this vaccine if you are a woman age 20 or younger. The vaccine is given in 3 doses over 6 months.  Measles, mumps, rubella (MMR). You need at least 1 dose of MMR if you were born in 1957 or later. You may also need a second dose.  Meningococcal. If you are age 75 to 7 and a first-year college student living in a residence hall, or have one of several medical conditions, you need to get vaccinated against meningococcal disease. You may also need additional booster doses.  Zoster (shingles). If you are age 30 or older, you should get this  vaccine.  Varicella (chickenpox). If you have never had chickenpox or you were vaccinated but received only 1 dose, talk to your caregiver to find out if you need this vaccine.  Hepatitis A. You need this vaccine if you have a specific risk factor for hepatitis A virus infection or you simply wish to be protected from this disease. The vaccine is usually given as 2 doses, 6 to 18 months apart.  Hepatitis B. You need this vaccine if you have a specific risk factor for hepatitis B virus infection or you simply wish to be protected from this disease. The vaccine is given in 3 doses, usually over 6 months. Preventive Services / Frequency Ages 19 to 14  Blood pressure check.** / Every 1 to 2 years.  Lipid and cholesterol check.** / Every 5 years beginning at age 30.  Clinical breast exam.** / Every 3 years for women in their 51s and 30s.  Pap test.** / Every 2 years from ages 28 through 36. Every 3 years starting at age 66 through age 71 or 55 with a history of 3 consecutive normal Pap tests.  HPV screening.** / Every 3 years from ages 32 through ages 13 to 32 with a history of 3 consecutive normal Pap tests.  Hepatitis C blood test.** / For any individual with known risks for hepatitis C.  Skin self-exam. / Monthly.  Influenza immunization.** / Every year.  Pneumococcal polysaccharide immunization.** / 1 to 2 doses if you smoke cigarettes or if you have certain chronic medical conditions.  Tetanus, diphtheria, pertussis (Tdap, Td) immunization. / A one-time dose of Tdap vaccine. After that, you need a Td booster dose every 10 years.  HPV immunization. / 3 doses over 6 months, if you are 29 and younger.  Measles, mumps, rubella (MMR) immunization. / You need at least 1 dose of MMR if you were born in 1957 or later. You may also need a second dose.  Meningococcal immunization. / 1 dose if you are age 33 to 69 and a first-year college student living in a residence hall, or have one of  several medical conditions, you need to get vaccinated against meningococcal disease. You may also need additional booster doses.  Varicella immunization.** / Consult your caregiver.  Hepatitis A immunization.** / Consult your caregiver. 2 doses, 6 to 18 months apart.  Hepatitis B immunization.** / Consult your caregiver. 3 doses usually over 6 months. Ages 37 to 78  Blood pressure check.** / Every 1 to 2 years.  Lipid and cholesterol check.** / Every  5 years beginning at age 2.  Clinical breast exam.** / Every year after age 52.  Mammogram.** / Every year beginning at age 65 and continuing for as long as you are in good health. Consult with your caregiver.  Pap test.** / Every 3 years starting at age 30 through age 75 or 19 with a history of 3 consecutive normal Pap tests.  HPV screening.** / Every 3 years from ages 78 through ages 49 to 29 with a history of 3 consecutive normal Pap tests.  Fecal occult blood test (FOBT) of stool. / Every year beginning at age 40 and continuing until age 60. You may not need to do this test if you get a colonoscopy every 10 years.  Flexible sigmoidoscopy or colonoscopy.** / Every 5 years for a flexible sigmoidoscopy or every 10 years for a colonoscopy beginning at age 77 and continuing until age 8.  Hepatitis C blood test.** / For all people born from [birth year] through 1965 and any individual with known risks for hepatitis C.  Skin self-exam. / Monthly.  Influenza immunization.** / Every year.  Pneumococcal polysaccharide immunization.** / 1 to 2 doses if you smoke cigarettes or if you have certain chronic medical conditions.  Tetanus, diphtheria, pertussis (Tdap, Td) immunization.** / A one-time dose of Tdap vaccine. After that, you need a Td booster dose every 10 years.  Measles, mumps, rubella (MMR) immunization. / You need at least 1 dose of MMR if you were born in 1957 or later. You may also need a second dose.  Varicella immunization.** /  Consult your caregiver.  Meningococcal immunization.** / Consult your caregiver.  Hepatitis A immunization.** / Consult your caregiver. 2 doses, 6 to 18 months apart.  Hepatitis B immunization.** / Consult your caregiver. 3 doses, usually over 6 months. Ages 65 and over  Blood pressure check.** / Every 1 to 2 years.  Lipid and cholesterol check.** / Every 5 years beginning at age 29.  Clinical breast exam.** / Every year after age 8.  Mammogram.** / Every year beginning at age 86 and continuing for as long as you are in good health. Consult with your caregiver.  Pap test.** / Every 3 years starting at age 41 through age 38 or 8 with a 3 consecutive normal Pap tests. Testing can be stopped between 65 and 70 with 3 consecutive normal Pap tests and no abnormal Pap or HPV tests in the past 10 years.  HPV screening.** / Every 3 years from ages 21 through ages 68 or 25 with a history of 3 consecutive normal Pap tests. Testing can be stopped between 65 and 70 with 3 consecutive normal Pap tests and no abnormal Pap or HPV tests in the past 10 years.  Fecal occult blood test (FOBT) of stool. / Every year beginning at age 26 and continuing until age 23. You may not need to do this test if you get a colonoscopy every 10 years.  Flexible sigmoidoscopy or colonoscopy.** / Every 5 years for a flexible sigmoidoscopy or every 10 years for a colonoscopy beginning at age 63 and continuing until age 64.  Hepatitis C blood test.** / For all people born from [birth year] through 1965 and any individual with known risks for hepatitis C.  Osteoporosis screening.** / A one-time screening for women ages 28 and over and women at risk for fractures or osteoporosis.  Skin self-exam. / Monthly.  Influenza immunization.** / Every year.  Pneumococcal polysaccharide immunization.** / 1 dose at age 6 (or older)  if you have never been vaccinated.  Tetanus, diphtheria, pertussis (Tdap, Td) immunization. / A one-time dose  of Tdap vaccine if you are over 65 and have contact with an infant, are a Research scientist (physical sciences), or simply want to be protected from whooping cough. After that, you need a Td booster dose every 10 years.  Varicella immunization.** / Consult your caregiver.  Meningococcal immunization.** / Consult your caregiver.  Hepatitis A immunization.** / Consult your caregiver. 2 doses, 6 to 18 months apart.  Hepatitis B immunization.** / Check with your caregiver. 3 doses, usually over 6 months. ** Family history and personal history of risk and conditions may change your caregiver's recommendations. Document Released: 07/27/2001 Document Revised: 08/23/2011 Document Reviewed: 10/26/2010 [facility] Patient Information 2014 [place], [place].     Neta Mends. Panosh M.D. Health Maintenance  Topic Date Due  . Influenza Vaccine  01/12/2014  . Pap Smear  02/25/2014  . Mammogram  06/20/2014  . Tetanus/tdap  06/09/2020  . Colonoscopy  02/23/2021  . Zostavax  Completed   Health Maintenance Review }

## 2013-02-20 NOTE — Patient Instructions (Signed)
Continue lifestyle intervention healthy eating and exercise .  Preventive Care for Adults, Female A healthy lifestyle and preventive care can promote health and wellness. Preventive health guidelines for women include the following key practices.  A routine yearly physical is a good way to check with your caregiver about your health and preventive screening. It is a chance to share any concerns and updates on your health, and to receive a thorough exam.  Visit your dentist for a routine exam and preventive care every 6 months. Brush your teeth twice a day and floss once a day. Good oral hygiene prevents tooth decay and gum disease.  The frequency of eye exams is based on your age, health, family medical history, use of contact lenses, and other factors. Follow your caregiver's recommendations for frequency of eye exams.  Eat a healthy diet. Foods like vegetables, fruits, whole grains, low-fat dairy products, and lean protein foods contain the nutrients you need without too many calories. Decrease your intake of foods high in solid fats, added sugars, and salt. Eat the right amount of calories for you.Get information about a proper diet from your caregiver, if necessary.  Regular physical exercise is one of the most important things you can do for your health. Most adults should get at least 150 minutes of moderate-intensity exercise (any activity that increases your heart rate and causes you to sweat) each week. In addition, most adults need muscle-strengthening exercises on 2 or more days a week.  Maintain a healthy weight. The body mass index (BMI) is a screening tool to identify possible weight problems. It provides an estimate of body fat based on height and weight. Your caregiver can help determine your BMI, and can help you achieve or maintain a healthy weight.For adults 20 years and older:  A BMI below 18.5 is considered underweight.  A BMI of 18.5 to 24.9 is normal.  A BMI of 25 to  29.9 is considered overweight.  A BMI of 30 and above is considered obese.  Maintain normal blood lipids and cholesterol levels by exercising and minimizing your intake of saturated fat. Eat a balanced diet with plenty of fruit and vegetables. Blood tests for lipids and cholesterol should begin at age 66 and be repeated every 5 years. If your lipid or cholesterol levels are high, you are over 50, or you are at high risk for heart disease, you may need your cholesterol levels checked more frequently.Ongoing high lipid and cholesterol levels should be treated with medicines if diet and exercise are not effective.  If you smoke, find out from your caregiver how to quit. If you do not use tobacco, do not start.  If you are pregnant, do not drink alcohol. If you are breastfeeding, be very cautious about drinking alcohol. If you are not pregnant and choose to drink alcohol, do not exceed 1 drink per day. One drink is considered to be 12 ounces (355 mL) of beer, 5 ounces (148 mL) of wine, or 1.5 ounces (44 mL) of liquor.  Avoid use of street drugs. Do not share needles with anyone. Ask for help if you need support or instructions about stopping the use of drugs.  High blood pressure causes heart disease and increases the risk of stroke. Your blood pressure should be checked at least every 1 to 2 years. Ongoing high blood pressure should be treated with medicines if weight loss and exercise are not effective.  If you are 38 to 62 years old, ask your caregiver if  you should take aspirin to prevent strokes.  Diabetes screening involves taking a blood sample to check your fasting blood sugar level. This should be done once every 3 years, after age 62, if you are within normal weight and without risk factors for diabetes. Testing should be considered at a younger age or be carried out more frequently if you are overweight and have at least 1 risk factor for diabetes.  Breast cancer screening is essential  preventive care for women. You should practice "breast self-awareness." This means understanding the normal appearance and feel of your breasts and may include breast self-examination. Any changes detected, no matter how small, should be reported to a caregiver. Women in their 35s and 30s should have a clinical breast exam (CBE) by a caregiver as part of a regular health exam every 1 to 3 years. After age 67, women should have a CBE every year. Starting at age 53, women should consider having a mammography (breast X-ray test) every year. Women who have a family history of breast cancer should talk to their caregiver about genetic screening. Women at a high risk of breast cancer should talk to their caregivers about having magnetic resonance imaging (MRI) and a mammography every year.  The Pap test is a screening test for cervical cancer. A Pap test can show cell changes on the cervix that might become cervical cancer if left untreated. A Pap test is a procedure in which cells are obtained and examined from the lower end of the uterus (cervix).  Women should have a Pap test starting at age 51.  Between ages 58 and 77, Pap tests should be repeated every 2 years.  Beginning at age 73, you should have a Pap test every 3 years as long as the past 3 Pap tests have been normal.  Some women have medical problems that increase the chance of getting cervical cancer. Talk to your caregiver about these problems. It is especially important to talk to your caregiver if a new problem develops soon after your last Pap test. In these cases, your caregiver may recommend more frequent screening and Pap tests.  The above recommendations are the same for women who have or have not gotten the vaccine for human papillomavirus (HPV).  If you had a hysterectomy for a problem that was not cancer or a condition that could lead to cancer, then you no longer need Pap tests. Even if you no longer need a Pap test, a regular exam is  a good idea to make sure no other problems are starting.  If you are between ages 8 and 44, and you have had normal Pap tests going back 10 years, you no longer need Pap tests. Even if you no longer need a Pap test, a regular exam is a good idea to make sure no other problems are starting.  If you have had past treatment for cervical cancer or a condition that could lead to cancer, you need Pap tests and screening for cancer for at least 20 years after your treatment.  If Pap tests have been discontinued, risk factors (such as a new sexual partner) need to be reassessed to determine if screening should be resumed.  The HPV test is an additional test that may be used for cervical cancer screening. The HPV test looks for the virus that can cause the cell changes on the cervix. The cells collected during the Pap test can be tested for HPV. The HPV test could be used to  screen women aged 14 years and older, and should be used in women of any age who have unclear Pap test results. After the age of 71, women should have HPV testing at the same frequency as a Pap test.  Colorectal cancer can be detected and often prevented. Most routine colorectal cancer screening begins at the age of 32 and continues through age 54. However, your caregiver may recommend screening at an earlier age if you have risk factors for colon cancer. On a yearly basis, your caregiver may provide home test kits to check for hidden blood in the stool. Use of a small camera at the end of a tube, to directly examine the colon (sigmoidoscopy or colonoscopy), can detect the earliest forms of colorectal cancer. Talk to your caregiver about this at age 32, when routine screening begins. Direct examination of the colon should be repeated every 5 to 10 years through age 43, unless early forms of pre-cancerous polyps or small growths are found.  Hepatitis C blood testing is recommended for all people born from [birth year] through 1965 and any individual  with known risks for hepatitis C.  Practice safe sex. Use condoms and avoid high-risk sexual practices to reduce the spread of sexually transmitted infections (STIs). STIs include gonorrhea, chlamydia, syphilis, trichomonas, herpes, HPV, and human immunodeficiency virus (HIV). Herpes, HIV, and HPV are viral illnesses that have no cure. They can result in disability, cancer, and death. Sexually active women aged 6 and younger should be checked for chlamydia. Older women with new or multiple partners should also be tested for chlamydia. Testing for other STIs is recommended if you are sexually active and at increased risk.  Osteoporosis is a disease in which the bones lose minerals and strength with aging. This can result in serious bone fractures. The risk of osteoporosis can be identified using a bone density scan. Women ages 80 and over and women at risk for fractures or osteoporosis should discuss screening with their caregivers. Ask your caregiver whether you should take a calcium supplement or vitamin D to reduce the rate of osteoporosis.  Menopause can be associated with physical symptoms and risks. Hormone replacement therapy is available to decrease symptoms and risks. You should talk to your caregiver about whether hormone replacement therapy is right for you.  Use sunscreen with sun protection factor (SPF) of 30 or more. Apply sunscreen liberally and repeatedly throughout the day. You should seek shade when your shadow is shorter than you. Protect yourself by wearing long sleeves, pants, a wide-brimmed hat, and sunglasses year round, whenever you are outdoors.  Once a month, do a whole body skin exam, using a mirror to look at the skin on your back. Notify your caregiver of new moles, moles that have irregular borders, moles that are larger than a pencil eraser, or moles that have changed in shape or color.  Stay current with required immunizations.  Influenza. You need a dose every fall (or  winter). The composition of the flu vaccine changes each year, so being vaccinated once is not enough.  Pneumococcal polysaccharide. You need 1 to 2 doses if you smoke cigarettes or if you have certain chronic medical conditions. You need 1 dose at age 63 (or older) if you have never been vaccinated.  Tetanus, diphtheria, pertussis (Tdap, Td). Get 1 dose of Tdap vaccine if you are younger than age 97, are over 50 and have contact with an infant, are a Research scientist (physical sciences), are pregnant, or simply want to be protected  from whooping cough. After that, you need a Td booster dose every 10 years. Consult your caregiver if you have not had at least 3 tetanus and diphtheria-containing shots sometime in your life or have a deep or dirty wound.  HPV. You need this vaccine if you are a woman age 71 or younger. The vaccine is given in 3 doses over 6 months.  Measles, mumps, rubella (MMR). You need at least 1 dose of MMR if you were born in 1957 or later. You may also need a second dose.  Meningococcal. If you are age 74 to 13 and a first-year college student living in a residence hall, or have one of several medical conditions, you need to get vaccinated against meningococcal disease. You may also need additional booster doses.  Zoster (shingles). If you are age 54 or older, you should get this vaccine.  Varicella (chickenpox). If you have never had chickenpox or you were vaccinated but received only 1 dose, talk to your caregiver to find out if you need this vaccine.  Hepatitis A. You need this vaccine if you have a specific risk factor for hepatitis A virus infection or you simply wish to be protected from this disease. The vaccine is usually given as 2 doses, 6 to 18 months apart.  Hepatitis B. You need this vaccine if you have a specific risk factor for hepatitis B virus infection or you simply wish to be protected from this disease. The vaccine is given in 3 doses, usually over 6 months. Preventive  Services / Frequency Ages 59 to 73  Blood pressure check.** / Every 1 to 2 years.  Lipid and cholesterol check.** / Every 5 years beginning at age 22.  Clinical breast exam.** / Every 3 years for women in their 37s and 30s.  Pap test.** / Every 2 years from ages 50 through 33. Every 3 years starting at age 30 through age 64 or 6 with a history of 3 consecutive normal Pap tests.  HPV screening.** / Every 3 years from ages 28 through ages 19 to 33 with a history of 3 consecutive normal Pap tests.  Hepatitis C blood test.** / For any individual with known risks for hepatitis C.  Skin self-exam. / Monthly.  Influenza immunization.** / Every year.  Pneumococcal polysaccharide immunization.** / 1 to 2 doses if you smoke cigarettes or if you have certain chronic medical conditions.  Tetanus, diphtheria, pertussis (Tdap, Td) immunization. / A one-time dose of Tdap vaccine. After that, you need a Td booster dose every 10 years.  HPV immunization. / 3 doses over 6 months, if you are 56 and younger.  Measles, mumps, rubella (MMR) immunization. / You need at least 1 dose of MMR if you were born in 1957 or later. You may also need a second dose.  Meningococcal immunization. / 1 dose if you are age 51 to 53 and a first-year college student living in a residence hall, or have one of several medical conditions, you need to get vaccinated against meningococcal disease. You may also need additional booster doses.  Varicella immunization.** / Consult your caregiver.  Hepatitis A immunization.** / Consult your caregiver. 2 doses, 6 to 18 months apart.  Hepatitis B immunization.** / Consult your caregiver. 3 doses usually over 6 months. Ages 43 to 75  Blood pressure check.** / Every 1 to 2 years.  Lipid and cholesterol check.** / Every 5 years beginning at age 27.  Clinical breast exam.** / Every year after age 31.  Mammogram.** / Every year beginning at age 62 and continuing for as long as you  are in good health. Consult with your caregiver.  Pap test.** / Every 3 years starting at age 66 through age 18 or 55 with a history of 3 consecutive normal Pap tests.  HPV screening.** / Every 3 years from ages 33 through ages 50 to 81 with a history of 3 consecutive normal Pap tests.  Fecal occult blood test (FOBT) of stool. / Every year beginning at age 52 and continuing until age 67. You may not need to do this test if you get a colonoscopy every 10 years.  Flexible sigmoidoscopy or colonoscopy.** / Every 5 years for a flexible sigmoidoscopy or every 10 years for a colonoscopy beginning at age 53 and continuing until age 55.  Hepatitis C blood test.** / For all people born from [birth year] through 1965 and any individual with known risks for hepatitis C.  Skin self-exam. / Monthly.  Influenza immunization.** / Every year.  Pneumococcal polysaccharide immunization.** / 1 to 2 doses if you smoke cigarettes or if you have certain chronic medical conditions.  Tetanus, diphtheria, pertussis (Tdap, Td) immunization.** / A one-time dose of Tdap vaccine. After that, you need a Td booster dose every 10 years.  Measles, mumps, rubella (MMR) immunization. / You need at least 1 dose of MMR if you were born in 1957 or later. You may also need a second dose.  Varicella immunization.** / Consult your caregiver.  Meningococcal immunization.** / Consult your caregiver.  Hepatitis A immunization.** / Consult your caregiver. 2 doses, 6 to 18 months apart.  Hepatitis B immunization.** / Consult your caregiver. 3 doses, usually over 6 months. Ages 84 and over  Blood pressure check.** / Every 1 to 2 years.  Lipid and cholesterol check.** / Every 5 years beginning at age 64.  Clinical breast exam.** / Every year after age 74.  Mammogram.** / Every year beginning at age 45 and continuing for as long as you are in good health. Consult with your caregiver.  Pap test.** / Every 3 years starting at age 20  through age 36 or 55 with a 3 consecutive normal Pap tests. Testing can be stopped between 65 and 70 with 3 consecutive normal Pap tests and no abnormal Pap or HPV tests in the past 10 years.  HPV screening.** / Every 3 years from ages 39 through ages 6 or 75 with a history of 3 consecutive normal Pap tests. Testing can be stopped between 65 and 70 with 3 consecutive normal Pap tests and no abnormal Pap or HPV tests in the past 10 years.  Fecal occult blood test (FOBT) of stool. / Every year beginning at age 7 and continuing until age 19. You may not need to do this test if you get a colonoscopy every 10 years.  Flexible sigmoidoscopy or colonoscopy.** / Every 5 years for a flexible sigmoidoscopy or every 10 years for a colonoscopy beginning at age 60 and continuing until age 81.  Hepatitis C blood test.** / For all people born from [birth year] through 1965 and any individual with known risks for hepatitis C.  Osteoporosis screening.** / A one-time screening for women ages 7 and over and women at risk for fractures or osteoporosis.  Skin self-exam. / Monthly.  Influenza immunization.** / Every year.  Pneumococcal polysaccharide immunization.** / 1 dose at age 49 (or older) if you have never been vaccinated.  Tetanus, diphtheria, pertussis (Tdap, Td) immunization. / A one-time  dose of Tdap vaccine if you are over 65 and have contact with an infant, are a Research scientist (physical sciences), or simply want to be protected from whooping cough. After that, you need a Td booster dose every 10 years.  Varicella immunization.** / Consult your caregiver.  Meningococcal immunization.** / Consult your caregiver.  Hepatitis A immunization.** / Consult your caregiver. 2 doses, 6 to 18 months apart.  Hepatitis B immunization.** / Check with your caregiver. 3 doses, usually over 6 months. ** Family history and personal history of risk and conditions may change your caregiver's recommendations. Document Released: 07/27/2001  Document Revised: 08/23/2011 Document Reviewed: 10/26/2010 [facility] Patient Information 2014 [place], [place].

## 2013-03-01 ENCOUNTER — Other Ambulatory Visit: Payer: Self-pay | Admitting: Internal Medicine

## 2013-06-25 ENCOUNTER — Other Ambulatory Visit: Payer: Self-pay | Admitting: Internal Medicine

## 2013-06-27 NOTE — Telephone Encounter (Signed)
Last filled on 10/27/12 #15 with 0 additional refills Is scheduled for a future follow up on 08/20/13. Was last seen in CPE on 02/20/13. Please advise.  Thanks!

## 2013-06-28 NOTE — Telephone Encounter (Signed)
Can refill x 1 (#15 )

## 2013-06-29 ENCOUNTER — Telehealth: Payer: Self-pay | Admitting: Internal Medicine

## 2013-06-29 NOTE — Telephone Encounter (Signed)
Last filled on 10/27/12 #15 with 0 additional refills Has a future appointment scheduled on 08/20/13. Last seen in follow up on 02/20/13 Please advise.  Thanks!

## 2013-06-29 NOTE — Telephone Encounter (Signed)
Pt is requesting a new rx of LORazepam (ATIVAN) 0.5 MG tablet. Please advise.

## 2013-07-18 NOTE — Telephone Encounter (Signed)
i though i answered this already please help  Was ok to refill x 1

## 2013-07-20 NOTE — Telephone Encounter (Signed)
I called and spoke to the Rob at the pharmacy.  He said the patient picked up the prescription on 06/30/13.  I called it in on 06/28/13.  Rx was for #15.  Please advise.  Thanks!

## 2013-08-20 ENCOUNTER — Ambulatory Visit: Payer: 59 | Admitting: Internal Medicine

## 2013-08-23 ENCOUNTER — Encounter: Payer: Self-pay | Admitting: Internal Medicine

## 2013-08-23 ENCOUNTER — Ambulatory Visit (INDEPENDENT_AMBULATORY_CARE_PROVIDER_SITE_OTHER): Payer: 59 | Admitting: Internal Medicine

## 2013-08-23 VITALS — BP 122/80 | Temp 97.9°F | Ht 63.75 in | Wt 227.0 lb

## 2013-08-23 DIAGNOSIS — I10 Essential (primary) hypertension: Secondary | ICD-10-CM

## 2013-08-23 DIAGNOSIS — R300 Dysuria: Secondary | ICD-10-CM

## 2013-08-23 DIAGNOSIS — Z79899 Other long term (current) drug therapy: Secondary | ICD-10-CM | POA: Insufficient documentation

## 2013-08-23 DIAGNOSIS — G4733 Obstructive sleep apnea (adult) (pediatric): Secondary | ICD-10-CM

## 2013-08-23 DIAGNOSIS — G4700 Insomnia, unspecified: Secondary | ICD-10-CM

## 2013-08-23 LAB — POCT URINALYSIS DIPSTICK
BILIRUBIN UA: 1
Glucose, UA: NEGATIVE
Ketones, UA: NEGATIVE
LEUKOCYTES UA: NEGATIVE
Nitrite, UA: NEGATIVE
PH UA: 7
RBC UA: NEGATIVE
Spec Grav, UA: 1.02
Urobilinogen, UA: 1

## 2013-08-23 MED ORDER — ZOLPIDEM TARTRATE 10 MG PO TABS: ORAL_TABLET | ORAL | Status: DC

## 2013-08-23 MED ORDER — NITROFURANTOIN MONOHYD MACRO 100 MG PO CAPS: 100.0000 mg | ORAL_CAPSULE | Freq: Two times a day (BID) | ORAL | Status: DC

## 2013-08-23 NOTE — Patient Instructions (Signed)
Will let you know about culture when back  Take antibiotic in the meantime. Consider other  Reevaluation about sleep apnea dx from past for reasongs discussed . Wellness visit and med check in 6 months

## 2013-08-23 NOTE — Progress Notes (Signed)
Chief Complaint  Patient presents with  . Follow-up    sleep ? uti    HPI: Fu medication management   burning  And odor.    For weeks  No fever dosent feel good.  Like uti last one last year no fever flank pain   Every night takes ambien cause does well   Other options  dont seem to work as well.   bp has been ok .   Not using cpap hard to use  . For yeas  Uncertain data about orig dx  Feels rested in an does snore some using breath right strips.  ROS: See pertinent positives and negatives per HPI.no cp sob   Past Medical History  Diagnosis Date  . OSA (obstructive sleep apnea)     moderatley severe  . Chest pain     atypical  . Hypertension   . GERD (gastroesophageal reflux disease)   . Allergy   . Nasal septal deviation   . Odontogenic cyst     of left maxillary sinus  . History of pneumonia   . CHEST DISCOMFORT 08/30/2007    Qualifier: Diagnosis of  By: Fabian Sharp MD, Neta Mends   . OBSTRUCTIVE SLEEP APNEA 08/30/2007    Qualifier: Diagnosis of  By: Fabian Sharp MD, Neta Mends     Family History  Problem Relation Age of Onset  . Heart disease Mother   . Stroke Father   . Heart failure Father   . Emphysema Father   . Snoring Brother   . Lung cancer Brother   . Rheum arthritis Daughter     History   Social History  . Marital Status: Married    Spouse Name: N/A    Number of Children: N/A  . Years of Education: N/A   Social History Main Topics  . Smoking status: Former Smoker    Types: Cigarettes  . Smokeless tobacco: Never Used  . Alcohol Use: Yes  . Drug Use: No  . Sexual Activity: Yes    Birth Control/ Protection: None   Other Topics Concern  . 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 CELEBREX 200 MG capsule TAKE 1 CAPSULE BY MOUTH DAILY  . citalopram (CELEXA) 20 MG tablet Take 20 mg by mouth daily. 1 daily as directed prn  . hydrochlorothiazide (HYDRODIURIL) 25 MG tablet Take 1 tablet (25 mg total) by mouth daily.  Marland Kitchen LORazepam (ATIVAN) 0.5 MG tablet TAKE 1-2 TABLETS BY MOUTH BEFORE FLIGHT AS NEEDED AND AS DIRECTED  . zolpidem (AMBIEN) 10 MG tablet TAKE 1 TABLET BY MOUTH ONCE DAILY AT BEDTIME AS NEEDED SLEEP  . [DISCONTINUED] zolpidem (AMBIEN) 10 MG tablet TAKE 1 TABLET BY MOUTH ONCE DAILY AT BEDTIME AS NEEDED SLEEP  . nitrofurantoin, macrocrystal-monohydrate, (MACROBID) 100 MG capsule Take 1 capsule (100 mg total) by mouth 2 (two) times daily.    EXAM:  BP 122/80  Temp(Src) 97.9 F (36.6 C) (Oral)  Ht 5' 3.75" (1.619 m)  Wt 227 lb (102.967  kg)  BMI 39.28 kg/m2  Body mass index is 39.28 kg/(m^2).  GENERAL: vitals reviewed and listed above, alert, oriented, appears well hydrated and in no acute distress HEENT: atraumatic, conjunctiva  clear, no obvious abnormalities on inspection of external nose and ears NECK: no obvious masses on inspection palpation  LUNGS: clear to auscultation bilaterally, no wheezes, rales or rhonchi, good air movement CV: HRRR, no clubbing cyanosis or  peripheral edema nl cap refill  Abdomen:  Sof,t normal bowel sounds without hepatosplenomegaly, no guarding rebound or masses no CVA tenderness MS: moves all extremities without noticeable focal  abnormality PSYCH: pleasant and cooperative, no obvious depression or anxiety  ASSESSMENT AND PLAN:  Discussed the following assessment and plan:  Insomnia, unspecified - risk benefot disc refill med x 6 months c  Unspecified essential hypertension  Medication management - contract and tox screen today  Dysuria - sounds like uti but uirne not cw   cand s and empiric rx.  - Plan: POC Urinalysis Dipstick, Urine culture, Urine culture  OBSTRUCTIVE  SLEEP APNEA  -Patient advised to return or notify health care team  if symptoms worsen ,persist or new concerns arise.  Patient Instructions  Will let you know about culture when back  Take antibiotic in the meantime. Consider other  Reevaluation about sleep apnea dx from past for reasongs discussed . Wellness visit and med check in 6 months    Neta MendsWanda K. Bunny Blair M.D.  Pre visit review using our clinic review tool, if applicable. No additional management support is needed unless otherwise documented below in the visit note.

## 2013-08-23 NOTE — Assessment & Plan Note (Signed)
consider reveval  or consult  Esp if sleep and fatigue a factor reviewed risk benefit.

## 2013-08-24 ENCOUNTER — Telehealth: Payer: Self-pay | Admitting: Internal Medicine

## 2013-08-24 NOTE — Telephone Encounter (Signed)
Relevant patient education assigned to patient using Emmi. ° °

## 2013-08-25 LAB — URINE CULTURE
Colony Count: NO GROWTH
Organism ID, Bacteria: NO GROWTH

## 2013-08-27 ENCOUNTER — Other Ambulatory Visit: Payer: Self-pay | Admitting: Internal Medicine

## 2013-08-29 ENCOUNTER — Encounter: Payer: Self-pay | Admitting: Family Medicine

## 2013-09-01 ENCOUNTER — Emergency Department (HOSPITAL_COMMUNITY)
Admission: EM | Admit: 2013-09-01 | Discharge: 2013-09-01 | Disposition: A | Discharge status: Home / Self Care | Payer: 59 | Source: Home / Self Care | Attending: Family Medicine | Admitting: Family Medicine

## 2013-09-01 ENCOUNTER — Emergency Department (INDEPENDENT_AMBULATORY_CARE_PROVIDER_SITE_OTHER): Payer: 59

## 2013-09-01 ENCOUNTER — Encounter (HOSPITAL_COMMUNITY): Payer: Self-pay | Admitting: Emergency Medicine

## 2013-09-01 DIAGNOSIS — R509 Fever, unspecified: Secondary | ICD-10-CM

## 2013-09-01 LAB — POCT URINALYSIS DIP (DEVICE)
GLUCOSE, UA: NEGATIVE mg/dL
LEUKOCYTES UA: NEGATIVE
NITRITE: NEGATIVE
PH: 7 (ref 5.0–8.0)
Protein, ur: 30 mg/dL — AB
Specific Gravity, Urine: 1.02 (ref 1.005–1.030)
Urobilinogen, UA: 2 mg/dL — ABNORMAL HIGH (ref 0.0–1.0)

## 2013-09-01 MED ORDER — DOXYCYCLINE HYCLATE 100 MG PO CAPS: 100.0000 mg | ORAL_CAPSULE | Freq: Two times a day (BID) | ORAL | Status: DC

## 2013-09-01 NOTE — Discharge Instructions (Signed)
Thank you for coming in today. Take doxycycline twice daily for 10 days Use Tylenol, ibuprofen, or Aleve for fever control Followup with your primary care doctor soon If worsening the emergency room.   Fever, Adult A fever is a higher than normal body temperature. In an adult, an oral temperature around 98.6 F (37 C) is considered normal. A temperature of 100.4 F (38 C) or higher is generally considered a fever. Mild or moderate fevers generally have no long-term effects and often do not require treatment. Extreme fever (greater than or equal to 106 F or 41.1 C) can cause seizures. The sweating that may occur with repeated or prolonged fever may cause dehydration. Elderly people can develop confusion during a fever. A measured temperature can vary with:  Age.  Time of day.  Method of measurement (mouth, underarm, rectal, or ear). The fever is confirmed by taking a temperature with a thermometer. Temperatures can be taken different ways. Some methods are accurate and some are not.  An oral temperature is used most commonly. Electronic thermometers are fast and accurate.  An ear temperature will only be accurate if the thermometer is positioned as recommended by the manufacturer.  A rectal temperature is accurate and done for those adults who have a condition where an oral temperature cannot be taken.  An underarm (axillary) temperature is not accurate and not recommended. Fever is a symptom, not a disease.  CAUSES   Infections commonly cause fever.  Some noninfectious causes for fever include:  Some arthritis conditions.  Some thyroid or adrenal gland conditions.  Some immune system conditions.  Some types of cancer.  A medicine reaction.  High doses of certain street drugs such as methamphetamine.  Dehydration.  Exposure to high outside or room temperatures.  Occasionally, the source of a fever cannot be determined. This is sometimes called a "fever of unknown  origin" (FUO).  Some situations may lead to a temporary rise in body temperature that may go away on its own. Examples are:  Childbirth.  Surgery.  Intense exercise. HOME CARE INSTRUCTIONS   Take appropriate medicines for fever. Follow dosing instructions carefully. If you use acetaminophen to reduce the fever, be careful to avoid taking other medicines that also contain acetaminophen. Do not take aspirin for a fever if you are younger than age 33. There is an association with Reye's syndrome. Reye's syndrome is a rare but potentially deadly disease.  If an infection is present and antibiotics have been prescribed, take them as directed. Finish them even if you start to feel better.  Rest as needed.  Maintain an adequate fluid intake. To prevent dehydration during an illness with prolonged or recurrent fever, you may need to drink extra fluid.Drink enough fluids to keep your urine clear or pale yellow.  Sponging or bathing with room temperature water may help reduce body temperature. Do not use ice water or alcohol sponge baths.  Dress comfortably, but do not over-bundle. SEEK MEDICAL CARE IF:   You are unable to keep fluids down.  You develop vomiting or diarrhea.  You are not feeling at least partly better after 3 days.  You develop new symptoms or problems. SEEK IMMEDIATE MEDICAL CARE IF:   You have shortness of breath or trouble breathing.  You develop excessive weakness.  You are dizzy or you faint.  You are extremely thirsty or you are making little or no urine.  You develop new pain that was not there before (such as in the head, neck,  chest, back, or abdomen).  You have persistant vomiting and diarrhea for more than 1 to 2 days.  You develop a stiff neck or your eyes become sensitive to light.  You develop a skin rash.  You have a fever or persistent symptoms for more than 2 to 3 days.  You have a fever and your symptoms suddenly get worse. MAKE SURE YOU:     Understand these instructions.  Will watch your condition.  Will get help right away if you are not doing well or get worse. Document Released: 11/24/2000 Document Revised: 08/23/2011 Document Reviewed: 04/01/2011 [facility] Patient Information 2014 [place], [place].   Tick Bite Information Ticks are insects that attach themselves to the skin and draw blood for food. There are various types of ticks. Common types include wood ticks and deer ticks. Most ticks live in shrubs and grassy areas. Ticks can climb onto your body when you make contact with leaves or grass where the tick is waiting. The most common places on the body for ticks to attach themselves are the scalp, neck, armpits, waist, and groin. Most tick bites are harmless, but sometimes ticks carry germs that cause diseases. These germs can be spread to a person during the tick's feeding process. The chance of a disease spreading through a tick bite depends on:   The type of tick.  Time of year.   How long the tick is attached.   Geographic location.  HOW CAN YOU PREVENT TICK BITES? Take these steps to help prevent tick bites when you are outdoors:  Wear protective clothing. Long sleeves and long pants are best.   Wear white clothes so you can see ticks more easily.  Tuck your pant legs into your socks.   If walking on a trail, stay in the middle of the trail to avoid brushing against bushes.  Avoid walking through areas with long grass.  Put insect repellent on all exposed skin and along boot tops, pant legs, and sleeve cuffs.   Check clothing, hair, and skin repeatedly and before going inside.   Brush off any ticks that are not attached.  Take a shower or bath as soon as possible after being outdoors.  WHAT IS THE PROPER WAY TO REMOVE A TICK? Ticks should be removed as soon as possible to help prevent diseases caused by tick bites. 1. If latex gloves are available, put them on before trying to remove a  tick.  2. Using fine-point tweezers, grasp the tick as close to the skin as possible. You may also use curved forceps or a tick removal tool. Grasp the tick as close to its head as possible. Avoid grasping the tick on its body. 3. Pull gently with steady upward pressure until the tick lets go. Do not twist the tick or jerk it suddenly. This may break off the tick's head or mouth parts. 4. Do not squeeze or crush the tick's body. This could force disease-carrying fluids from the tick into your body.  5. After the tick is removed, wash the bite area and your hands with soap and water or other disinfectant such as alcohol. 6. Apply a small amount of antiseptic cream or ointment to the bite site.  7. Wash and disinfect any instruments that were used.  Do not try to remove a tick by applying a hot match, petroleum jelly, or fingernail polish to the tick. These methods do not work and may increase the chances of disease being spread from the tick bite.  WHEN SHOULD YOU SEEK MEDICAL CARE? Contact your health care provider if you are unable to remove a tick from your skin or if a part of the tick breaks off and is stuck in the skin.  After a tick bite, you need to be aware of signs and symptoms that could be related to diseases spread by ticks. Contact your health care provider if you develop any of the following in the days or weeks after the tick bite:  Unexplained fever.  Rash. A circular rash that appears days or weeks after the tick bite may indicate the possibility of Lyme disease. The rash may resemble a target with a bull's-eye and may occur at a different part of your body than the tick bite.  Redness and swelling in the area of the tick bite.   Tender, swollen lymph glands.   Diarrhea.   Weight loss.   Cough.   Fatigue.   Muscle, joint, or bone pain.   Abdominal pain.   Headache.   Lethargy or a change in your level of consciousness.  Difficulty walking or moving  your legs.   Numbness in the legs.   Paralysis.  Shortness of breath.   Confusion.   Repeated vomiting.  Document Released: 05/28/2000 Document Revised: 03/21/2013 Document Reviewed: 11/08/2012 [facility] Patient Information 2014 [place].

## 2013-09-01 NOTE — ED Provider Notes (Signed)
Rachel Blair is a 63 y.o. female who presents to Urgent Care today for fever. Patient has fever chills headache and fatigue. Symptoms present for 2 days. No cough congestion runny nose or rash. Symptoms started after patient did yard work. She denies any tick bites. She denies any dysuria urgency or frequency. She denies any cough or congestion. No abdominal pain nausea vomiting or diarrhea. She has not tried any medications.   Past Medical History  Diagnosis Date  . OSA (obstructive sleep apnea)     moderatley severe  . Chest pain     atypical  . Hypertension   . GERD (gastroesophageal reflux disease)   . Allergy   . Nasal septal deviation   . Odontogenic cyst     of left maxillary sinus  . History of pneumonia   . CHEST DISCOMFORT 08/30/2007    Qualifier: Diagnosis of  By: Fabian SharpPanosh MD, Neta MendsWanda K   . OBSTRUCTIVE SLEEP APNEA 08/30/2007    Qualifier: Diagnosis of  By: Fabian SharpPanosh MD, Neta MendsWanda K    History  Substance Use Topics  . Smoking status: Former Smoker    Types: Cigarettes  . Smokeless tobacco: Never Used  . Alcohol Use: Yes   ROS as above Medications: No current facility-administered medications for this encounter.   Current Outpatient Prescriptions  Medication Sig Dispense Refill  . hydrochlorothiazide (HYDRODIURIL) 25 MG tablet Take 1 tablet (25 mg total) by mouth daily.  90 tablet  3  . zolpidem (AMBIEN) 10 MG tablet TAKE 1 TABLET BY MOUTH ONCE DAILY AT BEDTIME AS NEEDED SLEEP  90 tablet  1  . CELEBREX 200 MG capsule TAKE 1 CAPSULE BY MOUTH DAILY  30 capsule  2  . citalopram (CELEXA) 20 MG tablet Take 20 mg by mouth daily. 1 daily as directed prn      . doxycycline (VIBRAMYCIN) 100 MG capsule Take 1 capsule (100 mg total) by mouth 2 (two) times daily.  20 capsule  0  . LORazepam (ATIVAN) 0.5 MG tablet TAKE 1-2 TABLETS BY MOUTH BEFORE FLIGHT AS NEEDED AND AS DIRECTED  15 tablet  0  . nitrofurantoin, macrocrystal-monohydrate, (MACROBID) 100 MG capsule Take 1 capsule (100 mg total)  by mouth 2 (two) times daily.  14 capsule  0    Exam:  BP 111/67  Pulse 123  Temp(Src) 101.1 F (38.4 C) (Oral)  Resp 18  SpO2 97% Gen: Well NAD HEENT: EOMI,  MMM normal posterior pharynx and tympanic membranes Lungs: Normal work of breathing. CTABL Heart: RRR no MRG Abd: NABS, Soft. NT, ND Exts: Brisk capillary refill, warm and well perfused.  Skin: No rash  Results for orders placed during the hospital encounter of 09/01/13 (from the past 24 hour(s))  POCT URINALYSIS DIP (DEVICE)     Status: Abnormal   Collection Time    09/01/13  1:48 PM      Result Value Ref Range   Glucose, UA NEGATIVE  NEGATIVE mg/dL   Bilirubin Urine SMALL (*) NEGATIVE   Ketones, ur TRACE (*) NEGATIVE mg/dL   Specific Gravity, Urine 1.020  1.005 - 1.030   Hgb urine dipstick TRACE (*) NEGATIVE   pH 7.0  5.0 - 8.0   Protein, ur 30 (*) NEGATIVE mg/dL   Urobilinogen, UA 2.0 (*) 0.0 - 1.0 mg/dL   Nitrite NEGATIVE  NEGATIVE   Leukocytes, UA NEGATIVE  NEGATIVE   Dg Chest 2 View  09/01/2013   CLINICAL DATA:  Fever.  EXAM: CHEST  2 VIEW  COMPARISON:  April 30, 2006.  FINDINGS: The heart size and mediastinal contours are within normal limits. Both lungs are clear. The visualized skeletal structures are unremarkable.  IMPRESSION: No acute cardiopulmonary abnormality seen.   Electronically Signed   By: Roque Lias M.D.   On: 09/01/2013 13:31    Assessment and Plan: 63 y.o. female with fever of unclear etiology. Possible URI however patient doesn't have runny nose or congestion or cough.  UTI is also unlikely given patient's lack of nitrates or leukocyte esterase on urinalysis. Urine culture is pending however. The most likely etiology is tick borne illness. Plan to treat with doxycycline. Prompt followup if not much better.  Discussed warning signs or symptoms. Please see discharge instructions. Patient expresses understanding.    Rodolph Bong, MD 09/01/13 [phone]

## 2013-09-01 NOTE — ED Notes (Signed)
Pt c/o cold sxs onset yest night Sxs include: fevers, chills, HA, mild weakness and dizziness Also c/o lower back pain onset 2 days due to working out in the yard Had aspirin this am around 1100 She is alert w/no signs of acute distress.

## 2013-09-02 LAB — URINE CULTURE: Special Requests: NORMAL

## 2013-10-23 ENCOUNTER — Encounter: Payer: Self-pay | Admitting: Internal Medicine

## 2013-10-23 DIAGNOSIS — Z79899 Other long term (current) drug therapy: Secondary | ICD-10-CM

## 2013-10-23 NOTE — Assessment & Plan Note (Signed)
Will dsic next visit

## 2013-10-23 NOTE — Progress Notes (Signed)
TOX screen done 3 13 15  negative for citalopram and ativan  Positive for Remus Lofflerambien  Will discuss at next visit

## 2013-11-09 ENCOUNTER — Encounter: Payer: Self-pay | Admitting: Internal Medicine

## 2014-02-19 ENCOUNTER — Other Ambulatory Visit (INDEPENDENT_AMBULATORY_CARE_PROVIDER_SITE_OTHER): Payer: 59

## 2014-02-19 DIAGNOSIS — Z0000 Encounter for general adult medical examination without abnormal findings: Secondary | ICD-10-CM

## 2014-02-19 LAB — BASIC METABOLIC PANEL
BUN: 14 mg/dL (ref 6–23)
CO2: 30 meq/L (ref 19–32)
Calcium: 9 mg/dL (ref 8.4–10.5)
Chloride: 102 mEq/L (ref 96–112)
Creatinine, Ser: 0.9 mg/dL (ref 0.4–1.2)
GFR: 67.27 mL/min (ref >60.00)
GLUCOSE: 95 mg/dL (ref 70–99)
POTASSIUM: 3.8 meq/L (ref 3.5–5.1)
SODIUM: 139 meq/L (ref 135–145)

## 2014-02-19 LAB — CBC WITH DIFFERENTIAL/PLATELET
Basophils Absolute: 0 10*3/uL (ref 0.0–0.1)
Basophils Relative: 0.4 % (ref 0.0–3.0)
EOS ABS: 0.2 10*3/uL (ref 0.0–0.7)
EOS PCT: 4 % (ref 0.0–5.0)
HEMATOCRIT: 42 % (ref 36.0–46.0)
Hemoglobin: 14.5 g/dL (ref 12.0–15.0)
LYMPHS ABS: 2.5 10*3/uL (ref 0.7–4.0)
Lymphocytes Relative: 42.8 % (ref 12.0–46.0)
MCHC: 34.5 g/dL (ref 30.0–36.0)
MCV: 89.4 fl (ref 78.0–100.0)
Monocytes Absolute: 0.4 10*3/uL (ref 0.1–1.0)
Monocytes Relative: 7.6 % (ref 3.0–12.0)
Neutro Abs: 2.6 10*3/uL (ref 1.4–7.7)
Neutrophils Relative %: 45.2 % (ref 43.0–77.0)
PLATELETS: 234 10*3/uL (ref 150.0–400.0)
RBC: 4.7 Mil/uL (ref 3.87–5.11)
RDW: 13.9 % (ref 11.5–15.5)
WBC: 5.8 10*3/uL (ref 4.0–10.5)

## 2014-02-19 LAB — LIPID PANEL
CHOL/HDL RATIO: 4
CHOLESTEROL: 186 mg/dL (ref 0–200)
HDL: 44.6 mg/dL (ref >39.00)
LDL CALC: 111 mg/dL — AB (ref 0–99)
NonHDL: 141.4
Triglycerides: 154 mg/dL — ABNORMAL HIGH (ref 0.0–149.0)
VLDL: 30.8 mg/dL (ref 0.0–40.0)

## 2014-02-19 LAB — HEPATIC FUNCTION PANEL
ALT: 19 U/L (ref 0–35)
AST: 19 U/L (ref 0–37)
Albumin: 3.7 g/dL (ref 3.5–5.2)
Alkaline Phosphatase: 52 U/L (ref 39–117)
BILIRUBIN DIRECT: 0.1 mg/dL (ref 0.0–0.3)
BILIRUBIN TOTAL: 0.9 mg/dL (ref 0.2–1.2)
Total Protein: 6.5 g/dL (ref 6.0–8.3)

## 2014-02-19 LAB — TSH: TSH: 1.6 u[IU]/mL (ref 0.35–4.50)

## 2014-02-20 ENCOUNTER — Other Ambulatory Visit: Payer: Self-pay | Admitting: Internal Medicine

## 2014-02-20 NOTE — Telephone Encounter (Signed)
Filled for 90 days.  Pt has upcoming CPE on 02/25/14

## 2014-02-21 ENCOUNTER — Other Ambulatory Visit: Payer: Self-pay | Admitting: Internal Medicine

## 2014-02-21 DIAGNOSIS — Z1231 Encounter for screening mammogram for malignant neoplasm of breast: Secondary | ICD-10-CM

## 2014-02-22 ENCOUNTER — Ambulatory Visit (HOSPITAL_COMMUNITY)
Admission: RE | Admit: 2014-02-22 | Discharge: 2014-02-22 | Disposition: A | Discharge status: Home / Self Care | Payer: 59 | Source: Ambulatory Visit | Attending: Internal Medicine | Admitting: Internal Medicine

## 2014-02-22 DIAGNOSIS — Z1231 Encounter for screening mammogram for malignant neoplasm of breast: Secondary | ICD-10-CM | POA: Insufficient documentation

## 2014-02-25 ENCOUNTER — Ambulatory Visit (INDEPENDENT_AMBULATORY_CARE_PROVIDER_SITE_OTHER): Payer: 59 | Admitting: Internal Medicine

## 2014-02-25 ENCOUNTER — Other Ambulatory Visit (HOSPITAL_COMMUNITY)
Admission: RE | Admit: 2014-02-25 | Discharge: 2014-02-25 | Disposition: A | Discharge status: Home / Self Care | Payer: 59 | Source: Ambulatory Visit | Attending: Internal Medicine | Admitting: Internal Medicine

## 2014-02-25 VITALS — BP 132/80 | Temp 98.8°F | Ht 64.0 in | Wt 223.0 lb

## 2014-02-25 DIAGNOSIS — Z79899 Other long term (current) drug therapy: Secondary | ICD-10-CM

## 2014-02-25 DIAGNOSIS — G4700 Insomnia, unspecified: Secondary | ICD-10-CM

## 2014-02-25 DIAGNOSIS — Z23 Encounter for immunization: Secondary | ICD-10-CM

## 2014-02-25 DIAGNOSIS — F4321 Adjustment disorder with depressed mood: Secondary | ICD-10-CM

## 2014-02-25 DIAGNOSIS — Z01419 Encounter for gynecological examination (general) (routine) without abnormal findings: Secondary | ICD-10-CM | POA: Diagnosis present

## 2014-02-25 DIAGNOSIS — Z1151 Encounter for screening for human papillomavirus (HPV): Secondary | ICD-10-CM | POA: Diagnosis present

## 2014-02-25 DIAGNOSIS — G4733 Obstructive sleep apnea (adult) (pediatric): Secondary | ICD-10-CM

## 2014-02-25 DIAGNOSIS — I10 Essential (primary) hypertension: Secondary | ICD-10-CM

## 2014-02-25 DIAGNOSIS — Z0000 Encounter for general adult medical examination without abnormal findings: Secondary | ICD-10-CM

## 2014-02-25 DIAGNOSIS — E785 Hyperlipidemia, unspecified: Secondary | ICD-10-CM

## 2014-02-25 MED ORDER — ZOLPIDEM TARTRATE 10 MG PO TABS: ORAL_TABLET | ORAL | Status: DC

## 2014-02-25 MED ORDER — HYDROCHLOROTHIAZIDE 25 MG PO TABS: ORAL_TABLET | ORAL | Status: DC

## 2014-02-25 NOTE — Patient Instructions (Signed)
  Last visit in record is from dr SOOD in 2009  . He noted sever OSA  i want  You to return to discuss  Issues with sleep doctors  This is a risk to your health .  consider other options  For OSA.      ? Other options .  Because of the side effects of congestion with the current situation.  Will refill 90 days of ambien for now  . Has risk also.   Exercise somewhat may help  Somewhat .  Healthy lifestyle includes : At least 150 minutes of exercise weeks  , weight at healthy levels, which is usually   BMI 19-25. Avoid trans fats and processed foods;  Increase fresh fruits and veges to 5 servings per day. And avoid sweet beverages including tea and juice. Mediterranean diet with olive oil and nuts have been noted to be heart and brain healthy . Avoid tobacco products . Limit  alcohol to  7 per week for women and 14 servings for men.  Get adequate sleep . Wear seat belts . Don't text and drive .

## 2014-02-25 NOTE — Progress Notes (Signed)
Pre visit review using our clinic review tool, if applicable. No additional management support is needed unless otherwise documented below in the visit note.  Chief Complaint  Patient presents with  . Annual Exam    sleep med  . Hypertension    HPI: Patient comes in today for Preventive Health Care visit  No major changes in health  HT seems controled  SLEEP says needs meds to sleep   Eye itching  For a week otc not helping so far  osa   When uses  Gets a sinus  Issue with . So not using any intervention  Mood:  Not on celexa denies depression gets some anxiety at night , Health Maintenance  Topic Date Due  . Influenza Vaccine  01/13/2015  . Mammogram  02/23/2016  . Pap Smear  02/25/2017  . Tetanus/tdap  06/09/2020  . Colonoscopy  02/23/2021  . Zostavax  Completed   Health Maintenance Review LIFESTYLE:  Exercise:  Not really yard work  Tobacco/ETS:  No  Alcohol: ocass Sugar beverages: soft drinks  Pepsi q d  Sleep:  Still Remus Loffler  Tried to come off and hard to do this . Drug use: no Bone density:  Colonoscopy: 2012? PAP DUE  ROS:  GEN/ HEENT: No fever, significant weight changes sweats headaches vision problems hearing changes, CV/ PULM; No chest pain shortness of breath cough, syncope,edema  change in exercise tolerance. GI /GU: No adominal pain, vomiting, change in bowel habits. No blood in the stool. No significant GU symptoms. SKIN/HEME: ,no acute skin rashes suspicious lesions or bleeding. No lymphadenopathy, nodules, masses.  NEURO/ PSYCH:  No neurologic signs such as weakness numbness. No depression . IMM/ Allergy: No unusual infections.  Allergy .   REST of 12 system review negative except as per HPI   Past Medical History  Diagnosis Date  . OSA (obstructive sleep apnea)     moderatley severe  . Chest pain     atypical  . Hypertension   . GERD (gastroesophageal reflux disease)   . Allergy   . Nasal septal deviation   . Odontogenic cyst     of left  maxillary sinus  . History of pneumonia   . CHEST DISCOMFORT 08/30/2007    Qualifier: Diagnosis of  By: Fabian Sharp MD, Neta Mends   . OBSTRUCTIVE SLEEP APNEA 08/30/2007    Qualifier: Diagnosis of  By: Fabian Sharp MD, Neta Mends     Family History  Problem Relation Age of Onset  . Heart disease Mother   . Stroke Father   . Heart failure Father   . Emphysema Father   . Snoring Brother   . Lung cancer Brother   . Rheum arthritis Daughter     History   Social History  . Marital Status: Married    Spouse Name: N/A    Number of Children: N/A  . Years of Education: N/A   Social History Main Topics  . Smoking status: Former Smoker    Types: Cigarettes  . Smokeless tobacco: Never Used  . Alcohol Use: Yes  . Drug Use: No  . Sexual Activity: Yes    Birth Control/ Protection: None   Other Topics Concern  . 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 hydrochlorothiazide (HYDRODIURIL) 25 MG tablet TAKE 1 TABLET BY MOUTH DAILY  . LORazepam (ATIVAN) 0.5 MG tablet TAKE 1-2 TABLETS BY MOUTH BEFORE FLIGHT AS NEEDED AND AS DIRECTED  . zolpidem (AMBIEN) 10 MG tablet TAKE 1 TABLET BY MOUTH ONCE DAILY AT BEDTIME AS NEEDED SLEEP  . [DISCONTINUED] CELEBREX 200 MG capsule TAKE 1 CAPSULE BY MOUTH DAILY  . [DISCONTINUED] hydrochlorothiazide (HYDRODIURIL) 25 MG tablet TAKE 1 TABLET BY MOUTH DAILY  . [DISCONTINUED] zolpidem (AMBIEN) 10 MG tablet TAKE 1 TABLET BY MOUTH ONCE DAILY AT BEDTIME AS NEEDED SLEEP  . [DISCONTINUED] citalopram (CELEXA) 20 MG tablet Take 20 mg by mouth daily. 1 daily as directed prn  . [DISCONTINUED] doxycycline (VIBRAMYCIN) 100 MG capsule Take 1 capsule (100 mg total) by  mouth 2 (two) times daily.  . [DISCONTINUED] hydrochlorothiazide (HYDRODIURIL) 25 MG tablet Take 1 tablet (25 mg total) by mouth daily.  . [DISCONTINUED] nitrofurantoin, macrocrystal-monohydrate, (MACROBID) 100 MG capsule Take 1 capsule (100 mg total) by mouth 2 (two) times daily.    EXAM:  BP 132/80  Temp(Src) 98.8 F (37.1 C) (Oral)  Ht  (1.626 m)  Wt 223 lb (101.152 kg)  BMI 38.26 kg/m2  Body mass index is 38.26 kg/(m^2).  Physical Exam: Vital signs reviewed [id number] is a well-developed well-nourished alert lloks a bit tired  cooperative    who appearsr stated age in no acute distress.  HEENT: normocephalic atraumatic , Eyes: PERRL EOM's full, conjunctiva clear, Nares: paten,t no deformity discharge or tenderness., Ears: no deformity EAC's clear TMs with normal landmarks. Mouth: clear OP, no lesions, edema.  Moist mucous membranes. Dentition in adequate repair. Low palate  NECK: supple without masses, thyromegaly or bruits. CHEST/PULM:  Clear to auscultation and percussion breath sounds equal no wheeze , rales or rhonchi. No chest wall deformities or tenderness. Breast: normal by inspection . No dimpling, discharge, masses, tenderness or discharge . CV: PMI is nondisplaced, S1 S2 no gallops, murmurs, rubs. Peripheral pulses are full without delay.No JVD .  Breast: normal by inspection . No dimpling, discharge, masses, tenderness or discharge . ABDOMEN: Bowel sounds normal nontender  No guard or rebound, no hepato splenomegal no CVA tenderness.  No hernia. Extremtities:  No clubbing cyanosis or edema, no acute joint swelling or redness no focal atrophy NEURO:  Oriented x3, cranial nerves 3-12 appear to be intact, no obvious focal weakness,gait within normal limits no abnormal reflexes or asymmetrical SKIN: No acute rashes normal turgor, color, no bruising or petechiae. PSYCH: Oriented, good eye contact, no obvious depression anxiety, cognition and judgment appear normal. LN: no  cervical axillary inguinal adenopathy Pelvic: NL ext GU, labia clear without lesions or rash . Vagina no lesions .Cervix: clear  UTERUS: Neg CMT Adnexa:  clear no masses . PAP done with hpv rectal no masses stool heme negative    Lab Results  Component Value Date   WBC 5.8 02/19/2014   HGB 14.5 02/19/2014   HCT 42.0 02/19/2014   PLT 234.0 02/19/2014   GLUCOSE 95 02/19/2014   CHOL 186 02/19/2014   TRIG 154.0* 02/19/2014   HDL 44.60 02/19/2014   LDLDIRECT 121.8 02/26/2011   LDLCALC 111* 02/19/2014   ALT 19 02/19/2014   AST 19 02/19/2014   NA  139 02/19/2014   K 3.8 02/19/2014   CL 102 02/19/2014   CREATININE 0.9 02/19/2014   BUN 14 02/19/2014   CO2 30 02/19/2014   TSH 1.60 02/19/2014    ASSESSMENT AND PLAN:  Discussed the following assessment and plan:  Routine general medical examination at a health care facility - Plan: PAP [[place]]  Unspecified essential hypertension - controlled had renal dulplex and no aaa with fam hx   Medication management  Insomnia, unspecified - Plan: Ambulatory referral to Pulmonology  Other and unspecified hyperlipidemia  Need for prophylactic vaccination and inoculation against influenza - Plan: Flu Vaccine QUAD 36+ mos PF IM (Fluarix Quad PF)  OBSTRUCTIVE SLEEP APNEA - reveiwed record reported as sever pt says away of risk  never fu since 2009 viist willing to re adress with encouragement  - Plan: Ambulatory referral to Pulmonology  Adjustment disorder with depressed mood Doesn't make sense to just give ambien and have osa go untreated or addressed  At this time  Use nasal steroids antihistamine otc eye drops for allergy sx if needed  Patient Care Team: Madelin Headings, MD as PCP - General Cassell Clement, MD (Cardiology) Patient Instructions   Last visit in record is from dr SOOD in 2009  . He noted sever OSA  i want  You to return to discuss  Issues with sleep doctors  This is a risk to your health .  consider other options  For OSA.      ? Other options .   Because of the side effects of congestion with the current situation.  Will refill 90 days of ambien for now  . Has risk also.   Exercise somewhat may help  Somewhat .  Healthy lifestyle includes : At least 150 minutes of exercise weeks  , weight at healthy levels, which is usually   BMI 19-25. Avoid trans fats and processed foods;  Increase fresh fruits and veges to 5 servings per day. And avoid sweet beverages including tea and juice. Mediterranean diet with olive oil and nuts have been noted to be heart and brain healthy . Avoid tobacco products . Limit  alcohol to  7 per week for women and 14 servings for men.  Get adequate sleep . Wear seat belts . Don't text and drive .       Neta Mends. Panosh M.D.

## 2014-02-26 LAB — CYTOLOGY - PAP

## 2014-02-26 NOTE — Progress Notes (Signed)
Quick Note:  Tell patient PAP is normal. HPV high risk is negative ______ 

## 2014-02-27 ENCOUNTER — Encounter: Payer: Self-pay | Admitting: Family Medicine

## 2014-02-28 ENCOUNTER — Encounter: Payer: Self-pay | Admitting: Internal Medicine

## 2014-02-28 NOTE — Assessment & Plan Note (Signed)
Need to have osa reassessed and rx  Doesn't make sense to use ambien and not treat the osa adequately

## 2014-02-28 NOTE — Assessment & Plan Note (Signed)
Off med at this time per patient and says doing ok

## 2014-03-07 ENCOUNTER — Other Ambulatory Visit: Payer: Self-pay | Admitting: Internal Medicine

## 2014-03-07 MED ORDER — LORAZEPAM 0.5 MG PO TABS: ORAL_TABLET | ORAL | Status: DC

## 2014-03-07 NOTE — Telephone Encounter (Signed)
Ok per Dr. Selena Batten to send in Lorazepam 0.5 mg #6 with 0rf.  Rx called in to pharmacy.

## 2014-03-17 ENCOUNTER — Other Ambulatory Visit: Payer: Self-pay | Admitting: Internal Medicine

## 2014-03-18 NOTE — Telephone Encounter (Signed)
rx given 90 days on  9 /14 shoulnt need refill at this time Please clarigy.

## 2014-03-19 NOTE — Telephone Encounter (Signed)
Spoke to the pharmacy who stated pt picked up rx on 03/18/14.  Request denied.

## 2014-04-08 ENCOUNTER — Encounter: Payer: Self-pay | Admitting: Pulmonary Disease

## 2014-04-08 ENCOUNTER — Ambulatory Visit (INDEPENDENT_AMBULATORY_CARE_PROVIDER_SITE_OTHER): Payer: 59 | Admitting: Pulmonary Disease

## 2014-04-08 VITALS — BP 122/76 | HR 105 | Temp 98.6°F | Ht 64.0 in | Wt 227.4 lb

## 2014-04-08 DIAGNOSIS — G4733 Obstructive sleep apnea (adult) (pediatric): Secondary | ICD-10-CM

## 2014-04-08 NOTE — Progress Notes (Signed)
Subjective:    Patient ID: Rachel Blair, female    DOB: 03-06-1951, 63 y.o.   MRN: [id number]  HPI The patient is a 63 year old female who I have been asked to see for management of tract of sleep apnea. She has a history of moderate to severe OSA according to the records, but her actual sleep study is not available in the computer system. She was first diagnosed as far back as 2003, and was treated with C Pap until 2009. She discontinued the device because of issues with claustrophobia, as well as having recurrent sinus infections. She did use heated humidification, and kept up with her cleaning protocol. Currently, the patient has been noted to have snoring, but there is no observers to comment on apnea during the night. She denies having gasping arousals. She denies frequent awakenings, and feels that she is fairly rested in the mornings upon arising.  She admits to having some intermittent inappropriate daytime sleepiness with activities, and takes an afternoon nap every day. She denies any sleepiness in the evenings with television or movies, and has no sleepiness with driving. The patient states that her weight is neutral from her original diagnosis, and her Epworth score today is only 8.   Sleep Questionnaire What time do you typically go to bed?( Between what hours) 11:30 PM 11:30 PM at 1521 on 04/08/14 by Tommie Sams, CMA How long does it take you to fall asleep? 15-30 min 15-30 min at 1521 on 04/08/14 by Tommie Sams, CMA How many times during the night do you wake up? 1 1 at 1521 on 04/08/14 by Tommie Sams, CMA What time do you get out of bed to start your day? 0830 0830 at 1521 on 04/08/14 by Tommie Sams, CMA Do you drive or operate heavy machinery in your occupation? No No at 1521 on 04/08/14 by Tommie Sams, CMA How much has your weight changed (up or down) over the past two years? (In pounds) 15 lb (6.804 kg) 15 lb (6.804 kg) at 1521 on 04/08/14 by Tommie Sams,  CMA Have you ever had a sleep study before? Yes Yes at 1521 on 04/08/14 by Tommie Sams, CMA If yes, location of study? [facility] at 1521 on 04/08/14 by Tommie Sams, CMA If yes, date of study? 2003 2003 at 1521 on 04/08/14 by Tommie Sams, CMA Do you currently use CPAP? No No at 1521 on 04/08/14 by Tommie Sams, CMA Do you wear oxygen at any time? No No at 1521 on 04/08/14 by Tommie Sams, CMA   Review of Systems  Constitutional: Negative for fever and unexpected weight change.  HENT: Negative for congestion, dental problem, ear pain, nosebleeds, postnasal drip, rhinorrhea, sinus pressure, sneezing, sore throat and trouble swallowing.   Eyes: Negative for redness and itching.  Respiratory: Negative for cough, chest tightness, shortness of breath and wheezing.   Cardiovascular: Negative for palpitations and leg swelling.  Gastrointestinal: Negative for nausea and vomiting.  Genitourinary: Negative for dysuria.  Musculoskeletal: Negative for joint swelling.  Skin: Negative for rash.  Neurological: Negative for headaches.  Hematological: Does not bruise/bleed easily.  Psychiatric/Behavioral: Negative for dysphoric mood. The patient is not nervous/anxious.        Objective:   Physical Exam Constitutional:  Obese female, no acute distress  HENT:  Nares patent without discharge, but narrowed bilat  Oropharynx without exudate, palate and uvula are mildly elongate.  Eyes:  Perrla, eomi, no  scleral icterus  Neck:  No JVD, no TMG  Cardiovascular:  Normal rate, regular rhythm, no rubs or gallops.  No murmurs        Intact distal pulses  Pulmonary :  Normal breath sounds, no stridor or respiratory distress   No rales, rhonchi, or wheezing  Abdominal:  Soft, nondistended, bowel sounds present.  No tenderness noted.   Musculoskeletal:  mild lower extremity edema noted.  Lymph Nodes:  No cervical lymphadenopathy noted  Skin:  No cyanosis noted  Neurologic:  Alert,  appropriate, moves all 4 extremities without obvious deficit.         Assessment & Plan:

## 2014-04-08 NOTE — Assessment & Plan Note (Signed)
The patient has a history of severe obstructive sleep apnea, but has not worn her device in years because of issues with tolerance. She really is unsure if she could ever tolerate C Pap consistently, and we did discuss other options such as a dental appliance if her sleep apnea is only of moderate severity. I also stressed to her the importance of treatment for severe sleep apnea, especially given its impact to her long-term cardiovascular health. At this point, she needs to have a repeat sleep study, and I think she is an excellent candidate for home sleep testing. The patient is agreeable to this approach.

## 2014-04-08 NOTE — Patient Instructions (Signed)
Will schedule for home sleep testing, and will arrange followup once the results are available.   

## 2014-04-15 ENCOUNTER — Encounter: Payer: Self-pay | Admitting: Pulmonary Disease

## 2014-05-20 DIAGNOSIS — G4730 Sleep apnea, unspecified: Secondary | ICD-10-CM

## 2014-05-22 ENCOUNTER — Encounter: Payer: Self-pay | Admitting: Pulmonary Disease

## 2014-05-22 ENCOUNTER — Telehealth: Payer: Self-pay | Admitting: Pulmonary Disease

## 2014-05-22 DIAGNOSIS — G4730 Sleep apnea, unspecified: Secondary | ICD-10-CM

## 2014-05-22 NOTE — Telephone Encounter (Signed)
Pt needs ov to review her sleep study.  Thanks.  

## 2014-05-23 NOTE — Telephone Encounter (Signed)
Pt return call.Rachel Blair ° °

## 2014-05-23 NOTE — Telephone Encounter (Signed)
lmomtcb x1 

## 2014-05-23 NOTE — Telephone Encounter (Signed)
Pt scheduled tomorrow at 11

## 2014-05-24 ENCOUNTER — Ambulatory Visit: Payer: 59 | Admitting: Pulmonary Disease

## 2014-05-28 ENCOUNTER — Ambulatory Visit (INDEPENDENT_AMBULATORY_CARE_PROVIDER_SITE_OTHER): Payer: 59 | Admitting: Pulmonary Disease

## 2014-05-28 ENCOUNTER — Encounter: Payer: Self-pay | Admitting: Pulmonary Disease

## 2014-05-28 DIAGNOSIS — G4737 Central sleep apnea in conditions classified elsewhere: Secondary | ICD-10-CM

## 2014-05-28 DIAGNOSIS — G4731 Primary central sleep apnea: Secondary | ICD-10-CM

## 2014-05-28 DIAGNOSIS — G4733 Obstructive sleep apnea (adult) (pediatric): Secondary | ICD-10-CM

## 2014-05-28 NOTE — Progress Notes (Signed)
Subjective:    Patient ID: Rachel Blair, female    DOB: [birth year], 63 y.o.   MRN: [id number]  HPI The patient comes in today for follow-up of her recent home sleep test. 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 Her AHI was 68 events per hour, and she had significant oxygen desaturation. I have reviewed the study with her in detail, and answered all of her questions.   Review of Systems  Constitutional: Negative for fever and unexpected weight change.  HENT: Negative for congestion, dental problem, ear pain, nosebleeds, postnasal drip, rhinorrhea, sinus pressure, sneezing, sore throat and trouble swallowing.   Eyes: Negative for redness and itching.  Respiratory: Negative for cough, chest tightness, shortness of breath and wheezing.   Cardiovascular: Negative for palpitations and leg swelling.  Gastrointestinal: Negative for nausea and vomiting.  Genitourinary: Negative for dysuria.  Musculoskeletal: Negative for joint swelling.  Skin: Negative for rash.  Neurological: Negative for headaches.  Hematological: Does not bruise/bleed easily.  Psychiatric/Behavioral: Negative for dysphoric mood. The patient is not nervous/anxious.        Objective:   Physical Exam Overweight female in no acute distress Nose without purulence or discharge noted Neck without lymphadenopathy or thyromegaly Lower extremities with no significant edema, no cyanosis Alert and oriented, moves all 4 extremities.       Assessment & Plan:

## 2014-05-28 NOTE — Patient Instructions (Signed)
Will send to sleep center to start on ASV device, and find your optimal pressure. Work on Raytheonweight loss Will call once the results are availabe.

## 2014-05-28 NOTE — Assessment & Plan Note (Signed)
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 This may be the reason why she tolerated C Pap so poorly in the past, since C Pap can sometimes worsen central apnea.  I have stressed to her the importance of treating severe sleep apnea, and she would benefit the most from an ASV device. This would require a formal titration study at the sleep Center, and would also give her the opportunity to see how she does with the new device. The patient is willing to give this a try.

## 2014-06-29 ENCOUNTER — Other Ambulatory Visit: Payer: Self-pay | Admitting: Internal Medicine

## 2014-07-02 NOTE — Telephone Encounter (Signed)
Refill med x 1  Asking dr Shelle Ironlance about risk benefit of this medicine with her sleep problem.

## 2014-07-03 NOTE — Telephone Encounter (Signed)
Called to the pharmacy and left on machine. 

## 2014-07-18 ENCOUNTER — Ambulatory Visit (HOSPITAL_BASED_OUTPATIENT_CLINIC_OR_DEPARTMENT_OTHER): Payer: 59 | Attending: Pulmonary Disease | Admitting: Radiology

## 2014-07-18 VITALS — Ht 64.0 in | Wt 218.0 lb

## 2014-07-18 DIAGNOSIS — G4733 Obstructive sleep apnea (adult) (pediatric): Secondary | ICD-10-CM | POA: Diagnosis not present

## 2014-07-18 DIAGNOSIS — G4737 Central sleep apnea in conditions classified elsewhere: Secondary | ICD-10-CM | POA: Insufficient documentation

## 2014-07-18 DIAGNOSIS — G4731 Primary central sleep apnea: Secondary | ICD-10-CM

## 2014-07-30 DIAGNOSIS — G4733 Obstructive sleep apnea (adult) (pediatric): Secondary | ICD-10-CM

## 2014-07-30 NOTE — Sleep Study (Signed)
NAME: Rachel Blair DATE OF BIRTH:  Apr 11, 1951 MEDICAL RECORD NUMBER [id number]  LOCATION: [facility]  PHYSICIAN: Barbaraann Share  DATE OF STUDY: 07/18/2014  SLEEP STUDY TYPE: Nocturnal Polysomnogram               REFERRING PHYSICIAN: Barbaraann Share, MD  INDICATION FOR STUDY: Hypersomnia with complex sleep apnea. The patient has a history of poor C Pap tolerance, and is here for ASV titration.  EPWORTH SLEEPINESS SCORE:  3 HEIGHT: 5\' 4"  (162.6 cm)  WEIGHT: 218 lb (98.884 kg)    Body mass index is 37.4 kg/(m^2).  NECK SIZE: 16.5 in.  MEDICATIONS: Reviewed in the sleep record.  SLEEP ARCHITECTURE: The patient had a total sleep time of 288 minutes, with no slow-wave sleep and adequate quantity of REM. Sleep onset latency was normal, as was REM onset. Sleep efficiency was significantly reduced at 68%.  RESPIRATORY DATA: The patient underwent a titration with an ASV device, using a medium Respironics Amara view full face mask. She was found to have excellent control of her obstructive and central events with a pressure support of 3-15 centimeters, and an EPAP of 7 cm.  OXYGEN DATA: The patient had transient desaturation as low as 87% with her events  CARDIAC DATA: Rare PAC noted  MOVEMENT/PARASOMNIA: No significant periodic limb movements or other abnormal behaviors were seen.  IMPRESSION/ RECOMMENDATION:    1) good control of previously documented complex sleep apnea with an ASV device set at a pressure support of 3-15 and an EPAP of 7 cm. A medium Respironics Amara view full face mask was used for pressure delivery. The patient should also be encouraged to work aggressively on weight loss.  2) rare PAC noted, but no clinically significant arrhythmias were seen.    Barbaraann Share Diplomate, American Board of Sleep Medicine  ELECTRONICALLY SIGNED ON:  07/30/2014, 5:22 PM [facility] PH: [phone]   FX: [phone] ACCREDITED  BY THE AMERICAN ACADEMY OF SLEEP MEDICINE

## 2014-07-31 ENCOUNTER — Other Ambulatory Visit: Payer: Self-pay | Admitting: Pulmonary Disease

## 2014-07-31 DIAGNOSIS — G4731 Primary central sleep apnea: Secondary | ICD-10-CM

## 2014-08-02 ENCOUNTER — Telehealth: Payer: Self-pay | Admitting: Pulmonary Disease

## 2014-08-02 NOTE — Telephone Encounter (Signed)
Spoke with Melissa with [facility], states that the ASV is requiring a PA, which will take 7-10 business days to be completed.  [facility].

## 2014-08-02 NOTE — Telephone Encounter (Signed)
lmtcb for Melissa.  

## 2014-08-02 NOTE — Telephone Encounter (Signed)
Rachel cb °

## 2014-08-07 ENCOUNTER — Encounter (HOSPITAL_BASED_OUTPATIENT_CLINIC_OR_DEPARTMENT_OTHER): Payer: 59

## 2014-09-20 ENCOUNTER — Encounter: Payer: Self-pay | Admitting: Pulmonary Disease

## 2014-09-20 ENCOUNTER — Ambulatory Visit (INDEPENDENT_AMBULATORY_CARE_PROVIDER_SITE_OTHER): Payer: 59 | Admitting: Pulmonary Disease

## 2014-09-20 VITALS — BP 116/70 | HR 74 | Temp 97.0°F | Ht 64.0 in | Wt 234.6 lb

## 2014-09-20 DIAGNOSIS — G4733 Obstructive sleep apnea (adult) (pediatric): Secondary | ICD-10-CM | POA: Diagnosis not present

## 2014-09-20 DIAGNOSIS — G4737 Central sleep apnea in conditions classified elsewhere: Secondary | ICD-10-CM

## 2014-09-20 DIAGNOSIS — G4731 Primary central sleep apnea: Secondary | ICD-10-CM

## 2014-09-20 NOTE — Progress Notes (Signed)
Subjective:    Patient ID: Rachel Blair, female    DOB: November 15, 1950, 64 y.o.   MRN: [id number]  HPI The patient comes in today for follow-up of her known complex apnea. She has been started on an ASV device, and has seen significant improvement in her sleep and daytime alertness. She initially had issues with mask leak and elevated AHI, but now she is on nasal pillows and doing very well. Her AHI is in the 2-3 range on her download once the mask change was done. Her compliance is excellent, and is wearing for over 8 hours a night.   Review of Systems  Constitutional: Negative for fever and unexpected weight change.  HENT: Negative for congestion, dental problem, ear pain, nosebleeds, postnasal drip, rhinorrhea, sinus pressure, sneezing, sore throat and trouble swallowing.   Eyes: Negative for redness and itching.  Respiratory: Negative for cough, chest tightness, shortness of breath and wheezing.   Cardiovascular: Negative for palpitations and leg swelling.  Gastrointestinal: Negative for nausea and vomiting.  Genitourinary: Negative for dysuria.  Musculoskeletal: Negative for joint swelling.  Skin: Negative for rash.  Neurological: Negative for headaches.  Hematological: Does not bruise/bleed easily.  Psychiatric/Behavioral: Negative for dysphoric mood. The patient is not nervous/anxious.        Objective:   Physical Exam Overweight female in no acute distress Nose without purulence or discharge noted Neck without lymphadenopathy or thyromegaly No skin breakdown or pressure necrosis from the C Pap mask Lower extremities without edema, no cyanosis Alert and oriented, moves all 4 extremities.       Assessment & Plan:

## 2014-09-20 NOTE — Patient Instructions (Signed)
Stay on your ASV device, and keep up with mask changes and supplies. Work on weight loss followup in 6mos.

## 2014-09-20 NOTE — Assessment & Plan Note (Signed)
The patient is doing very well on her ASV device. She has seen definite improvement in her sleep and daytime alertness, and has good control of her AHI since she has switched over to nasal pillows. She has excellent compliance on her download today. I have asked her to continue on this while working on weight loss, and will see her back in 6 months.

## 2014-10-04 ENCOUNTER — Telehealth: Payer: Self-pay | Admitting: Pulmonary Disease

## 2014-10-04 NOTE — Telephone Encounter (Signed)
Called and spoke to pt. Pt requesting where she can donate her old cpap machine. Advised pt we do not typically donate cpap machines but to call her DME and they may be able to further assist her in donating the machine. Pt verbalized understanding and denied any further questions or concerns at this time.

## 2014-10-20 ENCOUNTER — Other Ambulatory Visit: Payer: Self-pay | Admitting: Internal Medicine

## 2014-10-22 NOTE — Telephone Encounter (Signed)
Over due for OV med   Can rx 14 # until paitent can get in for OV.

## 2014-10-23 NOTE — Telephone Encounter (Signed)
Called to the pharmacy and left on voicemail. 

## 2014-10-25 ENCOUNTER — Encounter: Payer: Self-pay | Admitting: Internal Medicine

## 2014-11-18 ENCOUNTER — Telehealth: Payer: Self-pay | Admitting: Internal Medicine

## 2014-11-18 MED ORDER — ZOLPIDEM TARTRATE 10 MG PO TABS: 10.0000 mg | ORAL_TABLET | Freq: Every evening | ORAL | Status: DC | PRN

## 2014-11-18 NOTE — Telephone Encounter (Signed)
Patient is scheduled for med check on 12/03/14 and is requesting re-fill on zolpidem (AMBIEN) 10 MG tablet [facility] [phone] - [place] - [address] [facility] [address]  She would like enough to hold her till the appointment.

## 2014-11-18 NOTE — Telephone Encounter (Signed)
Called to the pharmacy and left on machine.  Pt notified. 

## 2014-11-18 NOTE — Telephone Encounter (Signed)
Can rx 15 pills  No refills until get into visit  Also due for  tox screen I think

## 2014-12-03 ENCOUNTER — Encounter: Payer: Self-pay | Admitting: Internal Medicine

## 2014-12-03 ENCOUNTER — Ambulatory Visit (INDEPENDENT_AMBULATORY_CARE_PROVIDER_SITE_OTHER): Payer: 59 | Admitting: Internal Medicine

## 2014-12-03 VITALS — BP 132/78 | Temp 98.3°F | Ht 64.0 in | Wt 229.4 lb

## 2014-12-03 DIAGNOSIS — I10 Essential (primary) hypertension: Secondary | ICD-10-CM

## 2014-12-03 DIAGNOSIS — G4700 Insomnia, unspecified: Secondary | ICD-10-CM | POA: Diagnosis not present

## 2014-12-03 DIAGNOSIS — Z79899 Other long term (current) drug therapy: Secondary | ICD-10-CM | POA: Diagnosis not present

## 2014-12-03 DIAGNOSIS — G4731 Primary central sleep apnea: Secondary | ICD-10-CM

## 2014-12-03 DIAGNOSIS — I119 Hypertensive heart disease without heart failure: Secondary | ICD-10-CM | POA: Diagnosis not present

## 2014-12-03 DIAGNOSIS — G4733 Obstructive sleep apnea (adult) (pediatric): Secondary | ICD-10-CM | POA: Diagnosis not present

## 2014-12-03 DIAGNOSIS — G4737 Central sleep apnea in conditions classified elsewhere: Secondary | ICD-10-CM

## 2014-12-03 MED ORDER — ZOLPIDEM TARTRATE 10 MG PO TABS: 10.0000 mg | ORAL_TABLET | Freq: Every evening | ORAL | Status: DC | PRN

## 2014-12-03 NOTE — Patient Instructions (Signed)
Plan  Wellness visit and labs in 4 months .   Consideration of trying to transition at some time for another sleep med such as  belsoma . considier counseling   Help  For  external triggers.

## 2014-12-03 NOTE — Progress Notes (Signed)
Pre visit review using our clinic review tool, if applicable. No additional management support is needed unless otherwise documented below in the visit note.' Chief Complaint  Patient presents with  . Follow-up    HPI: Rachel Blair 64 y.o.  comes in for chronic disease/ medication management  Has now been treated for complex osa  See note dr Shelle Iron . stil taking ambien every night   Still feels better under rx  asv unit much better results  Over the last 2 months more alert . BP ok at home  Ha less naps .Senies sig depression under rx so far seems doing better  ROS: See pertinent positives and negatives per HPI.  Past Medical History  Diagnosis Date  . OSA (obstructive sleep apnea)     moderatley severe  . Chest pain     atypical  . Hypertension   . GERD (gastroesophageal reflux disease)   . Allergy   . Nasal septal deviation   . Odontogenic cyst     of left maxillary sinus  . History of pneumonia   . CHEST DISCOMFORT 08/30/2007    Qualifier: Diagnosis of  By: Fabian Sharp MD, Neta Mends   . OBSTRUCTIVE SLEEP APNEA 08/30/2007    Qualifier: Diagnosis of  By: Fabian Sharp MD, Neta Mends     Family History  Problem Relation Age of Onset  . Heart disease Mother   . Stroke Father   . Heart failure Father   . Emphysema Father   . Snoring Brother   . Lung cancer Brother   . Rheum arthritis Daughter     History   Social History  . Marital Status: Married    Spouse Name: N/A  . Number of Children: 3  . Years of Education: N/A   Occupational History  . retired    Social History Main Topics  . Smoking status: Former Smoker -- 1.00 packs/day for 30 years    Types: Cigarettes    Quit date: 06/14/1998  . Smokeless tobacco: Never Used  . Alcohol Use: Yes     Comment: once a month  . Drug Use: No  . Sexual Activity: Yes    Birth Control/ Protection: None   Other Topics Concern  . 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 hydrochlorothiazide (HYDRODIURIL) 25 MG tablet TAKE 1 TABLET BY MOUTH DAILY 90 tablet 3  . LORazepam (ATIVAN) 0.5 MG tablet TAKE 1-2 TABLETS BY MOUTH BEFORE FLIGHT AS NEEDED AND AS DIRECTED 6 tablet 0  . zolpidem (AMBIEN) 10 MG tablet Take 1 tablet (10 mg total) by mouth at bedtime as needed. for sleep 15 tablet 0   No facility-administered medications prior to visit.     EXAM:  BP 132/78 mmHg  Temp(Src) 98.3 F (36.8 C) (Oral)  Ht  (1.626 m)  Wt 229 lb 6.4 oz (104.055 kg)  BMI 39.36 kg/m2  Body mass index is 39.36 kg/(m^2).  GENERAL: vitals reviewed and listed above, alert, oriented, appears well hydrated and in no acute distress HEENT:  atraumatic, conjunctiva  clear, no obvious abnormalities on inspection of external nose and ears OP : no lesion edema or exudate  NECK: no obvious masses on inspection palpation  LUNGS: clear to auscultation bilaterally, no wheezes, rales or rhonchi, good air movement CV: HRRR, no clubbing cyanosis or  peripheral edema nl cap refill  MS: moves all extremities without noticeable focal  abnormality PSYCH: pleasant and cooperative, no obvious depression or anxiety Lab Results  Component Value Date   WBC 5.8 02/19/2014   HGB 14.5 02/19/2014   HCT 42.0 02/19/2014   PLT 234.0 02/19/2014   GLUCOSE 95 02/19/2014   CHOL 186 02/19/2014   TRIG 154.0* 02/19/2014   HDL 44.60 02/19/2014   LDLDIRECT 121.8 02/26/2011   LDLCALC 111* 02/19/2014   ALT 19 02/19/2014   AST 19 02/19/2014   NA 139 02/19/2014   K 3.8 02/19/2014   CL 102 02/19/2014   CREATININE 0.9 02/19/2014   BUN 14 02/19/2014   CO2 30 02/19/2014   TSH 1.60 02/19/2014   BP Readings from Last 3 Encounters:  12/03/14 132/78    09/20/14 116/70  05/28/14 134/72   Wt Readings from Last 3 Encounters:  12/03/14 229 lb 6.4 oz (104.055 kg)  09/20/14 234 lb 9.6 oz (106.414 kg)  07/18/14 218 lb (98.884 kg)     ASSESSMENT AND PLAN:  Discussed the following assessment and plan:  Medication management  Benign hypertensive heart disease without heart failure  Complex sleep apnea syndrome  Essential hypertension  Insomnia Prefer other option for sleep than ambien but  Would have to withdraw from med  Perhaps help from sleep specialist in future to help with best option for her lower risk since her osa is being treated . -Patient advised to return or notify health care team  if symptoms worsen ,persist or new concerns arise. Full lab and wellness due in September  tox screen today  Total visit > 50% spent counseling and coordinating care as indicated in above note and in instructions to patient .  Patient Instructions  Plan  Wellness visit and labs in 4 months .   Consideration of trying to transition at some time for another sleep med such as  belsoma . considier counseling   Help  For  external triggers.     Neta Mends. Kaija Kovacevic M.D.

## 2014-12-10 ENCOUNTER — Telehealth: Payer: Self-pay | Admitting: Pulmonary Disease

## 2014-12-10 NOTE — Telephone Encounter (Signed)
Melissa sent a staff message stating she will pull the notes. Nothing further needed

## 2014-12-10 NOTE — Telephone Encounter (Signed)
I have sent Rachel Blair a staff message to get notes from epic.  Pt is aware we will get this taken care of for her. Will await message from Verm[place]melissa.

## 2014-12-13 ENCOUNTER — Encounter: Payer: Self-pay | Admitting: Internal Medicine

## 2014-12-23 ENCOUNTER — Encounter: Payer: Self-pay | Admitting: Internal Medicine

## 2015-01-15 ENCOUNTER — Telehealth: Payer: Self-pay | Admitting: Internal Medicine

## 2015-01-15 NOTE — Telephone Encounter (Signed)
Ok to refill x 1 #6

## 2015-01-15 NOTE — Telephone Encounter (Signed)
Pt said she is going on a trip and is asking for rx for  [facility]   Pharmacy  [facility]

## 2015-01-16 MED ORDER — LORAZEPAM 0.5 MG PO TABS: ORAL_TABLET | ORAL | Status: DC

## 2015-01-16 NOTE — Telephone Encounter (Signed)
Called to the pharmacy and left on machine.  Pt notified by telephone.

## 2015-03-12 ENCOUNTER — Other Ambulatory Visit: Payer: Self-pay | Admitting: Internal Medicine

## 2015-03-31 ENCOUNTER — Other Ambulatory Visit (INDEPENDENT_AMBULATORY_CARE_PROVIDER_SITE_OTHER): Payer: 59

## 2015-03-31 DIAGNOSIS — Z0000 Encounter for general adult medical examination without abnormal findings: Secondary | ICD-10-CM

## 2015-03-31 LAB — HEPATIC FUNCTION PANEL
ALT: 22 U/L (ref 0–35)
AST: 15 U/L (ref 0–37)
Albumin: 3.9 g/dL (ref 3.5–5.2)
Alkaline Phosphatase: 71 U/L (ref 39–117)
Bilirubin, Direct: 0.1 mg/dL (ref 0.0–0.3)
Total Bilirubin: 0.5 mg/dL (ref 0.2–1.2)
Total Protein: 5.9 g/dL — ABNORMAL LOW (ref 6.0–8.3)

## 2015-03-31 LAB — CBC WITH DIFFERENTIAL/PLATELET
Basophils Absolute: 0 10*3/uL (ref 0.0–0.1)
Basophils Relative: 0.5 % (ref 0.0–3.0)
EOS PCT: 3.8 % (ref 0.0–5.0)
Eosinophils Absolute: 0.2 10*3/uL (ref 0.0–0.7)
HCT: 43.8 % (ref 36.0–46.0)
Hemoglobin: 14.9 g/dL (ref 12.0–15.0)
LYMPHS ABS: 2.7 10*3/uL (ref 0.7–4.0)
Lymphocytes Relative: 42.3 % (ref 12.0–46.0)
MCHC: 34 g/dL (ref 30.0–36.0)
MCV: 90.5 fl (ref 78.0–100.0)
Monocytes Absolute: 0.5 10*3/uL (ref 0.1–1.0)
Monocytes Relative: 8.1 % (ref 3.0–12.0)
Neutro Abs: 2.9 10*3/uL (ref 1.4–7.7)
Neutrophils Relative %: 45.3 % (ref 43.0–77.0)
PLATELETS: 228 10*3/uL (ref 150.0–400.0)
RBC: 4.83 Mil/uL (ref 3.87–5.11)
RDW: 13.8 % (ref 11.5–15.5)
WBC: 6.3 10*3/uL (ref 4.0–10.5)

## 2015-03-31 LAB — BASIC METABOLIC PANEL
BUN: 18 mg/dL (ref 6–23)
CHLORIDE: 108 meq/L (ref 96–112)
CO2: 26 meq/L (ref 19–32)
Calcium: 8.6 mg/dL (ref 8.4–10.5)
Creatinine, Ser: 0.74 mg/dL (ref 0.40–1.20)
GFR: 84.02 mL/min (ref >60.00)
Glucose, Bld: 115 mg/dL — ABNORMAL HIGH (ref 70–99)
POTASSIUM: 3.8 meq/L (ref 3.5–5.1)
Sodium: 144 mEq/L (ref 135–145)

## 2015-03-31 LAB — LIPID PANEL
Cholesterol: 176 mg/dL (ref 0–200)
HDL: 47.6 mg/dL (ref >39.00)
LDL Cholesterol: 96 mg/dL (ref 0–99)
NonHDL: 128.08
Total CHOL/HDL Ratio: 4
Triglycerides: 161 mg/dL — ABNORMAL HIGH (ref 0.0–149.0)
VLDL: 32.2 mg/dL (ref 0.0–40.0)

## 2015-03-31 LAB — TSH: TSH: 1.65 u[IU]/mL (ref 0.35–4.50)

## 2015-04-01 ENCOUNTER — Ambulatory Visit (INDEPENDENT_AMBULATORY_CARE_PROVIDER_SITE_OTHER): Payer: 59 | Admitting: Pulmonary Disease

## 2015-04-01 ENCOUNTER — Encounter: Payer: Self-pay | Admitting: Pulmonary Disease

## 2015-04-01 VITALS — BP 130/60 | HR 91 | Ht 64.0 in | Wt 233.4 lb

## 2015-04-01 DIAGNOSIS — G4731 Primary central sleep apnea: Secondary | ICD-10-CM

## 2015-04-01 DIAGNOSIS — G4737 Central sleep apnea in conditions classified elsewhere: Secondary | ICD-10-CM | POA: Diagnosis not present

## 2015-04-01 DIAGNOSIS — G4733 Obstructive sleep apnea (adult) (pediatric): Secondary | ICD-10-CM

## 2015-04-01 NOTE — Assessment & Plan Note (Signed)
on ASV machine Continue using at leash 6h/ night CPAP supplies will be renewed  Weight loss encouraged, compliance with goal of at least 4-6 hrs every night is the expectation. Advised against medications with sedative side effects Cautioned against driving when sleepy - understanding that sleepiness will vary on a day to day basis

## 2015-04-01 NOTE — Patient Instructions (Signed)
You are on ASV machine Continue using at leash 6h/ night CPAP supplies will be renewed

## 2015-04-01 NOTE — Progress Notes (Signed)
Subjective:    Patient ID: Rachel Blair, female    DOB: [birth year], 64 y.o.   MRN: [id number]  HPI  [facility] pt, For FU of severe OSA  Chief Complaint  Patient presents with  . Follow-up    Former [facility] pt; wearing every night but 1 night she fell asleep without putting on mask; approx 7+ hrs nightly.  no concerns.    Feels ASV so much better compared to CPAP Was having sinus issues with CPAP Uses nasal mask, pr ok, no dryness  Download 03/2015 >> on ASV - no residuals, mild leak , usage > 7h  HST 05/2014:  [facility] 68/hr ASV titration study 07/2014:  Optimal pressure >>  PS 3-15, EPAP 7  Review of Systems neg for any significant sore throat, dysphagia, itching, sneezing, nasal congestion or excess/ purulent secretions, fever, chills, sweats, unintended wt loss, pleuritic or exertional cp, hempoptysis, orthopnea pnd or change in chronic leg swelling. Also denies presyncope, palpitations, heartburn, abdominal pain, nausea, vomiting, diarrhea or change in bowel or urinary habits, dysuria,hematuria, rash, arthralgias, visual complaints, headache, numbness weakness or ataxia.     Objective:   Physical Exam  Gen. Pleasant, obese, in no distress ENT - no lesions, no post nasal drip Neck: No JVD, no thyromegaly, no carotid bruits Lungs: no use of accessory muscles, no dullness to percussion, decreased without rales or rhonchi  Cardiovascular: Rhythm regular, heart sounds  normal, no murmurs or gallops, no peripheral edema Musculoskeletal: No deformities, no cyanosis or clubbing , no tremors        Assessment & Plan:

## 2015-04-08 ENCOUNTER — Encounter: Payer: Self-pay | Admitting: Internal Medicine

## 2015-04-08 ENCOUNTER — Ambulatory Visit (INDEPENDENT_AMBULATORY_CARE_PROVIDER_SITE_OTHER): Payer: 59 | Admitting: Internal Medicine

## 2015-04-08 VITALS — BP 130/80 | Temp 98.8°F | Ht 63.5 in | Wt 229.8 lb

## 2015-04-08 DIAGNOSIS — Z79899 Other long term (current) drug therapy: Secondary | ICD-10-CM

## 2015-04-08 DIAGNOSIS — G4700 Insomnia, unspecified: Secondary | ICD-10-CM | POA: Diagnosis not present

## 2015-04-08 DIAGNOSIS — I119 Hypertensive heart disease without heart failure: Secondary | ICD-10-CM

## 2015-04-08 DIAGNOSIS — G4731 Primary central sleep apnea: Secondary | ICD-10-CM

## 2015-04-08 DIAGNOSIS — G4737 Central sleep apnea in conditions classified elsewhere: Secondary | ICD-10-CM

## 2015-04-08 DIAGNOSIS — G4733 Obstructive sleep apnea (adult) (pediatric): Secondary | ICD-10-CM

## 2015-04-08 DIAGNOSIS — R739 Hyperglycemia, unspecified: Secondary | ICD-10-CM

## 2015-04-08 DIAGNOSIS — Z23 Encounter for immunization: Secondary | ICD-10-CM

## 2015-04-08 DIAGNOSIS — Z0000 Encounter for general adult medical examination without abnormal findings: Secondary | ICD-10-CM | POA: Diagnosis not present

## 2015-04-08 MED ORDER — ZOLPIDEM TARTRATE 10 MG PO TABS: 10.0000 mg | ORAL_TABLET | Freq: Every evening | ORAL | Status: DC | PRN

## 2015-04-08 MED ORDER — SUVOREXANT 10 MG PO TABS: 1.0000 | ORAL_TABLET | Freq: Every day | ORAL | Status: DC

## 2015-04-08 NOTE — Patient Instructions (Addendum)
stop sugar beverages   As we discussed or at least 3 weeks off and then  Limit. Plan hga1c pre visit in 6 months  Your sugar levels are at risk for getting diabetes.  Continued caution with ambien  Try 5 mg instead of 10 mg .   Health Maintenance, Female Adopting a healthy lifestyle and getting preventive care can go a long way to promote health and wellness. Talk with your health care provider about what schedule of regular examinations is right for you. This is a good chance for you to check in with your provider about disease prevention and staying healthy. In between checkups, there are plenty of things you can do on your own. Experts have done a lot of research about which lifestyle changes and preventive measures are most likely to keep you healthy. Ask your health care provider for more information. WEIGHT AND DIET  Eat a healthy diet  Be sure to include plenty of vegetables, fruits, low-fat dairy products, and lean protein.  Do not eat a lot of foods high in solid fats, added sugars, or salt.  Get regular exercise. This is one of the most important things you can do for your health.  Most adults should exercise for at least 150 minutes each week. The exercise should increase your heart rate and make you sweat (moderate-intensity exercise).  Most adults should also do strengthening exercises at least twice a week. This is in addition to the moderate-intensity exercise.  Maintain a healthy weight  Body mass index (BMI) is a measurement that can be used to identify possible weight problems. It estimates body fat based on height and weight. Your health care provider can help determine your BMI and help you achieve or maintain a healthy weight.  For females 17 years of age and older:   A BMI below 18.5 is considered underweight.  A BMI of 18.5 to 24.9 is normal.  A BMI of 25 to 29.9 is considered overweight.  A BMI of 30 and above is considered obese.  Watch levels of  cholesterol and blood lipids  You should start having your blood tested for lipids and cholesterol at 64 years of age, then have this test every 5 years.  You may need to have your cholesterol levels checked more often if:  Your lipid or cholesterol levels are high.  You are older than 64 years of age.  You are at high risk for heart disease.  CANCER SCREENING   Lung Cancer  Lung cancer screening is recommended for adults 51-79 years old who are at high risk for lung cancer because of a history of smoking.  A yearly low-dose CT scan of the lungs is recommended for people who:  Currently smoke.  Have quit within the past 15 years.  Have at least a 30-pack-year history of smoking. A pack year is smoking an average of one pack of cigarettes a day for 1 year.  Yearly screening should continue until it has been 15 years since you quit.  Yearly screening should stop if you develop a health problem that would prevent you from having lung cancer treatment.  Breast Cancer  Practice breast self-awareness. This means understanding how your breasts normally appear and feel.  It also means doing regular breast self-exams. Let your health care provider know about any changes, no matter how small.  If you are in your 20s or 30s, you should have a clinical breast exam (CBE) by a health care provider every 1-3  years as part of a regular health exam.  If you are 52 or older, have a CBE every year. Also consider having a breast X-ray (mammogram) every year.  If you have a family history of breast cancer, talk to your health care provider about genetic screening.  If you are at high risk for breast cancer, talk to your health care provider about having an MRI and a mammogram every year.  Breast cancer gene (BRCA) assessment is recommended for women who have family members with BRCA-related cancers. BRCA-related cancers include:  Breast.  Ovarian.  Tubal.  Peritoneal  cancers.  Results of the assessment will determine the need for genetic counseling and BRCA1 and BRCA2 testing. Cervical Cancer Your health care provider may recommend that you be screened regularly for cancer of the pelvic organs (ovaries, uterus, and vagina). This screening involves a pelvic examination, including checking for microscopic changes to the surface of your cervix (Pap test). You may be encouraged to have this screening done every 3 years, beginning at age 84.  For women ages 52-65, health care providers may recommend pelvic exams and Pap testing every 3 years, or they may recommend the Pap and pelvic exam, combined with testing for human papilloma virus (HPV), every 5 years. Some types of HPV increase your risk of cervical cancer. Testing for HPV may also be done on women of any age with unclear Pap test results.  Other health care providers may not recommend any screening for nonpregnant women who are considered low risk for pelvic cancer and who do not have symptoms. Ask your health care provider if a screening pelvic exam is right for you.  If you have had past treatment for cervical cancer or a condition that could lead to cancer, you need Pap tests and screening for cancer for at least 20 years after your treatment. If Pap tests have been discontinued, your risk factors (such as having a new sexual partner) need to be reassessed to determine if screening should resume. Some women have medical problems that increase the chance of getting cervical cancer. In these cases, your health care provider may recommend more frequent screening and Pap tests. Colorectal Cancer  This type of cancer can be detected and often prevented.  Routine colorectal cancer screening usually begins at 64 years of age and continues through 64 years of age.  Your health care provider may recommend screening at an earlier age if you have risk factors for colon cancer.  Your health care provider may also  recommend using home test kits to check for hidden blood in the stool.  A small camera at the end of a tube can be used to examine your colon directly (sigmoidoscopy or colonoscopy). This is done to check for the earliest forms of colorectal cancer.  Routine screening usually begins at age 31.  Direct examination of the colon should be repeated every 5-10 years through 64 years of age. However, you may need to be screened more often if early forms of precancerous polyps or small growths are found. Skin Cancer  Check your skin from head to toe regularly.  Tell your health care provider about any new moles or changes in moles, especially if there is a change in a mole's shape or color.  Also tell your health care provider if you have a mole that is larger than the size of a pencil eraser.  Always use sunscreen. Apply sunscreen liberally and repeatedly throughout the day.  Protect yourself by wearing long  sleeves, pants, a wide-brimmed hat, and sunglasses whenever you are outside. HEART DISEASE, DIABETES, AND HIGH BLOOD PRESSURE   High blood pressure causes heart disease and increases the risk of stroke. High blood pressure is more likely to develop in:  People who have blood pressure in the high end of the normal range (130-139/85-89 mm Hg).  People who are overweight or obese.  People who are African American.  If you are 44-28 years of age, have your blood pressure checked every 3-5 years. If you are 44 years of age or older, have your blood pressure checked every year. You should have your blood pressure measured twice--once when you are at a hospital or clinic, and once when you are not at a hospital or clinic. Record the average of the two measurements. To check your blood pressure when you are not at a hospital or clinic, you can use:  An automated blood pressure machine at a pharmacy.  A home blood pressure monitor.  If you are between 62 years and 107 years old, ask your health  care provider if you should take aspirin to prevent strokes.  Have regular diabetes screenings. This involves taking a blood sample to check your fasting blood sugar level.  If you are at a normal weight and have a low risk for diabetes, have this test once every three years after 64 years of age.  If you are overweight and have a high risk for diabetes, consider being tested at a younger age or more often. PREVENTING INFECTION  Hepatitis B  If you have a higher risk for hepatitis B, you should be screened for this virus. You are considered at high risk for hepatitis B if:  You were born in a country where hepatitis B is common. Ask your health care provider which countries are considered high risk.  Your parents were born in a high-risk country, and you have not been immunized against hepatitis B (hepatitis B vaccine).  You have HIV or AIDS.  You use needles to inject street drugs.  You live with someone who has hepatitis B.  You have had sex with someone who has hepatitis B.  You get hemodialysis treatment.  You take certain medicines for conditions, including cancer, organ transplantation, and autoimmune conditions. Hepatitis C  Blood testing is recommended for:  Everyone born from [birth year] through 1965.  Anyone with known risk factors for hepatitis C. Sexually transmitted infections (STIs)  You should be screened for sexually transmitted infections (STIs) including gonorrhea and chlamydia if:  You are sexually active and are younger than 64 years of age.  You are older than 64 years of age and your health care provider tells you that you are at risk for this type of infection.  Your sexual activity has changed since you were last screened and you are at an increased risk for chlamydia or gonorrhea. Ask your health care provider if you are at risk.  If you do not have HIV, but are at risk, it may be recommended that you take a prescription medicine daily to prevent HIV  infection. This is called pre-exposure prophylaxis (PrEP). You are considered at risk if:  You are sexually active and do not regularly use condoms or know the HIV status of your partner(s).  You take drugs by injection.  You are sexually active with a partner who has HIV. Talk with your health care provider about whether you are at high risk of being infected with HIV. If you choose  to begin PrEP, you should first be tested for HIV. You should then be tested every 3 months for as long as you are taking PrEP.  PREGNANCY   If you are premenopausal and you may become pregnant, ask your health care provider about preconception counseling.  If you may become pregnant, take 400 to 800 micrograms (mcg) of folic acid every day.  If you want to prevent pregnancy, talk to your health care provider about birth control (contraception). OSTEOPOROSIS AND MENOPAUSE   Osteoporosis is a disease in which the bones lose minerals and strength with aging. This can result in serious bone fractures. Your risk for osteoporosis can be identified using a bone density scan.  If you are 85 years of age or older, or if you are at risk for osteoporosis and fractures, ask your health care provider if you should be screened.  Ask your health care provider whether you should take a calcium or vitamin D supplement to lower your risk for osteoporosis.  Menopause may have certain physical symptoms and risks.  Hormone replacement therapy may reduce some of these symptoms and risks. Talk to your health care provider about whether hormone replacement therapy is right for you.  HOME CARE INSTRUCTIONS   Schedule regular health, dental, and eye exams.  Stay current with your immunizations.   Do not use any tobacco products including cigarettes, chewing tobacco, or electronic cigarettes.  If you are pregnant, do not drink alcohol.  If you are breastfeeding, limit how much and how often you drink alcohol.  Limit  alcohol intake to no more than 1 drink per day for nonpregnant women. One drink equals 12 ounces of beer, 5 ounces of wine, or 1 ounces of hard liquor.  Do not use street drugs.  Do not share needles.  Ask your health care provider for help if you need support or information about quitting drugs.  Tell your health care provider if you often feel depressed.  Tell your health care provider if you have ever been abused or do not feel safe at home.   This information is not intended to replace advice given to you by your health care provider. Make sure you discuss any questions you have with your health care provider.   Document Released: 12/14/2010 Document Revised: 06/21/2014 Document Reviewed: 05/02/2013 [employer] Interactive Patient Education [employer].

## 2015-04-08 NOTE — Progress Notes (Signed)
Pre visit review using our clinic review tool, if applicable. No additional management support is needed unless otherwise documented below in the visit note.  Chief Complaint  Patient presents with  . Annual Exam    HPI: Patient  Rachel Blair  63 y.o. comes in today for Preventive Health Care visit  And med evaluatioin She is now under adequate rx for complexs sleep apnea and had more energy in the after noon no linger taking naps  Continues to atke amiben to help maintain sleep but still wakes up some amnesia if reading before bed after med no other se .  Hard to not take this .  Willing to look into other optons.  Only takes ativan for travel  Rare use  Health Maintenance  Topic Date Due  . Hepatitis C Screening  08-08-1950  . HIV Screening  04/07/2016 (Originally 05/24/1966)  . INFLUENZA VACCINE  01/13/2016  . MAMMOGRAM  02/23/2016  . PAP SMEAR  02/25/2017  . TETANUS/TDAP  06/09/2020  . COLONOSCOPY  02/23/2021  . ZOSTAVAX  Completed   Health Maintenance Review LIFESTYLE:  Exercise:  n Tobacco/ETS:n Alcohol: per day soc Sugar beverages: tea and pepsi per day Sleep:as above Drug use: no   ROS:  GEN/ HEENT: No fever, significant weight changes sweats headaches vision problems hearing changes, CV/ PULM; No chest pain shortness of breath cough, syncope,edema  change in exercise tolerance. GI /GU: No adominal pain, vomiting, change in bowel habits. No blood in the stool. No significant GU symptoms. SKIN/HEME: ,no acute skin rashes suspicious lesions or bleeding. No lymphadenopathy, nodules, masses.  NEURO/ PSYCH:  No neurologic signs such as weakness numbness. No depression anxiety. IMM/ Allergy: No unusual infections.  Allergy .   REST of 12 system review negative except as per HPI   Past Medical History  Diagnosis Date  . OSA (obstructive sleep apnea)     moderatley severe  . Chest pain     atypical  . Hypertension   . GERD (gastroesophageal reflux disease)   .  Allergy   . Nasal septal deviation   . Odontogenic cyst     of left maxillary sinus  . History of pneumonia   . CHEST DISCOMFORT 08/30/2007    Qualifier: Diagnosis of  By: Fabian Sharp MD, Neta Mends   . OBSTRUCTIVE SLEEP APNEA 08/30/2007    Qualifier: Diagnosis of  By: Fabian Sharp MD, Neta Mends     Past Surgical History  Procedure Laterality Date  . Cholecystectomy    . Tonsillectomy    . Tubal ligation    . Left thumb trigger finger release    . Biopsy and bone graft of left maxillary sinus odontogenic cyst      Family History  Problem Relation Age of Onset  . Heart disease Mother   . Stroke Father   . Heart failure Father   . Emphysema Father   . Snoring Brother   . Lung cancer Brother   . Rheum arthritis Daughter     Social History   Social History  . Marital Status: Married    Spouse Name: N/A  . Number of Children: 3  . Years of Education: N/A   Occupational History  . retired    Social History Main Topics  . Smoking status: Former Smoker -- 1.00 packs/day for 30 years    Types: Cigarettes    Quit date: 06/14/1998  . Smokeless tobacco: Never Used  . Alcohol Use: Yes     Comment: once a  month  . Drug Use: No  . Sexual Activity: Yes    Birth Control/ Protection: None   Other Topics Concern  . 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 hydrochlorothiazide (HYDRODIURIL) 25 MG tablet TAKE 1 TABLET BY MOUTH DAILY 90 tablet 0  . LORazepam (ATIVAN) 0.5 MG tablet TAKE 1-2 TABLETS BY MOUTH BEFORE FLIGHT AS NEEDED AND AS DIRECTED Do not take with ambien 6 tablet 0  . zolpidem (AMBIEN) 10 MG tablet Take 1 tablet (10 mg total) by mouth at bedtime as needed. for sleep 30  tablet 4   No facility-administered medications prior to visit.     EXAM:  BP 130/80 mmHg  Temp(Src) 98.8 F (37.1 C) (Oral)  Ht 5' 3.5" (1.613 m)  Wt 229 lb 12.8 oz (104.237 kg)  BMI 40.06 kg/m2  Body mass index is 40.06 kg/(m^2).  Physical Exam: Vital signs reviewed QMV:HQIO is a well-developed well-nourished alert cooperative    who appearsr stated age in no acute distress.  HEENT: normocephalic atraumatic , Eyes: PERRL EOM's full, conjunctiva clear, Nares: paten,t no deformity discharge or tenderness., Ears: no deformity EAC's clear TMs with normal landmarks. Mouth: clear OP, no lesions, edema.  Moist mucous membranes. Dentition in adequate repair. NECK: supple without masses, thyromegaly or bruits. CHEST/PULM:  Clear to auscultation and percussion breath sounds equal no wheeze , rales or rhonchi. No chest wall deformities or tenderness.Breast: normal by inspection . No dimpling, discharge, masses, tenderness or discharge . CV: PMI is nondisplaced, S1 S2 no gallops, murmurs, rubs. Peripheral pulses are full without delay.No JVD .  ABDOMEN: Bowel sounds normal nontender  No guard or rebound, no hepato splenomegal no CVA tenderness.  No hernia. Extremtities:  No clubbing cyanosis or edema, no acute joint swelling or redness no focal atrophy NEURO:  Oriented x3, cranial nerves 3-12 appear to be intact, no obvious focal weakness,gait within normal limits no abnormal reflexes or asymmetrical SKIN: No acute rashes normal turgor, color, no bruising or petechiae. PSYCH: Oriented, good eye contact, no obvious depression anxiety, cognition and judgment appear normal. LN: no cervical axillary inguinal adenopathy  Lab Results  Component Value Date   WBC 6.3 03/31/2015   HGB 14.9 03/31/2015   HCT 43.8 03/31/2015   PLT 228.0 03/31/2015   GLUCOSE 115* 03/31/2015   CHOL 176 03/31/2015   TRIG 161.0* 03/31/2015   HDL 47.60 03/31/2015   LDLDIRECT 121.8 02/26/2011   LDLCALC 96 03/31/2015    ALT 22 03/31/2015   AST 15 03/31/2015   NA 144 03/31/2015   K 3.8 03/31/2015   CL 108 03/31/2015   CREATININE 0.74 03/31/2015   BUN 18 03/31/2015   CO2 26 03/31/2015   TSH 1.65 03/31/2015   BP Readings from Last 3 Encounters:  04/08/15 130/80  04/01/15 130/60  12/03/14 132/78   Wt Readings from Last 3 Encounters:  04/08/15 229 lb 12.8 oz (104.237 kg)  04/01/15 233 lb 6.4 oz (105.87 kg)  12/03/14 229 lb 6.4 oz (104.055 kg)     ASSESSMENT  AND PLAN:  Discussed the following assessment and plan:  Visit for preventive health examination  Hyperglycemia - had pepsi that am? still has risk avoid sugar beverages check a1c pre next visit   Complex sleep apnea syndrome  Insomnia - see text  Medication management  Need for prophylactic vaccination and inoculation against influenza - Plan: Flu Vaccine QUAD 36+ mos PF IM (Fluarix & Fluzone Quad PF)  Benign hypertensive heart disease without heart failure - controlled  dsic sleep and risk benefit of meds she is dependent on the [place] but still awakens and not an optimum solutions   Goal would be to get off all meds but at least transition ot possible less  Problematic medication.  HA on belsomra  rx for 10 mg  If wants to try  Alternate days and then may call for trying increase   Dose  Coupon given .   Warned that will go through [place] [facility]   Patient Care Team: Madelin Headings, MD as PCP - General Cassell Clement, MD (Cardiology) Patient Instructions  stop sugar beverages   As we discussed or at least 3 weeks off and then  Limit. Plan hga1c pre visit in 6 months  Your sugar levels are at risk for getting diabetes.  Continued caution with ambien  Try 5 mg instead of 10 mg .   Health Maintenance, Female Adopting a healthy lifestyle and getting preventive care can go a long way to promote health and wellness. Talk with your health care provider about what schedule of regular examinations is right for you. This is a good chance for  you to check in with your provider about disease prevention and staying healthy. In between checkups, there are plenty of things you can do on your own. Experts have done a lot of research about which lifestyle changes and preventive measures are most likely to keep you healthy. Ask your health care provider for more information. WEIGHT AND DIET  Eat a healthy diet  Be sure to include plenty of vegetables, fruits, low-fat dairy products, and lean protein.  Do not eat a lot of foods high in solid fats, added sugars, or salt.  Get regular exercise. This is one of the most important things you can do for your health.  Most adults should exercise for at least 150 minutes each week. The exercise should increase your heart rate and make you sweat (moderate-intensity exercise).  Most adults should also do strengthening exercises at least twice a week. This is in addition to the moderate-intensity exercise.  Maintain a healthy weight  Body mass index (BMI) is a measurement that can be used to identify possible weight problems. It estimates body fat based on height and weight. Your health care provider can help determine your BMI and help you achieve or maintain a healthy weight.  For females 59 years of age and older:   A BMI below 18.5 is considered underweight.  A BMI of 18.5 to 24.9 is normal.  A BMI of 25 to 29.9 is considered overweight.  A BMI of 30 and above is considered obese.  Watch levels of cholesterol and blood lipids  You should start having your blood tested for lipids and cholesterol at 64 years of age, then have this test every 5 years.  You may need to have your cholesterol levels checked more often if:  Your lipid or cholesterol levels are high.  You are older than 64 years of age.  You are at high risk  for heart disease.  CANCER SCREENING   Lung Cancer  Lung cancer screening is recommended for adults 15-28 years old who are at high risk for lung cancer  because of a history of smoking.  A yearly low-dose CT scan of the lungs is recommended for people who:  Currently smoke.  Have quit within the past 15 years.  Have at least a 30-pack-year history of smoking. A pack year is smoking an average of one pack of cigarettes a day for 1 year.  Yearly screening should continue until it has been 15 years since you quit.  Yearly screening should stop if you develop a health problem that would prevent you from having lung cancer treatment.  Breast Cancer  Practice breast self-awareness. This means understanding how your breasts normally appear and feel.  It also means doing regular breast self-exams. Let your health care provider know about any changes, no matter how small.  If you are in your 20s or 30s, you should have a clinical breast exam (CBE) by a health care provider every 1-3 years as part of a regular health exam.  If you are 89 or older, have a CBE every year. Also consider having a breast X-ray (mammogram) every year.  If you have a family history of breast cancer, talk to your health care provider about genetic screening.  If you are at high risk for breast cancer, talk to your health care provider about having an MRI and a mammogram every year.  Breast cancer gene (BRCA) assessment is recommended for women who have family members with BRCA-related cancers. BRCA-related cancers include:  Breast.  Ovarian.  Tubal.  Peritoneal cancers.  Results of the assessment will determine the need for genetic counseling and BRCA1 and BRCA2 testing. Cervical Cancer Your health care provider may recommend that you be screened regularly for cancer of the pelvic organs (ovaries, uterus, and vagina). This screening involves a pelvic examination, including checking for microscopic changes to the surface of your cervix (Pap test). You may be encouraged to have this screening done every 3 years, beginning at age 74.  For women ages 49-65,  health care providers may recommend pelvic exams and Pap testing every 3 years, or they may recommend the Pap and pelvic exam, combined with testing for human papilloma virus (HPV), every 5 years. Some types of HPV increase your risk of cervical cancer. Testing for HPV may also be done on women of any age with unclear Pap test results.  Other health care providers may not recommend any screening for nonpregnant women who are considered low risk for pelvic cancer and who do not have symptoms. Ask your health care provider if a screening pelvic exam is right for you.  If you have had past treatment for cervical cancer or a condition that could lead to cancer, you need Pap tests and screening for cancer for at least 20 years after your treatment. If Pap tests have been discontinued, your risk factors (such as having a new sexual partner) need to be reassessed to determine if screening should resume. Some women have medical problems that increase the chance of getting cervical cancer. In these cases, your health care provider may recommend more frequent screening and Pap tests. Colorectal Cancer  This type of cancer can be detected and often prevented.  Routine colorectal cancer screening usually begins at 64 years of age and continues through 64 years of age.  Your health care provider may recommend screening at an earlier age if  you have risk factors for colon cancer.  Your health care provider may also recommend using home test kits to check for hidden blood in the stool.  A small camera at the end of a tube can be used to examine your colon directly (sigmoidoscopy or colonoscopy). This is done to check for the earliest forms of colorectal cancer.  Routine screening usually begins at age 36.  Direct examination of the colon should be repeated every 5-10 years through 64 years of age. However, you may need to be screened more often if early forms of precancerous polyps or small growths are  found. Skin Cancer  Check your skin from head to toe regularly.  Tell your health care provider about any new moles or changes in moles, especially if there is a change in a mole's shape or color.  Also tell your health care provider if you have a mole that is larger than the size of a pencil eraser.  Always use sunscreen. Apply sunscreen liberally and repeatedly throughout the day.  Protect yourself by wearing long sleeves, pants, a wide-brimmed hat, and sunglasses whenever you are outside. HEART DISEASE, DIABETES, AND HIGH BLOOD PRESSURE   High blood pressure causes heart disease and increases the risk of stroke. High blood pressure is more likely to develop in:  People who have blood pressure in the high end of the normal range (130-139/85-89 mm Hg).  People who are overweight or obese.  People who are African American.  If you are 18-50 years of age, have your blood pressure checked every 3-5 years. If you are 108 years of age or older, have your blood pressure checked every year. You should have your blood pressure measured twice--once when you are at a hospital or clinic, and once when you are not at a hospital or clinic. Record the average of the two measurements. To check your blood pressure when you are not at a hospital or clinic, you can use:  An automated blood pressure machine at a pharmacy.  A home blood pressure monitor.  If you are between 59 years and 20 years old, ask your health care provider if you should take aspirin to prevent strokes.  Have regular diabetes screenings. This involves taking a blood sample to check your fasting blood sugar level.  If you are at a normal weight and have a low risk for diabetes, have this test once every three years after 64 years of age.  If you are overweight and have a high risk for diabetes, consider being tested at a younger age or more often. PREVENTING INFECTION  Hepatitis B  If you have a higher risk for hepatitis B,  you should be screened for this virus. You are considered at high risk for hepatitis B if:  You were born in a country where hepatitis B is common. Ask your health care provider which countries are considered high risk.  Your parents were born in a high-risk country, and you have not been immunized against hepatitis B (hepatitis B vaccine).  You have HIV or AIDS.  You use needles to inject street drugs.  You live with someone who has hepatitis B.  You have had sex with someone who has hepatitis B.  You get hemodialysis treatment.  You take certain medicines for conditions, including cancer, organ transplantation, and autoimmune conditions. Hepatitis C  Blood testing is recommended for:  Everyone born from [birth year] through 1965.  Anyone with known risk factors for hepatitis C. Sexually transmitted infections (  STIs)  You should be screened for sexually transmitted infections (STIs) including gonorrhea and chlamydia if:  You are sexually active and are younger than 64 years of age.  You are older than 64 years of age and your health care provider tells you that you are at risk for this type of infection.  Your sexual activity has changed since you were last screened and you are at an increased risk for chlamydia or gonorrhea. Ask your health care provider if you are at risk.  If you do not have HIV, but are at risk, it may be recommended that you take a prescription medicine daily to prevent HIV infection. This is called pre-exposure prophylaxis (PrEP). You are considered at risk if:  You are sexually active and do not regularly use condoms or know the HIV status of your partner(s).  You take drugs by injection.  You are sexually active with a partner who has HIV. Talk with your health care provider about whether you are at high risk of being infected with HIV. If you choose to begin PrEP, you should first be tested for HIV. You should then be tested every 3 months for as long as  you are taking PrEP.  PREGNANCY   If you are premenopausal and you may become pregnant, ask your health care provider about preconception counseling.  If you may become pregnant, take 400 to 800 micrograms (mcg) of folic acid every day.  If you want to prevent pregnancy, talk to your health care provider about birth control (contraception). OSTEOPOROSIS AND MENOPAUSE   Osteoporosis is a disease in which the bones lose minerals and strength with aging. This can result in serious bone fractures. Your risk for osteoporosis can be identified using a bone density scan.  If you are 21 years of age or older, or if you are at risk for osteoporosis and fractures, ask your health care provider if you should be screened.  Ask your health care provider whether you should take a calcium or vitamin D supplement to lower your risk for osteoporosis.  Menopause may have certain physical symptoms and risks.  Hormone replacement therapy may reduce some of these symptoms and risks. Talk to your health care provider about whether hormone replacement therapy is right for you.  HOME CARE INSTRUCTIONS   Schedule regular health, dental, and eye exams.  Stay current with your immunizations.   Do not use any tobacco products including cigarettes, chewing tobacco, or electronic cigarettes.  If you are pregnant, do not drink alcohol.  If you are breastfeeding, limit how much and how often you drink alcohol.  Limit alcohol intake to no more than 1 drink per day for nonpregnant women. One drink equals 12 ounces of beer, 5 ounces of wine, or 1 ounces of hard liquor.  Do not use street drugs.  Do not share needles.  Ask your health care provider for help if you need support or information about quitting drugs.  Tell your health care provider if you often feel depressed.  Tell your health care provider if you have ever been abused or do not feel safe at home.   This information is not intended to replace  advice given to you by your health care provider. Make sure you discuss any questions you have with your health care provider.   Document Released: 12/14/2010 Document Revised: 06/21/2014 Document Reviewed: 05/02/2013 Elsevier Interactive Patient Education 2016 [employer].       Mertzon. Panosh M.D.

## 2015-04-11 ENCOUNTER — Encounter: Payer: Self-pay | Admitting: Pulmonary Disease

## 2015-04-25 ENCOUNTER — Other Ambulatory Visit: Payer: Self-pay | Admitting: Pulmonary Disease

## 2015-04-25 DIAGNOSIS — G4733 Obstructive sleep apnea (adult) (pediatric): Secondary | ICD-10-CM

## 2015-04-25 DIAGNOSIS — Z9989 Dependence on other enabling machines and devices: Principal | ICD-10-CM

## 2015-05-21 DIAGNOSIS — M65312 Trigger thumb, left thumb: Secondary | ICD-10-CM | POA: Insufficient documentation

## 2015-05-23 ENCOUNTER — Encounter (HOSPITAL_BASED_OUTPATIENT_CLINIC_OR_DEPARTMENT_OTHER): Payer: Self-pay | Admitting: *Deleted

## 2015-05-23 ENCOUNTER — Other Ambulatory Visit: Payer: Self-pay | Admitting: Orthopedic Surgery

## 2015-05-23 NOTE — Progress Notes (Signed)
Patient has OSA and wears CPAP nightly. She will bring her machine dos.

## 2015-05-26 ENCOUNTER — Encounter (HOSPITAL_BASED_OUTPATIENT_CLINIC_OR_DEPARTMENT_OTHER)
Admission: RE | Admit: 2015-05-26 | Discharge: 2015-05-26 | Disposition: A | Discharge status: Home / Self Care | Payer: 59 | Source: Ambulatory Visit | Attending: Orthopedic Surgery | Admitting: Orthopedic Surgery

## 2015-05-26 DIAGNOSIS — Z9049 Acquired absence of other specified parts of digestive tract: Secondary | ICD-10-CM | POA: Diagnosis not present

## 2015-05-26 DIAGNOSIS — M65842 Other synovitis and tenosynovitis, left hand: Secondary | ICD-10-CM | POA: Diagnosis present

## 2015-05-26 DIAGNOSIS — Z87891 Personal history of nicotine dependence: Secondary | ICD-10-CM | POA: Diagnosis not present

## 2015-05-26 DIAGNOSIS — Z79899 Other long term (current) drug therapy: Secondary | ICD-10-CM | POA: Diagnosis not present

## 2015-05-26 DIAGNOSIS — I10 Essential (primary) hypertension: Secondary | ICD-10-CM | POA: Diagnosis not present

## 2015-05-26 DIAGNOSIS — G4733 Obstructive sleep apnea (adult) (pediatric): Secondary | ICD-10-CM | POA: Diagnosis not present

## 2015-05-26 DIAGNOSIS — Z6839 Body mass index (BMI) 39.0-39.9, adult: Secondary | ICD-10-CM | POA: Diagnosis not present

## 2015-05-26 LAB — BASIC METABOLIC PANEL
ANION GAP: 8 (ref 5–15)
BUN: 11 mg/dL (ref 6–20)
CO2: 28 mmol/L (ref 22–32)
Calcium: 8.9 mg/dL (ref 8.9–10.3)
Chloride: 104 mmol/L (ref 101–111)
Creatinine, Ser: 0.87 mg/dL (ref 0.44–1.00)
GFR calc Af Amer: >60 mL/min (ref >60)
Glucose, Bld: 144 mg/dL — ABNORMAL HIGH (ref 65–99)
POTASSIUM: 3.5 mmol/L (ref 3.5–5.1)
SODIUM: 140 mmol/L (ref 135–145)

## 2015-05-27 ENCOUNTER — Ambulatory Visit (HOSPITAL_BASED_OUTPATIENT_CLINIC_OR_DEPARTMENT_OTHER)
Admission: RE | Admit: 2015-05-27 | Discharge: 2015-05-27 | Disposition: A | Discharge status: Home / Self Care | Payer: 59 | Source: Ambulatory Visit | Attending: Orthopedic Surgery | Admitting: Orthopedic Surgery

## 2015-05-27 ENCOUNTER — Ambulatory Visit (HOSPITAL_BASED_OUTPATIENT_CLINIC_OR_DEPARTMENT_OTHER): Payer: 59 | Admitting: Anesthesiology

## 2015-05-27 ENCOUNTER — Encounter (HOSPITAL_BASED_OUTPATIENT_CLINIC_OR_DEPARTMENT_OTHER)
Admission: RE | Disposition: A | Discharge status: Home / Self Care | Payer: Self-pay | Source: Ambulatory Visit | Attending: Orthopedic Surgery

## 2015-05-27 ENCOUNTER — Encounter (HOSPITAL_BASED_OUTPATIENT_CLINIC_OR_DEPARTMENT_OTHER): Payer: Self-pay | Admitting: Orthopedic Surgery

## 2015-05-27 DIAGNOSIS — Z6839 Body mass index (BMI) 39.0-39.9, adult: Secondary | ICD-10-CM | POA: Insufficient documentation

## 2015-05-27 DIAGNOSIS — Z87891 Personal history of nicotine dependence: Secondary | ICD-10-CM | POA: Insufficient documentation

## 2015-05-27 DIAGNOSIS — I10 Essential (primary) hypertension: Secondary | ICD-10-CM | POA: Insufficient documentation

## 2015-05-27 DIAGNOSIS — M65842 Other synovitis and tenosynovitis, left hand: Secondary | ICD-10-CM | POA: Insufficient documentation

## 2015-05-27 DIAGNOSIS — G4733 Obstructive sleep apnea (adult) (pediatric): Secondary | ICD-10-CM | POA: Insufficient documentation

## 2015-05-27 DIAGNOSIS — Z79899 Other long term (current) drug therapy: Secondary | ICD-10-CM | POA: Insufficient documentation

## 2015-05-27 DIAGNOSIS — Z9049 Acquired absence of other specified parts of digestive tract: Secondary | ICD-10-CM | POA: Insufficient documentation

## 2015-05-27 HISTORY — PX: TRIGGER FINGER RELEASE: SHX641

## 2015-05-27 HISTORY — DX: Unspecified osteoarthritis, unspecified site: M19.90

## 2015-05-27 HISTORY — DX: Trigger thumb, left thumb: M65.312

## 2015-05-27 SURGERY — RELEASE, A1 PULLEY, FOR TRIGGER FINGER
Anesthesia: Regional | Site: Thumb | Laterality: Left

## 2015-05-27 MED ORDER — ONDANSETRON HCL 4 MG/2ML IJ SOLN
INTRAMUSCULAR | Status: AC
  Filled 2015-05-27: qty 2

## 2015-05-27 MED ORDER — BUPIVACAINE HCL (PF) 0.25 % IJ SOLN
INTRAMUSCULAR | Status: DC | PRN
  Administered 2015-05-27: 4 mL

## 2015-05-27 MED ORDER — GLYCOPYRROLATE 0.2 MG/ML IJ SOLN: 0.2000 mg | Freq: Once | INTRAMUSCULAR | Status: DC | PRN

## 2015-05-27 MED ORDER — LIDOCAINE HCL (CARDIAC) 20 MG/ML IV SOLN
INTRAVENOUS | Status: DC | PRN
  Administered 2015-05-27: 30 mg via INTRAVENOUS

## 2015-05-27 MED ORDER — MIDAZOLAM HCL 2 MG/2ML IJ SOLN
INTRAMUSCULAR | Status: AC
  Filled 2015-05-27: qty 2

## 2015-05-27 MED ORDER — SODIUM CHLORIDE 0.9 % IJ SOLN
INTRAMUSCULAR | Status: AC
  Filled 2015-05-27: qty 10

## 2015-05-27 MED ORDER — CEFAZOLIN SODIUM-DEXTROSE 2-3 GM-% IV SOLR
INTRAVENOUS | Status: AC
  Filled 2015-05-27: qty 50

## 2015-05-27 MED ORDER — CHLORHEXIDINE GLUCONATE 4 % EX LIQD: 60.0000 mL | Freq: Once | CUTANEOUS | Status: DC

## 2015-05-27 MED ORDER — PROPOFOL 10 MG/ML IV BOLUS
INTRAVENOUS | Status: DC | PRN
  Administered 2015-05-27 (×2): 10 mg via INTRAVENOUS
  Administered 2015-05-27: 30 mg via INTRAVENOUS

## 2015-05-27 MED ORDER — FENTANYL CITRATE (PF) 100 MCG/2ML IJ SOLN
INTRAMUSCULAR | Status: AC
  Filled 2015-05-27: qty 2

## 2015-05-27 MED ORDER — CEFAZOLIN SODIUM 1 G IJ SOLR
INTRAMUSCULAR | Status: AC
  Filled 2015-05-27: qty 10

## 2015-05-27 MED ORDER — ACETAMINOPHEN-CODEINE #3 300-30 MG PO TABS: 1.0000 | ORAL_TABLET | ORAL | Status: DC | PRN

## 2015-05-27 MED ORDER — CEFAZOLIN SODIUM-DEXTROSE 2-3 GM-% IV SOLR
2.0000 g | INTRAVENOUS | Status: AC
  Administered 2015-05-27: 2 g via INTRAVENOUS

## 2015-05-27 MED ORDER — ONDANSETRON HCL 4 MG/2ML IJ SOLN
INTRAMUSCULAR | Status: DC | PRN
  Administered 2015-05-27: 4 mg via INTRAVENOUS

## 2015-05-27 MED ORDER — CEFAZOLIN SODIUM-DEXTROSE 2-3 GM-% IV SOLR: 2.0000 g | INTRAVENOUS | Status: DC

## 2015-05-27 MED ORDER — LACTATED RINGERS IV SOLN
INTRAVENOUS | Status: DC
  Administered 2015-05-27: 09:00:00 via INTRAVENOUS

## 2015-05-27 MED ORDER — SCOPOLAMINE 1 MG/3DAYS TD PT72: 1.0000 | MEDICATED_PATCH | Freq: Once | TRANSDERMAL | Status: DC

## 2015-05-27 MED ORDER — MIDAZOLAM HCL 2 MG/2ML IJ SOLN
1.0000 mg | INTRAMUSCULAR | Status: DC | PRN
  Administered 2015-05-27: 1 mg via INTRAVENOUS

## 2015-05-27 MED ORDER — LIDOCAINE HCL (CARDIAC) 20 MG/ML IV SOLN
INTRAVENOUS | Status: AC
  Filled 2015-05-27: qty 5

## 2015-05-27 MED ORDER — FENTANYL CITRATE (PF) 100 MCG/2ML IJ SOLN
50.0000 ug | INTRAMUSCULAR | Status: DC | PRN
  Administered 2015-05-27: 50 ug via INTRAVENOUS

## 2015-05-27 SURGICAL SUPPLY — 33 items
BANDAGE COBAN STERILE 2 (GAUZE/BANDAGES/DRESSINGS) ×2 IMPLANT
BLADE SURG 15 STRL LF DISP TIS (BLADE) ×1 IMPLANT
BLADE SURG 15 STRL SS (BLADE) ×2
BNDG CMPR 9X4 STRL LF SNTH (GAUZE/BANDAGES/DRESSINGS)
BNDG ESMARK 4X9 LF (GAUZE/BANDAGES/DRESSINGS) IMPLANT
CHLORAPREP W/TINT 26ML (MISCELLANEOUS) ×2 IMPLANT
CORDS BIPOLAR (ELECTRODE) ×2 IMPLANT
COVER BACK TABLE 60X90IN (DRAPES) ×2 IMPLANT
COVER MAYO STAND STRL (DRAPES) ×2 IMPLANT
CUFF TOURNIQUET SINGLE 18IN (TOURNIQUET CUFF) ×2 IMPLANT
DECANTER SPIKE VIAL GLASS SM (MISCELLANEOUS) IMPLANT
DRAPE EXTREMITY T 121X128X90 (DRAPE) ×2 IMPLANT
DRAPE SURG 17X23 STRL (DRAPES) ×2 IMPLANT
GAUZE SPONGE 4X4 12PLY STRL (GAUZE/BANDAGES/DRESSINGS) ×2 IMPLANT
GAUZE XEROFORM 1X8 LF (GAUZE/BANDAGES/DRESSINGS) ×2 IMPLANT
GLOVE BIO SURGEON STRL SZ 6.5 (GLOVE) ×2 IMPLANT
GLOVE BIOGEL PI IND STRL 7.0 (GLOVE) ×2 IMPLANT
GLOVE BIOGEL PI IND STRL 8.5 (GLOVE) ×1 IMPLANT
GLOVE BIOGEL PI INDICATOR 7.0 (GLOVE) ×2
GLOVE BIOGEL PI INDICATOR 8.5 (GLOVE) ×1
GLOVE SURG ORTHO 8.0 STRL STRW (GLOVE) ×2 IMPLANT
GOWN STRL REUS W/ TWL LRG LVL3 (GOWN DISPOSABLE) ×1 IMPLANT
GOWN STRL REUS W/TWL LRG LVL3 (GOWN DISPOSABLE) ×2
GOWN STRL REUS W/TWL XL LVL3 (GOWN DISPOSABLE) ×2 IMPLANT
NEEDLE PRECISIONGLIDE 27X1.5 (NEEDLE) ×2 IMPLANT
NS IRRIG 1000ML POUR BTL (IV SOLUTION) ×2 IMPLANT
PACK BASIN DAY SURGERY FS (CUSTOM PROCEDURE TRAY) ×2 IMPLANT
STOCKINETTE 4X48 STRL (DRAPES) ×2 IMPLANT
SUT ETHILON 4 0 PS 2 18 (SUTURE) ×4 IMPLANT
SYR BULB 3OZ (MISCELLANEOUS) ×2 IMPLANT
SYR CONTROL 10ML LL (SYRINGE) ×2 IMPLANT
TOWEL OR 17X24 6PK STRL BLUE (TOWEL DISPOSABLE) ×2 IMPLANT
UNDERPAD 30X30 (UNDERPADS AND DIAPERS) ×2 IMPLANT

## 2015-05-27 NOTE — H&P (Signed)
Rachel Blair is an 64 y.o. female.   Chief Complaint: .  Approximately 6 months ago she began having catching of her left thumb at the IP joint. She has tried Aleve. She is right handed, 6'3". She recalls no history of injury.  PAST MEDICAL HISTORY:  She is allergic to Codeine. She is on HCTZ and Zolpidem. She has had tonsillectomy, cholecystectomy, tubal ligation, and trigger finger right thumb. This has been injected on 2 occasions without relief.  FAMILY MEDICAL HISTORY: Positive for arthritis otherwise negative.  SOCIAL HISTORY:  She does not smoke having quit 20 years ago. She drinks socially. She is married and retired.  REVIEW OF SYSTEMS: Positive for glasses, high BP, sleep disorder, otherwise negative 14 points.  HPI: see above  Past Medical History  Diagnosis Date  . Chest pain     atypical  . Hypertension   . Allergy   . Nasal septal deviation   . Odontogenic cyst     of left maxillary sinus  . History of pneumonia   . CHEST DISCOMFORT 08/30/2007    Qualifier: Diagnosis of  By: Regis Bill MD, Standley Brooking   . OSA (obstructive sleep apnea)     moderatley severe, uses CPAP nightly  . OBSTRUCTIVE SLEEP APNEA 08/30/2007    Qualifier: Diagnosis of  By: Regis Bill MD, Standley Brooking   . Arthritis   . Trigger thumb of left hand     Past Surgical History  Procedure Laterality Date  . Cholecystectomy    . Tonsillectomy    . Tubal ligation    . Left thumb trigger finger release Right   . Biopsy and bone graft of left maxillary sinus odontogenic cyst      Family History  Problem Relation Age of Onset  . Heart disease Mother   . Stroke Father   . Heart failure Father   . Emphysema Father   . Snoring Brother   . Lung cancer Brother   . Rheum arthritis Daughter    Social History:  reports that she quit smoking about 16 years ago. Her smoking use included Cigarettes. She has a 30 pack-year smoking history. She has never used smokeless tobacco. She reports that she drinks alcohol. She  reports that she does not use illicit drugs.  Allergies:  Allergies  Allergen Reactions  . Amphetamine-Dextroamphet Er     REACTION: Coming out of skin  . Duloxetine     REACTION: Sleepy and nausea  . Hydrocodone-Acetaminophen     REACTION: abdominal pain  . Losartan     Chest pain  . Morphine Sulfate     REACTION: abdominal pain    No prescriptions prior to admission    Results for orders placed or performed during the hospital encounter of 05/27/15 (from the past 48 hour(s))  Basic metabolic panel     Status: Abnormal   Collection Time: 05/26/15  8:40 AM  Result Value Ref Range   Sodium 140 135 - 145 mmol/L   Potassium 3.5 3.5 - 5.1 mmol/L   Chloride 104 101 - 111 mmol/L   CO2 28 22 - 32 mmol/L   Glucose, Bld 144 (H) 65 - 99 mg/dL   BUN 11 6 - 20 mg/dL   Creatinine, Ser 0.87 0.44 - 1.00 mg/dL   Calcium 8.9 8.9 - 10.3 mg/dL   GFR calc non Af Amer >60 >60 mL/min   GFR calc Af Amer >60 >60 mL/min    Comment: (NOTE) The eGFR has been calculated using the  CKD EPI equation. This calculation has not been validated in all clinical situations. eGFR's persistently <60 mL/min signify possible Chronic Kidney Disease.    Anion gap 8 5 - 15    No results found.   Pertinent items are noted in HPI.  Height 5' 4" (1.626 m), weight 103.874 kg (229 lb).  General appearance: alert, cooperative and appears stated age Head: Normocephalic, without obvious abnormality Neck: no JVD Resp: clear to auscultation bilaterally Cardio: regular rate and rhythm, S1, S2 normal, no murmur, click, rub or gallop GI: soft, non-tender; bowel sounds normal; no masses,  no organomegaly Extremities: catching left thumb Pulses: 2+ and symmetric Skin: Skin color, texture, turgor normal. No rashes or lesions Neurologic: Grossly normal Incision/Wound: na  Assessment/Plan Diagnosis: STS left thumb  We have discussed possibility of surgical release with her, the pre, peri and postoperative course  were discussed along with the risks and complications.  The patient is aware there is no guarantee with the surgery, possibility of infection, recurrence, injury to arteries, nerves, tendons, incomplete relief of symptoms and dystrophy. Plan release A-1 pulley left thumb  [name] 05/27/2015, 8:43 AM

## 2015-05-27 NOTE — Discharge Instructions (Addendum)

## 2015-05-27 NOTE — Anesthesia Preprocedure Evaluation (Signed)
Anesthesia Evaluation  Patient identified by MRN, date of birth, ID band Patient awake    Reviewed: Allergy & Precautions, NPO status , Patient's Chart, lab work & pertinent test results  Airway Mallampati: II  TM Distance: >3 FB Neck ROM: Full    Dental no notable dental hx.    Pulmonary sleep apnea and Continuous Positive Airway Pressure Ventilation , former smoker,    Pulmonary exam normal breath sounds clear to auscultation       Cardiovascular hypertension, Normal cardiovascular exam Rhythm:Regular Rate:Normal     Neuro/Psych negative neurological ROS  negative psych ROS   GI/Hepatic negative GI ROS, Neg liver ROS,   Endo/Other  Morbid obesity  Renal/GU negative Renal ROS  negative genitourinary   Musculoskeletal negative musculoskeletal ROS (+)   Abdominal   Peds negative pediatric ROS (+)  Hematology negative hematology ROS (+)   Anesthesia Other Findings   Reproductive/Obstetrics negative OB ROS                             Anesthesia Physical Anesthesia Plan  ASA: III  Anesthesia Plan: Bier Block   Post-op Pain Management:    Induction: Intravenous  Airway Management Planned: Simple Face Mask  Additional Equipment:   Intra-op Plan:   Post-operative Plan: Extubation in OR  Informed Consent: I have reviewed the patients History and Physical, chart, labs and discussed the procedure including the risks, benefits and alternatives for the proposed anesthesia with the patient or authorized representative who has indicated his/her understanding and acceptance.   Dental advisory given  Plan Discussed with: CRNA and Surgeon  Anesthesia Plan Comments:         Anesthesia Quick Evaluation

## 2015-05-27 NOTE — Transfer of Care (Signed)
Immediate Anesthesia Transfer of Care Note  Patient: Rachel Blair  Procedure(s) Performed: Procedure(s): RELEASE TRIGGER FINGER/A-1 PULLEY LEFT THUMB (Left)  Patient Location: PACU  Anesthesia Type:Bier block  Level of Consciousness: awake, alert , oriented and patient cooperative  Airway & Oxygen Therapy: Patient Spontanous Breathing and Patient connected to face mask oxygen  Post-op Assessment: Report given to RN and Post -op Vital signs reviewed and stable  Post vital signs: Reviewed and stable  Last Vitals:  Filed Vitals:   05/27/15 0907  BP: 123/74  Pulse: 79  Temp: 36.9 C  Resp: 20    Complications: No apparent anesthesia complications

## 2015-05-27 NOTE — Brief Op Note (Signed)
05/27/2015  10:28 AM  PATIENT:  Rachel HalimIva T Blair  64 y.o. female  PRE-OPERATIVE DIAGNOSIS:  LEFT THUMB STENOSING TENOSYNOVITIS  POST-OPERATIVE DIAGNOSIS:  LEFT THUMB STENOSING TENOSYNOVITIS  PROCEDURE:  Procedure(s): RELEASE TRIGGER FINGER/A-1 PULLEY LEFT THUMB (Left)  SURGEON:  Surgeon(s) and Role:    * Cindee SaltGary Austin Pongratz, MD - Primary  PHYSICIAN ASSISTANT:   ASSISTANTS: none   ANESTHESIA:   local and regional  EBL:  Total I/O In: 600 [I.V.:600] Out: -   BLOOD ADMINISTERED:none  DRAINS: none   LOCAL MEDICATIONS USED:  BUPIVICAINE   SPECIMEN:  No Specimen  DISPOSITION OF SPECIMEN:  N/A  COUNTS:  YES  TOURNIQUET:   Total Tourniquet Time Documented: Forearm (Left) - 16 minutes Total: Forearm (Left) - 16 minutes   DICTATION: .Other Dictation: Dictation Number [id number]  PLAN OF CARE: Discharge to home after PACU  PATIENT DISPOSITION:  PACU - hemodynamically stable.

## 2015-05-27 NOTE — Anesthesia Postprocedure Evaluation (Signed)
Anesthesia Post Note  Patient: Rachel Blair  Procedure(s) Performed: Procedure(s) (LRB): RELEASE TRIGGER FINGER/A-1 PULLEY LEFT THUMB (Left)  Patient location during evaluation: PACU Anesthesia Type: Bier Block Level of consciousness: awake and alert Pain management: pain level controlled Vital Signs Assessment: post-procedure vital signs reviewed and stable Respiratory status: spontaneous breathing and respiratory function stable Cardiovascular status: blood pressure returned to baseline and stable Postop Assessment: no signs of nausea or vomiting Anesthetic complications: no    Last Vitals:  Filed Vitals:   05/27/15 1030 05/27/15 1045  BP: 126/73   Pulse: 78 76  Temp:    Resp: 18 19    Last Pain:  Filed Vitals:   05/27/15 1047  PainSc: 0-No pain                 Dasia Guerrier S

## 2015-05-27 NOTE — Op Note (Signed)
Dictation Number [id number]

## 2015-05-27 NOTE — Anesthesia Procedure Notes (Addendum)
Anesthesia Regional Block:  Bier block (IV Regional)  Pre-Anesthetic Checklist: ,, timeout performed, Correct Patient, Correct Site, Correct Laterality, Correct Procedure,, site marked, surgical consent,, at surgeon's request Needles:  Injection technique: Single-shot  Needle Type: Other      Needle Gauge: 20 and 20 G    Additional Needles: Bier block (IV Regional) Narrative:   Performed by: Personally    Procedure Name: MAC Date/Time: 05/27/2015 9:55 AM Performed by: Burke Terry D Pre-anesthesia Checklist: Patient identified, Timeout performed, Emergency Drugs available, Suction available and Patient being monitored Patient Re-evaluated:Patient Re-evaluated prior to inductionOxygen Delivery Method: Simple face mask

## 2015-05-28 ENCOUNTER — Encounter (HOSPITAL_BASED_OUTPATIENT_CLINIC_OR_DEPARTMENT_OTHER): Payer: Self-pay | Admitting: Orthopedic Surgery

## 2015-05-28 NOTE — Op Note (Signed)
NAMMargie Billet:  Rzasa, Jonnelle                  ACCOUNT NO.:  [id number]  MEDICAL RECORD NO.:  [id number]  LOCATION:                                 FACILITY:  PHYSICIAN:  Cindee SaltGary Tehya Leath, M.D.            DATE OF BIRTH:  DATE OF PROCEDURE:  05/27/2015 DATE OF DISCHARGE:                              OPERATIVE REPORT   PREOPERATIVE DIAGNOSIS:  Stenosing tenosynovitis, left thumb.  POSTOPERATIVE DIAGNOSIS:  Stenosing tenosynovitis, left thumb.  OPERATION:  Release of A1 pulley, left thumb.  SURGEON:  Cindee SaltGary Margret Moat, M.D.  ANESTHESIA:  Forearm-based IV regional with local infiltration.  ANESTHESIOLOGIST:  Hezzie BumpGeorge S. Okey Dupreose, M.D.  HISTORY:  The patient is a 64 year old female with history of triggering of her left thumb, this is not responded to conservative treatment.  She has elected to undergo surgical release of the A1 pulley.  Pre, peri, and postoperative course have been discussed along with risks and complications.  She is aware that there was no guarantee with the surgery; possibility of infection; recurrence of injury to arteries, nerves, tendons; incomplete relief of symptoms; dystrophy.  In the preoperative area, the patient is seen, the extremity marked by both patient and surgeon.  Antibiotic given.  DESCRIPTION OF PROCEDURE:  The patient was brought to the operating room, where a forearm-based IV regional anesthetic was carried out without difficulty.  She was prepped using ChloraPrep, supine position with the left arm free.  A 3-minute dry time was allowed.  Time-out taken, confirming the patient and procedure.  A transverse incision was made over the A1 pulley of the left thumb, carried down through the subcutaneous tissue.  Bleeders were electrocauterized with bipolar. Neurovascular structures were identified and protected with retractors. The A1 pulley was identified.  This was released on its radial aspect. The oblique pulley was left intact.  The proximal synovial tissue  was separated with blunt dissection.  The thumb placed through a full range motion, no further triggering was noted.  Area of compression to the tendon was immediately apparent.  The wound was copiously irrigated with saline and the skin closed with interrupted 4-0 nylon sutures.  The local infiltration with 0.25% bupivacaine without epinephrine was given, approximately 5 mL was used.  A sterile compressive dressing with fingers and thumb free was applied.  On deflation of the tourniquet, all fingers were immediately pinked.  She was taken to the recovery room for observation in satisfactory condition.  She will be discharged to home [facility], on Tylenol with Codeine.          ______________________________ Cindee SaltGary Meliya Mcconahy, M.D.     GK/MEDQ  D:  05/27/2015  T:  05/28/2015  Job:  [id number]

## 2015-06-05 ENCOUNTER — Other Ambulatory Visit: Payer: Self-pay | Admitting: Internal Medicine

## 2015-07-28 ENCOUNTER — Other Ambulatory Visit: Payer: Self-pay

## 2015-07-28 DIAGNOSIS — Z1231 Encounter for screening mammogram for malignant neoplasm of breast: Secondary | ICD-10-CM

## 2015-08-13 ENCOUNTER — Ambulatory Visit
Admission: RE | Admit: 2015-08-13 | Discharge: 2015-08-13 | Disposition: A | Discharge status: Home / Self Care | Payer: 59 | Source: Ambulatory Visit

## 2015-08-13 DIAGNOSIS — Z1231 Encounter for screening mammogram for malignant neoplasm of breast: Secondary | ICD-10-CM

## 2015-09-30 ENCOUNTER — Other Ambulatory Visit (INDEPENDENT_AMBULATORY_CARE_PROVIDER_SITE_OTHER): Payer: 59

## 2015-09-30 DIAGNOSIS — I10 Essential (primary) hypertension: Secondary | ICD-10-CM

## 2015-09-30 DIAGNOSIS — E119 Type 2 diabetes mellitus without complications: Secondary | ICD-10-CM

## 2015-09-30 LAB — BASIC METABOLIC PANEL
BUN: 16 mg/dL (ref 6–23)
CALCIUM: 9.4 mg/dL (ref 8.4–10.5)
CO2: 32 mEq/L (ref 19–32)
Chloride: 102 mEq/L (ref 96–112)
Creatinine, Ser: 0.83 mg/dL (ref 0.40–1.20)
GFR: 73.48 mL/min (ref >60.00)
GLUCOSE: 99 mg/dL (ref 70–99)
POTASSIUM: 3.6 meq/L (ref 3.5–5.1)
Sodium: 141 mEq/L (ref 135–145)

## 2015-09-30 LAB — HEMOGLOBIN A1C: Hgb A1c MFr Bld: 5.9 % (ref 4.6–6.5)

## 2015-10-01 ENCOUNTER — Ambulatory Visit: Payer: 59 | Admitting: Pulmonary Disease

## 2015-10-07 ENCOUNTER — Other Ambulatory Visit: Payer: Self-pay | Admitting: Internal Medicine

## 2015-10-07 ENCOUNTER — Ambulatory Visit: Payer: 59 | Admitting: Internal Medicine

## 2015-10-08 NOTE — Telephone Encounter (Signed)
Called to the pharmacy and left on machine. 

## 2015-10-08 NOTE — Telephone Encounter (Signed)
Ok x 2  

## 2015-10-13 NOTE — Progress Notes (Signed)
Pre visit review using our clinic review tool, if applicable. No additional management support is needed unless otherwise documented below in the visit note.  Chief Complaint  Patient presents with  . Follow-up    HPI: Rachel Blair 65 y.o. 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 His aware of potential side effects. She's tried to work on blood sugar effect. However does like her sweets tea.  No change in health otherwise no chest pain shortness of breath. ROS: See pertinent positives and negatives per HPI.  Past Medical History  Diagnosis Date  . Chest pain     atypical  . Hypertension   . Allergy   . Nasal septal deviation   . Odontogenic cyst     of left maxillary sinus  . History of pneumonia   . CHEST DISCOMFORT 08/30/2007    Qualifier: Diagnosis of  By: Fabian SharpPanosh MD, Neta MendsWanda K   . OSA (obstructive sleep apnea)     moderatley severe, uses CPAP nightly  . OBSTRUCTIVE SLEEP APNEA 08/30/2007    Qualifier: Diagnosis of  By: Fabian SharpPanosh MD, Neta MendsWanda K   . Arthritis   . Trigger thumb of left hand     Family History  Problem Relation Age of Onset  . Heart disease Mother   . Stroke Father   . Heart failure Father   . Emphysema Father   . Snoring Brother   . Lung cancer Brother   . Rheum arthritis Daughter     Social History   Social History  . Marital Status: Married    Spouse Name: N/A  . Number of Children: 3  . Years of Education: N/A   Occupational History  . retired    Social History Main Topics  . Smoking status: Former Smoker -- 1.00 packs/day for 30 years    Types: Cigarettes    Quit date: 06/14/1998  . Smokeless tobacco: Never Used  . Alcohol Use: Yes     Comment: once a month  . Drug Use: No  . Sexual Activity: Yes    Birth Control/ Protection: None   Other Topics Concern  . 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 hydrochlorothiazide (HYDRODIURIL) 25 MG tablet TAKE 1 TABLET BY MOUTH DAILY 90 tablet 0  . LORazepam (ATIVAN) 0.5 MG tablet TAKE 1-2 TABLETS BY MOUTH BEFORE FLIGHT AS NEEDED AND AS DIRECTED Do not take with ambien 6 tablet 0  . zolpidem (AMBIEN) 10 MG tablet TAKE 1 TABLET BY MOUTH EVERY DAY AT BEDTIME AS NEEDED FOR SLEEP 30 tablet 1  . acetaminophen-codeine (TYLENOL #3) 300-30 MG tablet Take 1 tablet by mouth every 4 (four) hours as needed for moderate pain. 20 tablet 0  . hydrochlorothiazide (HYDRODIURIL) 25 MG tablet TAKE 1 TABLET BY MOUTH DAILY 90 tablet 0   No facility-administered medications prior  to visit.     EXAM:  BP 120/70 mmHg  Temp(Src) 98.3 F (36.8 C) (Oral)  Wt 230 lb 14.4 oz (104.736 kg)  Body mass index is 39.61 kg/(m^2).  GENERAL: vitals reviewed and listed above, alert, oriented, appears well hydrated and in no acute distress HEENT: atraumatic, conjunctiva  clear, no obvious abnormalities on inspection of external nose and ears MS: moves all extremities without noticeable focal  abnormality PSYCH: pleasant and cooperative, no obvious depression or anxiety Lab Results  Component Value Date   WBC 6.3 03/31/2015   HGB 14.9 03/31/2015   HCT 43.8 03/31/2015   PLT 228.0 03/31/2015   GLUCOSE 99 09/30/2015   CHOL 176 03/31/2015   TRIG 161.0* 03/31/2015   HDL 47.60 03/31/2015   LDLDIRECT 121.8 02/26/2011   LDLCALC 96 03/31/2015   ALT 22 03/31/2015   AST 15 03/31/2015   NA 141 09/30/2015   K 3.6 09/30/2015   CL 102 09/30/2015   CREATININE 0.83 09/30/2015   BUN 16 09/30/2015   CO2 32 09/30/2015   TSH 1.65 03/31/2015   HGBA1C  5.9 09/30/2015   BP Readings from Last 3 Encounters:  10/14/15 120/70  05/27/15 138/78  04/08/15 130/80   Wt Readings from Last 3 Encounters:  10/14/15 230 lb 14.4 oz (104.736 kg)  05/27/15 230 lb 4 oz (104.441 kg)  04/08/15 229 lb 12.8 oz (104.237 kg)     ASSESSMENT AND PLAN:  Discussed the following assessment and plan:  Hyperglycemia  Medication management  Benign hypertensive heart disease without heart failure  Complex sleep apnea syndrome  Insomnia Reviewed risk benefit of medication especially the Ambien will refill at this time we have tried a number of other options and is long as she is adequately treated for her sleep apnea on not as concerned about as much risk. Will refill just 6 pills of her lorazepam for when she travels and not to take it the same time his other medications. Blood pressure appears to be controlled. Plan follow-up in 6 months can do an A1c at that time. At this time although offered nutrition referral she will plan her own lifestyle intervention discussed strategies. Total visit > 50% spent counseling and coordinating care as indicated in above note and in instructions to patient .  Tox screen today  -Patient advised to return or notify health care team  if symptoms worsen ,persist or new concerns arise.  Patient Instructions  Blood sugar is better   Continue limit processed carbs   Stay on the bipap continuing .  Continued  caution with   Sandria Senter K. Laren Whaling M.D.

## 2015-10-14 ENCOUNTER — Encounter: Payer: Self-pay | Admitting: Internal Medicine

## 2015-10-14 ENCOUNTER — Ambulatory Visit (INDEPENDENT_AMBULATORY_CARE_PROVIDER_SITE_OTHER): Payer: 59 | Admitting: Internal Medicine

## 2015-10-14 VITALS — BP 120/70 | Temp 98.3°F | Wt 230.9 lb

## 2015-10-14 DIAGNOSIS — R739 Hyperglycemia, unspecified: Secondary | ICD-10-CM

## 2015-10-14 DIAGNOSIS — I119 Hypertensive heart disease without heart failure: Secondary | ICD-10-CM | POA: Diagnosis not present

## 2015-10-14 DIAGNOSIS — G4737 Central sleep apnea in conditions classified elsewhere: Secondary | ICD-10-CM

## 2015-10-14 DIAGNOSIS — G4733 Obstructive sleep apnea (adult) (pediatric): Secondary | ICD-10-CM | POA: Diagnosis not present

## 2015-10-14 DIAGNOSIS — G4731 Primary central sleep apnea: Secondary | ICD-10-CM

## 2015-10-14 DIAGNOSIS — Z79899 Other long term (current) drug therapy: Secondary | ICD-10-CM

## 2015-10-14 DIAGNOSIS — G4700 Insomnia, unspecified: Secondary | ICD-10-CM

## 2015-10-14 MED ORDER — LORAZEPAM 0.5 MG PO TABS: ORAL_TABLET | ORAL | Status: DC

## 2015-10-14 MED ORDER — ZOLPIDEM TARTRATE 10 MG PO TABS: ORAL_TABLET | ORAL | Status: DC

## 2015-10-14 NOTE — Patient Instructions (Signed)
Blood sugar is better   Continue limit processed carbs   Stay on the bipap continuing .  Continued  caution with   Remus Lofflerambien

## 2015-10-27 ENCOUNTER — Encounter: Payer: Self-pay | Admitting: Internal Medicine

## 2015-11-05 ENCOUNTER — Other Ambulatory Visit: Payer: Self-pay | Admitting: Internal Medicine

## 2015-11-05 NOTE — Telephone Encounter (Signed)
Last OV 10/14/15... Last refill 06/05/15, #90 with 0 refills... Okay to refill?

## 2015-11-05 NOTE — Telephone Encounter (Signed)
Can  refill through April 2017

## 2015-11-25 ENCOUNTER — Ambulatory Visit (INDEPENDENT_AMBULATORY_CARE_PROVIDER_SITE_OTHER): Payer: 59 | Admitting: Pulmonary Disease

## 2015-11-25 ENCOUNTER — Encounter: Payer: Self-pay | Admitting: Pulmonary Disease

## 2015-11-25 VITALS — BP 118/80 | HR 84 | Ht 64.0 in | Wt 229.0 lb

## 2015-11-25 DIAGNOSIS — G4700 Insomnia, unspecified: Secondary | ICD-10-CM | POA: Diagnosis not present

## 2015-11-25 DIAGNOSIS — G4733 Obstructive sleep apnea (adult) (pediatric): Secondary | ICD-10-CM

## 2015-11-25 DIAGNOSIS — G4737 Central sleep apnea in conditions classified elsewhere: Secondary | ICD-10-CM

## 2015-11-25 DIAGNOSIS — G4731 Primary central sleep apnea: Secondary | ICD-10-CM

## 2015-11-25 NOTE — Progress Notes (Signed)
Subjective:    Patient ID: Rachel Blair, female    DOB: 1950-11-06, 65 y.o.   MRN: [id number]  HPI Former [facility] pt, For FU of severe OSA  11/25/2015  Chief Complaint  Patient presents with  . Sleep Apnea    Currently using ASV device every night for about 7 hours. Denies issues with machine, mask or pressure. DME is [facility].   She was placed on ASV device-and this has been very effective for her compared to CPAP She sleeps well through the night, few nocturnal awakenings, wakes up feeling rested, no daytime drowsiness or naps  Uses nasal mask, pr ok, no dryness  Her weight  is mostly unchanged  Download 10/2015 >> on ASV - no residuals, mild leak , usage > 7h  HST 05/2014:  [facility] 68/hr ASV titration study 07/2014:  Optimal pressure >>  PS 3-15, EPAP 7   Review of Systems Patient denies significant dyspnea,cough, hemoptysis,  chest pain, palpitations, pedal edema, orthopnea, paroxysmal nocturnal dyspnea, lightheadedness, nausea, vomiting, abdominal or  leg pains      Objective:   Physical Exam  Gen. Pleasant, obese, in no distress ENT - no lesions, no post nasal drip Neck: No JVD, no thyromegaly, no carotid bruits Lungs: no use of accessory muscles, no dullness to percussion, decreased without rales or rhonchi  Cardiovascular: Rhythm regular, heart sounds  normal, no murmurs or gallops, no peripheral edema Musculoskeletal: No deformities, no cyanosis or clubbing , no tremors       Assessment & Plan:

## 2015-11-25 NOTE — Assessment & Plan Note (Signed)
ASV machine is working Educational psychologistbeautifully Supplies will be renewed for 1 year  Weight loss encouraged, compliance with goal of at least 4-6 hrs every night is the expectation. Advised against medications with sedative side effects Cautioned against driving when sleepy - understanding that sleepiness will vary on a day to day basis

## 2015-11-25 NOTE — Patient Instructions (Addendum)
ASV machine is working [employer] psychologistbeautifully Supplies will be renewed for 1 year

## 2015-11-25 NOTE — Assessment & Plan Note (Signed)
Simple rules of sleep hygiene discussed

## 2016-03-05 ENCOUNTER — Other Ambulatory Visit: Payer: Self-pay | Admitting: Internal Medicine

## 2016-03-05 NOTE — Telephone Encounter (Signed)
Ok to refill x 1  

## 2016-03-08 NOTE — Telephone Encounter (Signed)
Called to the pharmacy and left on machine. 

## 2016-05-17 ENCOUNTER — Other Ambulatory Visit: Payer: Self-pay | Admitting: Internal Medicine

## 2016-05-18 ENCOUNTER — Telehealth: Payer: Self-pay | Admitting: Family Medicine

## 2016-05-18 NOTE — Telephone Encounter (Signed)
[facility]   Please help the pt to make an appointment.  Thanks!!

## 2016-05-18 NOTE — Telephone Encounter (Signed)
Ok x 1 refill ( 90 days)  Ov before more refills

## 2016-05-18 NOTE — Telephone Encounter (Signed)
Called to the pharmacy and left on machine.  Message sent to scheduling.

## 2016-05-19 NOTE — Telephone Encounter (Signed)
Pt has been sch

## 2016-08-17 ENCOUNTER — Ambulatory Visit (INDEPENDENT_AMBULATORY_CARE_PROVIDER_SITE_OTHER): Payer: Medicare Other | Admitting: Internal Medicine

## 2016-08-17 ENCOUNTER — Encounter: Payer: Self-pay | Admitting: Internal Medicine

## 2016-08-17 VITALS — BP 128/70 | HR 84 | Temp 98.3°F | Ht 64.0 in | Wt 229.2 lb

## 2016-08-17 DIAGNOSIS — R7301 Impaired fasting glucose: Secondary | ICD-10-CM | POA: Diagnosis not present

## 2016-08-17 DIAGNOSIS — R350 Frequency of micturition: Secondary | ICD-10-CM

## 2016-08-17 DIAGNOSIS — G4731 Primary central sleep apnea: Secondary | ICD-10-CM

## 2016-08-17 DIAGNOSIS — I10 Essential (primary) hypertension: Secondary | ICD-10-CM | POA: Diagnosis not present

## 2016-08-17 DIAGNOSIS — J069 Acute upper respiratory infection, unspecified: Secondary | ICD-10-CM

## 2016-08-17 DIAGNOSIS — N390 Urinary tract infection, site not specified: Secondary | ICD-10-CM

## 2016-08-17 DIAGNOSIS — Z79899 Other long term (current) drug therapy: Secondary | ICD-10-CM

## 2016-08-17 DIAGNOSIS — G4700 Insomnia, unspecified: Secondary | ICD-10-CM

## 2016-08-17 LAB — POC URINALSYSI DIPSTICK (AUTOMATED)
BILIRUBIN UA: NEGATIVE
Glucose, UA: NEGATIVE
KETONES UA: NEGATIVE
Leukocytes, UA: NEGATIVE
NITRITE UA: NEGATIVE
PH UA: 6.5
Protein, UA: NEGATIVE
RBC UA: NEGATIVE
SPEC GRAV UA: 1.015
Urobilinogen, UA: 0.2

## 2016-08-17 LAB — POCT GLYCOSYLATED HEMOGLOBIN (HGB A1C): Hemoglobin A1C: 5.5

## 2016-08-17 MED ORDER — CEFUROXIME AXETIL 500 MG PO TABS: 500.0000 mg | ORAL_TABLET | Freq: Two times a day (BID) | ORAL | 0 refills | Status: DC

## 2016-08-17 MED ORDER — ZOLPIDEM TARTRATE 10 MG PO TABS: ORAL_TABLET | ORAL | 1 refills | Status: DC

## 2016-08-17 NOTE — Progress Notes (Signed)
Chief Complaint  Patient presents with  . Follow-up    also may have a uti    HPI: Rachel Blair 66 y.o. come in for Chronic disease management   Delayed for fu last seen may 17  But under care for complex sleep apnea  6 17  Following hyperglycemia   She's had 2 week history of respiratory symptoms with coughing upper chest nasal drainage and face congestion doesn't seem to be improving. There is no fever shortness of breath. She is taking Mucinex DM question other.  Also has had 4-6  week or so of increased urinary frequency bad odor and dysuria. The specimen however she left today is a lot clearer when in the office. There is been no hematuria she does have a history of UTIs.  Under treatment for sleep apnea using BiPAP doing well  Still needs to use Ambien uses it every night. Patient states that she denies any untoward side effects hard to sleep without it.  Take loraze  travel. ROS: See pertinent positives and negatives per HPI. No syncope unusual headaches hemoptysis or shortness of breath. Not smoking.  Past Medical History:  Diagnosis Date  . Allergy   . Arthritis   . CHEST DISCOMFORT 08/30/2007   Qualifier: Diagnosis of  By: Fabian Sharp MD, Neta Mends   . Chest pain    atypical  . History of pneumonia   . Hypertension   . Nasal septal deviation   . OBSTRUCTIVE SLEEP APNEA 08/30/2007   Qualifier: Diagnosis of  By: Fabian Sharp MD, Neta Mends   . Odontogenic cyst    of left maxillary sinus  . OSA (obstructive sleep apnea)    moderatley severe, uses CPAP nightly  . Trigger thumb of left hand     Family History  Problem Relation Age of Onset  . Heart disease Mother   . Stroke Father   . Heart failure Father   . Emphysema Father   . Snoring Brother   . Lung cancer Brother   . Rheum arthritis Daughter     Social History   Social History  . Marital status: Married    Spouse name: N/A  . Number of children: 3  . Years of education: N/A   Occupational History  . retired      Social History Main Topics  . Smoking status: Former Smoker    Packs/day: 1.00    Years: 30.00    Types: Cigarettes    Quit date: 06/14/1998  . Smokeless tobacco: Never Used  . Alcohol use Yes     Comment: once a month  . Drug use: No  . Sexual activity: Yes    Birth control/ protection: None   Other Topics Concern  . 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 hydrochlorothiazide (HYDRODIURIL) 25 MG tablet TAKE 1 TABLET BY MOUTH DAILY 90 tablet 3  . LORazepam (ATIVAN) 0.5 MG tablet TAKE 1 TO 2 TABLETS BY MOUTH BEFORE FLIGHT AS NEEDED AND AS DIRECTED. DO NOT TAKE WITH AMBIEN 6  tablet 0  . zolpidem (AMBIEN) 10 MG tablet TAKE 1 TABLET BY MOUTH EVERY DAY AT BEDTIME AS NEEDED FOR SLEEP 90 tablet 0   No facility-administered medications prior to visit.      EXAM:  BP 128/70   Pulse 84   Temp 98.3 F (36.8 C) (Oral)   Ht 5\' 4"  (1.626 m)   Wt 229 lb 3.2 oz (104 kg)   BMI 39.34 kg/m   Body mass index is 39.34 kg/m.  GENERAL: vitals reviewed and listed above, alert, oriented, appears well hydrated and in no acute distressCongested with minor cough. HEENT: atraumatic, conjunctiva  clear, no obvious abnormalities on inspection of external nose and ears TMs show no acute findings nares congested face minimally tender bilateral maxillary area OP : no lesion edema or exudate  NECK: no obvious masses on inspection palpation  LUNGS: clear to auscultation bilaterally, no wheezes, rales or rhonchi, good air movement CV: HRRR, no clubbing cyanosis or  peripheral edema nl cap refill  MS: moves all extremities without noticeable focal  abnormality PSYCH: pleasant and cooperative, no  obvious depression or anxiety Lab Results  Component Value Date   WBC 6.3 03/31/2015   HGB 14.9 03/31/2015   HCT 43.8 03/31/2015   PLT 228.0 03/31/2015   GLUCOSE 99 09/30/2015   CHOL 176 03/31/2015   TRIG 161.0 (H) 03/31/2015   HDL 47.60 03/31/2015   LDLDIRECT 121.8 02/26/2011   LDLCALC 96 03/31/2015   ALT 22 03/31/2015   AST 15 03/31/2015   NA 141 09/30/2015   K 3.6 09/30/2015   CL 102 09/30/2015   CREATININE 0.83 09/30/2015   BUN 16 09/30/2015   CO2 32 09/30/2015   TSH 1.65 03/31/2015   HGBA1C 5.5 08/17/2016   BP Readings from Last 3 Encounters:  08/17/16 128/70  11/25/15 118/80  10/14/15 120/70   Wt Readings from Last 3 Encounters:  08/17/16 229 lb 3.2 oz (104 kg)  11/25/15 229 lb (103.9 kg)  10/14/15 230 lb 14.4 oz (104.7 kg)   UA dipstick is clear see above patient reports the specimen is clear than previous. ASSESSMENT AND PLAN:  Discussed the following assessment and plan:  Protracted upper respiratory infection new - Probable sinusitis bacterial Flonase can add antibiotic that may cover urinary germs gram-negative's with her history of recurrent UTI of the UA is clear today  Medication management - Discussed risk-benefit of Ambien and continue at this point at some point may need to try [place] was expensive last time she checked  Fasting hyperglycemia - nl aic - Plan: POC HgB A1c  Essential hypertension - controlled  Complex sleep apnea syndrome  Urinary frequency new  Urinary tract infection without hematuria, site unspecified - Plan: POCT Urinalysis Dipstick (Automated), Urine culture, Urine culture  Insomnia, unspecified type - dsic risk benef[facility] of med  refill today ok  Off schedule and due for monitoring labs and check up    But now on medicare so will have to be welcome to medicare   ?  June ?  -Patient advised to return or notify health care team  if  new concerns arise.  Patient Instructions  Continued caution with Ambien as  discussed. We'll send for urine culture because of your past history of UTIs and symptoms. Can begin antibiotic for presumed sinusitis and UTI. We can add Flonase to your sinus treatment. Expect improvement in both areas in the next 5 days or less. Follow-up if persistent progressive. Please make appointment for a welcome  to Medicare visit in about in June  Or thereabouts months.    Neta Mends. Dalbert Stillings M.D.

## 2016-08-17 NOTE — Patient Instructions (Signed)
Continued caution with Ambien as discussed. We'll send for urine culture because of your past history of UTIs and symptoms. Can begin antibiotic for presumed sinusitis and UTI. We can add Flonase to your sinus treatment. Expect improvement in both areas in the next 5 days or less. Follow-up if persistent progressive. Please make appointment for a welcome to Medicare visit in about in June  Or thereabouts months.

## 2016-08-20 ENCOUNTER — Telehealth: Payer: Self-pay

## 2016-08-20 LAB — URINE CULTURE

## 2016-08-20 NOTE — Telephone Encounter (Signed)
[facility]. PA submitted & is pending. Key: [id number]

## 2016-08-20 NOTE — Telephone Encounter (Signed)
PA approved, form faxed back to pharmacy. 

## 2016-08-25 ENCOUNTER — Telehealth: Payer: Self-pay | Admitting: Internal Medicine

## 2016-08-25 NOTE — Telephone Encounter (Signed)
I Advise use otc  Miconazole 3 or 7    ( works as well as creams  But   If not working or cant do that then can send in diflucan 150 mg 1 take 1 po x 1

## 2016-08-25 NOTE — Telephone Encounter (Signed)
Please advise 

## 2016-08-25 NOTE — Telephone Encounter (Signed)
Pt state that she was taking an antibiotic cefuroxime and has completed it on 3/13 and now she state that she has a yeast infection and need something for it.  Pt denies appt, but state she will come in if Dr. Fabian SharpPanosh would like for her to.   Pharm:  [facility] on [address].

## 2016-08-26 MED ORDER — FLUCONAZOLE 150 MG PO TABS: 150.0000 mg | ORAL_TABLET | Freq: Once | ORAL | 0 refills | Status: AC

## 2016-08-26 NOTE — Addendum Note (Signed)
Addended by: Dominic PeaHOLSEY, Ormand Senn V. on: 08/26/2016 01:29 PM   Modules accepted: Orders

## 2016-08-26 NOTE — Telephone Encounter (Signed)
Spoke with pt and gave her Dr.Panosh recommendations on using the OTC cream or the Diflucan. Pt states she already use something over the counter and it didn't give her any relief so she wants to try the Diflucan.

## 2016-11-02 ENCOUNTER — Other Ambulatory Visit: Payer: Self-pay | Admitting: Internal Medicine

## 2016-11-09 ENCOUNTER — Telehealth: Payer: Self-pay | Admitting: Pulmonary Disease

## 2016-11-09 NOTE — Telephone Encounter (Signed)
Spoke with patient regarding CPAP supplies. [facility] as well as today.    Synetta Failnita, do you have any CMN forms on her? Thanks!

## 2016-11-10 NOTE — Telephone Encounter (Signed)
I don't have anything for this patient and I don't think Dr. Vassie LollAlva will sign because she is due to be seen in 11/2016 and there is a recall but she doesn't have an appt made yet.

## 2016-11-15 NOTE — Telephone Encounter (Signed)
Will hold message in triage until CMN is received.

## 2016-11-15 NOTE — Telephone Encounter (Signed)
Barbara CowerJason called back advised CMN refaxed - He can be reached at [phone]

## 2016-11-15 NOTE — Telephone Encounter (Signed)
lmtcb x1 [facility] [place] [facility]. Will need CMN refax, as Synetta Failnita has not received CMN.

## 2016-11-15 NOTE — Telephone Encounter (Signed)
I haved checked all the CMN's that I have and I don't see one for this patient

## 2016-11-16 NOTE — Telephone Encounter (Signed)
Left a message with Barbara CowerJason to check on status of CMN.

## 2016-11-17 NOTE — Telephone Encounter (Signed)
Called and spoke with pt and she is aware of appt with TP in July.  Nothing further is needed.

## 2016-11-17 NOTE — Telephone Encounter (Signed)
We did get this CMN but I gave it back to [place], [facility] stating the patient was last seen 11/25/2015 and a recall is in for 11/24/2016 and she needs to be seen before the form can be signed

## 2016-11-22 ENCOUNTER — Encounter: Payer: Self-pay | Admitting: Internal Medicine

## 2016-11-22 ENCOUNTER — Ambulatory Visit (INDEPENDENT_AMBULATORY_CARE_PROVIDER_SITE_OTHER): Payer: Medicare Other | Admitting: Internal Medicine

## 2016-11-22 VITALS — BP 110/70 | HR 78 | Temp 98.3°F | Ht 64.0 in | Wt 230.6 lb

## 2016-11-22 DIAGNOSIS — G4731 Primary central sleep apnea: Secondary | ICD-10-CM | POA: Diagnosis not present

## 2016-11-22 DIAGNOSIS — E2839 Other primary ovarian failure: Secondary | ICD-10-CM

## 2016-11-22 DIAGNOSIS — Z79899 Other long term (current) drug therapy: Secondary | ICD-10-CM | POA: Diagnosis not present

## 2016-11-22 DIAGNOSIS — R7301 Impaired fasting glucose: Secondary | ICD-10-CM

## 2016-11-22 DIAGNOSIS — G4700 Insomnia, unspecified: Secondary | ICD-10-CM

## 2016-11-22 DIAGNOSIS — Z0000 Encounter for general adult medical examination without abnormal findings: Secondary | ICD-10-CM | POA: Diagnosis not present

## 2016-11-22 DIAGNOSIS — Z23 Encounter for immunization: Secondary | ICD-10-CM

## 2016-11-22 DIAGNOSIS — Z9289 Personal history of other medical treatment: Secondary | ICD-10-CM

## 2016-11-22 DIAGNOSIS — I10 Essential (primary) hypertension: Secondary | ICD-10-CM | POA: Diagnosis not present

## 2016-11-22 DIAGNOSIS — Z729 Problem related to lifestyle, unspecified: Secondary | ICD-10-CM

## 2016-11-22 LAB — CBC WITH DIFFERENTIAL/PLATELET
BASOS ABS: 0 10*3/uL (ref 0.0–0.1)
Basophils Relative: 0.6 % (ref 0.0–3.0)
EOS ABS: 0.1 10*3/uL (ref 0.0–0.7)
Eosinophils Relative: 2.1 % (ref 0.0–5.0)
HEMATOCRIT: 45.6 % (ref 36.0–46.0)
HEMOGLOBIN: 15.8 g/dL — AB (ref 12.0–15.0)
LYMPHS PCT: 35.8 % (ref 12.0–46.0)
Lymphs Abs: 2.5 10*3/uL (ref 0.7–4.0)
MCHC: 34.8 g/dL (ref 30.0–36.0)
MCV: 88.5 fl (ref 78.0–100.0)
MONOS PCT: 9.3 % (ref 3.0–12.0)
Monocytes Absolute: 0.7 10*3/uL (ref 0.1–1.0)
Neutro Abs: 3.7 10*3/uL (ref 1.4–7.7)
Neutrophils Relative %: 52.2 % (ref 43.0–77.0)
Platelets: 254 10*3/uL (ref 150.0–400.0)
RBC: 5.15 Mil/uL — AB (ref 3.87–5.11)
RDW: 13.8 % (ref 11.5–15.5)
WBC: 7.1 10*3/uL (ref 4.0–10.5)

## 2016-11-22 LAB — HEPATIC FUNCTION PANEL
ALT: 19 U/L (ref 0–35)
AST: 14 U/L (ref 0–37)
Albumin: 4.2 g/dL (ref 3.5–5.2)
Alkaline Phosphatase: 61 U/L (ref 39–117)
BILIRUBIN DIRECT: 0.1 mg/dL (ref 0.0–0.3)
BILIRUBIN TOTAL: 0.8 mg/dL (ref 0.2–1.2)
TOTAL PROTEIN: 6.4 g/dL (ref 6.0–8.3)

## 2016-11-22 LAB — TSH: TSH: 1.37 u[IU]/mL (ref 0.35–4.50)

## 2016-11-22 LAB — BASIC METABOLIC PANEL
BUN: 15 mg/dL (ref 6–23)
CALCIUM: 9.3 mg/dL (ref 8.4–10.5)
CHLORIDE: 103 meq/L (ref 96–112)
CO2: 28 meq/L (ref 19–32)
CREATININE: 0.83 mg/dL (ref 0.40–1.20)
GFR: 73.22 mL/min (ref >60.00)
Glucose, Bld: 79 mg/dL (ref 70–99)
Potassium: 3.6 mEq/L (ref 3.5–5.1)
SODIUM: 139 meq/L (ref 135–145)

## 2016-11-22 LAB — LIPID PANEL
CHOL/HDL RATIO: 4
Cholesterol: 184 mg/dL (ref 0–200)
HDL: 48 mg/dL (ref >39.00)
LDL CALC: 97 mg/dL (ref 0–99)
NONHDL: 135.67
TRIGLYCERIDES: 192 mg/dL — AB (ref 0.0–149.0)
VLDL: 38.4 mg/dL (ref 0.0–40.0)

## 2016-11-22 LAB — HEMOGLOBIN A1C: Hgb A1c MFr Bld: 5.9 % (ref 4.6–6.5)

## 2016-11-22 MED ORDER — LORAZEPAM 0.5 MG PO TABS: ORAL_TABLET | ORAL | 0 refills | Status: DC

## 2016-11-22 NOTE — Progress Notes (Signed)
Chief Complaint  Patient presents with  . Medicare Wellness    HPI: 35 Rachel Blair 66 y.o. comes in today for welcome to Medicare wellness visit  And Chronic disease management HT no se of med controlled OSA on cpap per ALva but takes ambien 10   Night and cant sleep without  Uses loraz only when travels . Needs renewal to travel . Has donated blood in recnet past  Health Maintenance  Topic Date Due  . Hepatitis C Screening  1951/04/29  . FOOT EXAM  05/24/1961  . OPHTHALMOLOGY EXAM  05/24/1961  . URINE MICROALBUMIN  05/24/1961  . DEXA SCAN  05/24/2016  . PNA vac Low Risk Adult (1 of 2 - PCV13) 05/24/2016  . HIV Screening  11/22/2017 (Originally 05/24/1966)  . INFLUENZA VACCINE  01/12/2017  . HEMOGLOBIN A1C  02/17/2017  . PAP SMEAR  02/25/2017  . MAMMOGRAM  08/12/2017  . TETANUS/TDAP  06/09/2020  . COLONOSCOPY  02/23/2021   Health Maintenance Review LIFESTYLE:  Exercise:  Yard work  Tobacco/ETS: no this point  Alcohol:   Once a blu moon  Sugar beverages: most days teas  Sleep: cpap  About  8 hours   Drug use: no hh of 2   A dog  Keeps a grand dchild  2 year  ambien 10 mg  Per night  Ativan for flying         Hearing: ok  Vision:  No limitations at present . Last eye check UTD  Safety:  Has smoke detector and wears seat belts. . No excess sun exposure. Sees dentist regularly.  Falls: n  Advance directive :  Reviewed  Not having one  Hand out given  Memory: Felt to be good  , no concern from her or her family.  Depression: No anhedonia unusual crying or depressive symptoms  Nutrition: Eats well balanced diet; adequate calcium and vitamin D. No swallowing chewing problems.  Injury: no major injuries in the last six months.  Other healthcare providers:  Reviewed today .  Social:  Lives with spouse married.cares for 2 yo grandchild  Son has meds depress anxiety and chornic pain from CONG club foot No pets.   Preventive parameters: up-to-date  Reviewed    ADLS:   There are no problems or need for assistance  driving, feeding, obtaining food, dressing, toileting and bathing, managing money using phone. She is independent.    ROS:  GEN/ HEENT: No fever, significant weight changes sweats headaches vision problems hearing changes, CV/ PULM; No chest pain shortness of breath cough, syncope,edema  change in exercise tolerance. GI /GU: No adominal pain, vomiting, change in bowel habits. No blood in the stool. No significant GU symptoms. SKIN/HEME: ,no acute skin rashes suspicious lesions or bleeding. No lymphadenopathy, nodules, masses.  NEURO/ PSYCH:  No neurologic signs such as weakness numbness. No depression anxiety. IMM/ Allergy: No unusual infections.  Allergy .   REST of 12 system review negative except as per HPI   Past Medical History:  Diagnosis Date  . Allergy   . Arthritis   . CHEST DISCOMFORT 08/30/2007   Qualifier: Diagnosis of  By: Fabian Sharp MD, Rachel Blair   . Chest pain    atypical  . History of pneumonia   . Hypertension   . Nasal septal deviation   . OBSTRUCTIVE SLEEP APNEA 08/30/2007   Qualifier: Diagnosis of  By: Fabian Sharp MD, Rachel Blair   . Odontogenic cyst    of left maxillary sinus  . OSA (  obstructive sleep apnea)    moderatley severe, uses CPAP nightly  . Trigger thumb of left hand     Family History  Problem Relation Age of Onset  . Heart disease Mother   . Stroke Father   . Heart failure Father   . Emphysema Father   . Snoring Brother   . Lung cancer Brother   . Rheum arthritis Daughter     Social History   Social History  . Marital status: Married    Spouse name: N/A  . Number of children: 3  . Years of education: N/A   Occupational History  . retired    Social History Main Topics  . Smoking status: Former Smoker    Packs/day: 1.00    Years: 30.00    Types: Cigarettes    Quit date: 06/14/1998  . Smokeless tobacco: Never Used  . Alcohol use Yes     Comment: once a month  . Drug use: No  . Sexual  activity: Yes    Birth control/ protection: None   Other Topics Concern  . 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 hydrochlorothiazide (HYDRODIURIL) 25 MG tablet TAKE 1 TABLET BY MOUTH DAILY  . LORazepam (ATIVAN) 0.5 MG tablet TAKE 1 TO 2 TABLETS BY MOUTH BEFORE FLIGHT AS NEEDED AND AS DIRECTED. DO NOT TAKE WITH AMBIEN  . zolpidem (AMBIEN) 10 MG tablet TAKE 1 TABLET BY MOUTH EVERY DAY AT BEDTIME AS NEEDED FOR SLEEP  . [DISCONTINUED] LORazepam (ATIVAN) 0.5 MG tablet TAKE 1 TO 2 TABLETS BY MOUTH BEFORE FLIGHT AS NEEDED AND AS DIRECTED. DO NOT TAKE WITH AMBIEN  . [DISCONTINUED] cefUROXime (CEFTIN) 500 MG tablet Take 1 tablet (500 mg total) by mouth 2 (two) times daily.   No facility-administered encounter medications on file as of 11/22/2016.     EXAM:  BP 110/70 (BP Location: Right Arm, Patient Position: Sitting, Cuff Size: Large)   Pulse 78   Temp 98.3 F (36.8 C) (Oral)   Ht 5\' 4"  (1.626 m)   Wt 230 lb 9.6 oz (104.6 kg)   BMI 39.58 kg/m   Body mass index is 39.58 kg/m.  Physical Exam: Vital signs reviewed EXB:MWUX is a well-developed well-nourished alert cooperative   who appears stated age in no acute distress.  HEENT: normocephalic atraumatic , Eyes: PERRL EOM's full, conjunctiva clear, Nares: paten,Rachel no deformity discharge or tenderness., Ears: no deformity EAC's clear TMs with normal landmarks. Mouth: clear OP, no lesions, edema.  Moist mucous membranes. Dentition in adequate repair. NECK: supple without masses, thyromegaly or bruits. CHEST/PULM:  Clear to auscultation and percussion breath sounds equal no wheeze , rales or rhonchi. No chest wall deformities or  tenderness.Breast: normal by inspection . No dimpling, discharge, masses, tenderness or discharge . CV: PMI is nondisplaced, S1 S2 no gallops, murmurs, rubs. Peripheral pulses are full without delay.No JVD .  ABDOMEN: Bowel sounds normal nontender  No guard or rebound, no hepato splenomegal no CVA tenderness.   Extremtities:  No clubbing cyanosis or edema, no acute  joint swelling or redness no focal atrophy NEURO:  Oriented x3, cranial nerves 3-12 appear to be intact, no obvious focal weakness,gait within normal limits no abnormal reflexes or asymmetrical SKIN: No acute rashes normal turgor, color, no bruising or petechiae. PSYCH: Oriented, good eye contact, no obvious depression anxiety, cognition and judgment appear normal. LN: no cervical axillary adenopathy No noted deficits in memory, attention, and speech.   Lab Results  Component Value Date   WBC 7.1 11/22/2016   HGB 15.8 (H) 11/22/2016   HCT 45.6 11/22/2016   PLT 254.0 11/22/2016   GLUCOSE 79 11/22/2016   CHOL 184 11/22/2016   TRIG 192.0 (H) 11/22/2016   HDL 48.00 11/22/2016   LDLDIRECT 121.8 02/26/2011   LDLCALC 97 11/22/2016   ALT 19 11/22/2016   AST 14 11/22/2016   NA 139 11/22/2016   K 3.6 11/22/2016   CL 103 11/22/2016   CREATININE 0.83 11/22/2016   BUN 15 11/22/2016   CO2 28 11/22/2016   TSH 1.37 11/22/2016   HGBA1C 5.9 11/22/2016    ASSESSMENT AND PLAN:  Discussed the following assessment and plan:  Welcome to Medicare preventive visit - Plan: Basic metabolic panel, CBC with Differential/Platelet, Hemoglobin A1c, Hepatic function panel, Lipid panel, TSH  Medication management - Plan: Basic metabolic panel, CBC with Differential/Platelet, Hemoglobin A1c, Hepatic function panel, Lipid panel, TSH  Fasting hyperglycemia - Plan: Basic metabolic panel, CBC with Differential/Platelet, Hemoglobin A1c, Hepatic function panel, Lipid panel, TSH  Essential hypertension - controlled  - Plan: Basic metabolic panel, CBC  with Differential/Platelet, Hemoglobin A1c, Hepatic function panel, Lipid panel, TSH  Complex sleep apnea syndrome - Plan: Basic metabolic panel, CBC with Differential/Platelet, Hemoglobin A1c, Hepatic function panel, Lipid panel, TSH  Problem related to lifestyle - Plan: Hepatitis C antibody  Hx of transfusion - has donated blood since then - Plan: Hepatitis C antibody  Estrogen deficiency - Plan: DG Bone Density  Need for prophylactic vaccination against Streptococcus pneumoniae (pneumococcus) - Plan: Pneumococcal conjugate vaccine 13-valent IM  Insomnia, unspecified type - risk benefot discussed   .  Patient Care Team: Janautica Netzley, Rachel Mends, MD as PCP - General Cassell Clement, MD (Cardiology) Oretha Milch, MD as Consulting Physician (Pulmonary Disease)  Patient Instructions    Will notify you  of labs when available.  Caution with sleep  Meds.  Get appt for bone density . Plan rov in 6 months med check    Preventive Care 65 Years and Older, Female Preventive care refers to lifestyle choices and visits with your health care provider that can promote health and wellness. What does preventive care include?  A yearly physical exam. This is also called an annual well check.  Dental exams once or twice a year.  Routine eye exams. Ask your health care provider how often you should have your eyes checked.  Personal lifestyle choices, including: ? Daily care of your teeth and gums. ? Regular physical activity. ? Eating a healthy diet. ? Avoiding tobacco and drug use. ? Limiting alcohol use. ? Practicing safe sex. ? Taking low-dose aspirin every day. ? Taking vitamin and mineral supplements as recommended by your health care provider. What happens during an annual well check? The services and screenings done by your health care provider during your annual well check will depend on your age, overall health, lifestyle risk factors, and family history of  disease. Counseling Your health care provider may ask you questions about your:  Alcohol use.  Tobacco use.  Drug use.  Emotional well-being.  Home and relationship well-being.  Sexual activity.  Eating habits.  History of falls.  Memory and ability to understand (cognition).  Work and work Astronomer.  Reproductive health.  Screening You may have the following tests or measurements:  Height, weight, and BMI.  Blood pressure.  Lipid and cholesterol levels. These may be checked every 5 years, or more frequently if you are over 36 years old.  Skin check.  Lung cancer screening. You may have this screening every year starting at age 30 if you have a 30-pack-year history of smoking and currently smoke or have quit within the past 15 years.  Fecal occult blood test (FOBT) of the stool. You may have this test every year starting at age 66.  Flexible sigmoidoscopy or colonoscopy. You may have a sigmoidoscopy every 5 years or a colonoscopy every 10 years starting at age 74.  Hepatitis C blood test.  Hepatitis B blood test.  Sexually transmitted disease (STD) testing.  Diabetes screening. This is done by checking your blood sugar (glucose) after you have not eaten for a while (fasting). You may have this done every 1-3 years.  Bone density scan. This is done to screen for osteoporosis. You may have this done starting at age 55.  Mammogram. This may be done every 1-2 years. Talk to your health care provider about how often you should have regular mammograms.  Talk with your health care provider about your test results, treatment options, and if necessary, the need for more tests. Vaccines Your health care provider may recommend certain vaccines, such as:  Influenza vaccine. This is recommended every year.  Tetanus, diphtheria, and acellular pertussis (Tdap, Td) vaccine. You may need a Td booster every 10 years.  Varicella vaccine. You may need this if you have  not been vaccinated.  Zoster vaccine. You may need this after age 14.  Measles, mumps, and rubella (MMR) vaccine. You may need at least one dose of MMR if you were born in 1957 or later. You may also need a second dose.  Pneumococcal 13-valent conjugate (PCV13) vaccine. One dose is recommended after age 4.  Pneumococcal polysaccharide (PPSV23) vaccine. One dose is recommended after age 28.  Meningococcal vaccine. You may need this if you have certain conditions.  Hepatitis A vaccine. You may need this if you have certain conditions or if you travel or work in places where you may be exposed to hepatitis A.  Hepatitis B vaccine. You may need this if you have certain conditions or if you travel or work in places where you may be exposed to hepatitis B.  Haemophilus influenzae type b (Hib) vaccine. You may need this if you have certain conditions.  Talk to your health care provider about which screenings and vaccines you need and how often you need them. This information is not intended to replace advice given to you by your health care provider. Make sure you discuss any questions you have with your health care provider. Document Released: 06/27/2015 Document Revised: 02/18/2016 Document Reviewed: 04/01/2015 Elsevier Interactive Patient Education  2017 Else[employer] Inc[employer]    Bone Densitometry Bone densitometry is an imaging test that uses a special X-ray to measure the amount of calcium and other minerals in your bones (bone density). This test is also known as a bone mineral density test or dual-energy X-ray absorptiometry (DXA). The test can measure bone density at your hip and your spine. It is similar to having a regular X-ray. You may have this  test to:  Diagnose a condition that causes weak or thin bones (osteoporosis).  Predict your risk of a broken bone (fracture).  Determine how well osteoporosis treatment is working.  Tell a health care provider about:  Any allergies you  have.  All medicines you are taking, including vitamins, herbs, eye drops, creams, and over-the-counter medicines.  Any problems you or family members have had with anesthetic medicines.  Any blood disorders you have.  Any surgeries you have had.  Any medical conditions you have.  Possibility of pregnancy.  Any other medical test you had within the previous 14 days that used contrast material. What are the risks? Generally, this is a safe procedure. However, problems can occur and may include the following:  This test exposes you to a very small amount of radiation.  The risks of radiation exposure may be greater to unborn children.  What happens before the procedure?  Do not take any calcium supplements for 24 hours before having the test. You can otherwise eat and drink what you usually do.  Take off all metal jewelry, eyeglasses, dental appliances, and any other metal objects. What happens during the procedure?  You may lie on an exam table. There will be an X-ray generator below you and an imaging device above you.  Other devices, such as boxes or braces, may be used to position your body properly for the scan.  You will need to lie still while the machine slowly scans your body.  The images will show up on a computer monitor. What happens after the procedure? You may need more testing at a later time. This information is not intended to replace advice given to you by your health care provider. Make sure you discuss any questions you have with your health care provider. Document Released: 06/22/2004 Document Revised: 11/06/2015 Document Reviewed: 11/08/2013 Elsevier Interactive Patient Education  2018 [employer].    [place]. Rachel Blair M.D.

## 2016-11-22 NOTE — Patient Instructions (Addendum)
Will notify you  of labs when available.  Caution with sleep  Meds.  Get appt for bone density . Plan rov in 6 months med check    Preventive Care 65 Years and Older, Female Preventive care refers to lifestyle choices and visits with your health care provider that can promote health and wellness. What does preventive care include?  A yearly physical exam. This is also called an annual well check.  Dental exams once or twice a year.  Routine eye exams. Ask your health care provider how often you should have your eyes checked.  Personal lifestyle choices, including: ? Daily care of your teeth and gums. ? Regular physical activity. ? Eating a healthy diet. ? Avoiding tobacco and drug use. ? Limiting alcohol use. ? Practicing safe sex. ? Taking low-dose aspirin every day. ? Taking vitamin and mineral supplements as recommended by your health care provider. What happens during an annual well check? The services and screenings done by your health care provider during your annual well check will depend on your age, overall health, lifestyle risk factors, and family history of disease. Counseling Your health care provider may ask you questions about your:  Alcohol use.  Tobacco use.  Drug use.  Emotional well-being.  Home and relationship well-being.  Sexual activity.  Eating habits.  History of falls.  Memory and ability to understand (cognition).  Work and work Statistician.  Reproductive health.  Screening You may have the following tests or measurements:  Height, weight, and BMI.  Blood pressure.  Lipid and cholesterol levels. These may be checked every 5 years, or more frequently if you are over 2 years old.  Skin check.  Lung cancer screening. You may have this screening every year starting at age 54 if you have a 30-pack-year history of smoking and currently smoke or have quit within the past 15 years.  Fecal occult blood test (FOBT) of the stool.  You may have this test every year starting at age 110.  Flexible sigmoidoscopy or colonoscopy. You may have a sigmoidoscopy every 5 years or a colonoscopy every 10 years starting at age 20.  Hepatitis C blood test.  Hepatitis B blood test.  Sexually transmitted disease (STD) testing.  Diabetes screening. This is done by checking your blood sugar (glucose) after you have not eaten for a while (fasting). You may have this done every 1-3 years.  Bone density scan. This is done to screen for osteoporosis. You may have this done starting at age 70.  Mammogram. This may be done every 1-2 years. Talk to your health care provider about how often you should have regular mammograms.  Talk with your health care provider about your test results, treatment options, and if necessary, the need for more tests. Vaccines Your health care provider may recommend certain vaccines, such as:  Influenza vaccine. This is recommended every year.  Tetanus, diphtheria, and acellular pertussis (Tdap, Td) vaccine. You may need a Td booster every 10 years.  Varicella vaccine. You may need this if you have not been vaccinated.  Zoster vaccine. You may need this after age 71.  Measles, mumps, and rubella (MMR) vaccine. You may need at least one dose of MMR if you were born in 1957 or later. You may also need a second dose.  Pneumococcal 13-valent conjugate (PCV13) vaccine. One dose is recommended after age 50.  Pneumococcal polysaccharide (PPSV23) vaccine. One dose is recommended after age 67.  Meningococcal vaccine. You may need this if  you have certain conditions.  Hepatitis A vaccine. You may need this if you have certain conditions or if you travel or work in places where you may be exposed to hepatitis A.  Hepatitis B vaccine. You may need this if you have certain conditions or if you travel or work in places where you may be exposed to hepatitis B.  Haemophilus influenzae type b (Hib) vaccine. You may  need this if you have certain conditions.  Talk to your health care provider about which screenings and vaccines you need and how often you need them. This information is not intended to replace advice given to you by your health care provider. Make sure you discuss any questions you have with your health care provider. Document Released: 06/27/2015 Document Revised: 02/18/2016 Document Reviewed: 04/01/2015 Elsevier Interactive Patient Education  2017 [place].    Bone Densitometry Bone densitometry is an imaging test that uses a special X-ray to measure the amount of calcium and other minerals in your bones (bone density). This test is also known as a bone mineral density test or dual-energy X-ray absorptiometry (DXA). The test can measure bone density at your hip and your spine. It is similar to having a regular X-ray. You may have this test to:  Diagnose a condition that causes weak or thin bones (osteoporosis).  Predict your risk of a broken bone (fracture).  Determine how well osteoporosis treatment is working.  Tell a health care provider about:  Any allergies you have.  All medicines you are taking, including vitamins, herbs, eye drops, creams, and over-the-counter medicines.  Any problems you or family members have had with anesthetic medicines.  Any blood disorders you have.  Any surgeries you have had.  Any medical conditions you have.  Possibility of pregnancy.  Any other medical test you had within the previous 14 days that used contrast material. What are the risks? Generally, this is a safe procedure. However, problems can occur and may include the following:  This test exposes you to a very small amount of radiation.  The risks of radiation exposure may be greater to unborn children.  What happens before the procedure?  Do not take any calcium supplements for 24 hours before having the test. You can otherwise eat and drink what you usually do.  Take  off all metal jewelry, eyeglasses, dental appliances, and any other metal objects. What happens during the procedure?  You may lie on an exam table. There will be an X-ray generator below you and an imaging device above you.  Other devices, such as boxes or braces, may be used to position your body properly for the scan.  You will need to lie still while the machine slowly scans your body.  The images will show up on a computer monitor. What happens after the procedure? You may need more testing at a later time. This information is not intended to replace advice given to you by your health care provider. Make sure you discuss any questions you have with your health care provider. Document Released: 06/22/2004 Document Revised: 11/06/2015 Document Reviewed: 11/08/2013 Elsevier Interactive Patient Education  2018 [employer].

## 2016-11-23 LAB — HEPATITIS C ANTIBODY: HCV AB: NEGATIVE

## 2016-12-20 ENCOUNTER — Ambulatory Visit (INDEPENDENT_AMBULATORY_CARE_PROVIDER_SITE_OTHER)
Admission: RE | Admit: 2016-12-20 | Discharge: 2016-12-20 | Disposition: A | Discharge status: Home / Self Care | Payer: Medicare Other | Source: Ambulatory Visit | Attending: Internal Medicine | Admitting: Internal Medicine

## 2016-12-20 ENCOUNTER — Ambulatory Visit (INDEPENDENT_AMBULATORY_CARE_PROVIDER_SITE_OTHER): Payer: Medicare Other | Admitting: Adult Health

## 2016-12-20 ENCOUNTER — Encounter: Payer: Self-pay | Admitting: Adult Health

## 2016-12-20 DIAGNOSIS — E669 Obesity, unspecified: Secondary | ICD-10-CM | POA: Diagnosis not present

## 2016-12-20 DIAGNOSIS — G4731 Primary central sleep apnea: Secondary | ICD-10-CM | POA: Diagnosis not present

## 2016-12-20 DIAGNOSIS — E2839 Other primary ovarian failure: Secondary | ICD-10-CM

## 2016-12-20 NOTE — Assessment & Plan Note (Signed)
Wt loss  

## 2016-12-20 NOTE — Addendum Note (Signed)
Addended by: Boone MasterJONES, Zaya Kessenich E on: 12/20/2016 09:51 AM   Modules accepted: Orders

## 2016-12-20 NOTE — Assessment & Plan Note (Addendum)
Excellent  Control and compliance  Plan  Patient Instructions  Continue on ASV /Sleep machine at bedtime Keep up the good work Work on Raytheonweight loss Do not drive a sleepy Follow with Dr. Vassie LollAlva in 1 year and as needed

## 2016-12-20 NOTE — Progress Notes (Signed)
@Patient  ID: Rachel Blair, female    DOB: [birth year], 66 y.o.   MRN: [id number]  Chief Complaint  Patient presents with  . Follow-up    OSA    Referring provider: Madelin Headings, MD  HPI: 66 year old female followed for severe sleep apnea on ASV sleep machine .   TEST  Download 10/2015 >> on ASV - no residuals, mild leak , usage > 7h  HST 05/2014:  AHI 68/hr ASV titration study 07/2014:  Optimal pressure >>  PS 3-15, EPAP 7  12/20/2016 Follow up: OSA  Patient returns for one-year follow-up for sleep apnea. Patient says she is doing well with her ASV machine . Feels rested with no significant daytime sleepiness.  Does notice that her machine reservoir has been leaking. She has contacted her home care company, but needs an order from our office to have it maintenance. Download shows excellent compliance with 100% usage. She uses it each night 8 hours. She is on ASV mode with an EPAP of 7 cm H2O  MIn PS  3cmH2Ocm and maximum pressure for 15 cm H2O. AHI 0.6. Minimum leaks.   Allergies  Allergen Reactions  . Amphetamine-Dextroamphet Er     REACTION: Coming out of skin  . Duloxetine     REACTION: Sleepy and nausea  . Hydrocodone-Acetaminophen     REACTION: abdominal pain  . Losartan     Chest pain  . Morphine Sulfate     REACTION: abdominal pain    Immunization History  Administered Date(s) Administered  . Influenza Split 02/21/2012, 03/14/2016  . Influenza,inj,Quad PF,36+ Mos 02/20/2013, 02/25/2014, 04/08/2015  . Pneumococcal Conjugate-13 11/22/2016  . Pneumococcal Polysaccharide-23 02/20/2013  . Td 06/09/2010  . Zoster 11/15/2011    Past Medical History:  Diagnosis Date  . Allergy   . Arthritis   . CHEST DISCOMFORT 08/30/2007   Qualifier: Diagnosis of  By: Fabian Sharp MD, Neta Mends   . Chest pain    atypical  . History of pneumonia   . Hypertension   . Nasal septal deviation   . OBSTRUCTIVE SLEEP APNEA 08/30/2007   Qualifier: Diagnosis of  By: Fabian Sharp MD, Neta Mends   .  Odontogenic cyst    of left maxillary sinus  . OSA (obstructive sleep apnea)    moderatley severe, uses CPAP nightly  . Trigger thumb of left hand     Tobacco History: History  Smoking Status  . Former Smoker  . Packs/day: 1.00  . Years: 30.00  . Types: Cigarettes  . Quit date: 06/14/1998  Smokeless Tobacco  . Never Used   Counseling given: Not Answered   Outpatient Encounter Prescriptions as of 12/20/2016  Medication Sig  . hydrochlorothiazide (HYDRODIURIL) 25 MG tablet TAKE 1 TABLET BY MOUTH DAILY  . LORazepam (ATIVAN) 0.5 MG tablet TAKE 1 TO 2 TABLETS BY MOUTH BEFORE FLIGHT AS NEEDED AND AS DIRECTED. DO NOT TAKE WITH AMBIEN  . zolpidem (AMBIEN) 10 MG tablet TAKE 1 TABLET BY MOUTH EVERY DAY AT BEDTIME AS NEEDED FOR SLEEP   No facility-administered encounter medications on file as of 12/20/2016.      Review of Systems  Constitutional:   No  weight loss, night sweats,  Fevers, chills, fatigue, or  lassitude.  HEENT:   No headaches,  Difficulty swallowing,  Tooth/dental problems, or  Sore throat,                No sneezing, itching, ear ache, nasal congestion, post nasal drip,   CV:  No  chest pain,  Orthopnea, PND, swelling in lower extremities, anasarca, dizziness, palpitations, syncope.   GI  No heartburn, indigestion, abdominal pain, nausea, vomiting, diarrhea, change in bowel habits, loss of appetite, bloody stools.   Resp: No shortness of breath with exertion or at rest.  No excess mucus, no productive cough,  No non-productive cough,  No coughing up of blood.  No change in color of mucus.  No wheezing.  No chest wall deformity  Skin: no rash or lesions.  GU: no dysuria, change in color of urine, no urgency or frequency.  No flank pain, no hematuria   MS:  No joint pain or swelling.  No decreased range of motion.  No back pain.    Physical Exam  BP 108/76 (BP Location: Left Arm, Cuff Size: Large)   Pulse 79   Ht 5\' 4"  (1.626 m)   Wt 228 lb 6.4 oz (103.6 kg)    SpO2 98%   BMI 39.20 kg/m   GEN: A/Ox3; pleasant , NAD, obese   HEENT:  [place]/AT,  EACs-clear, TMs-wnl, NOSE-clear, THROAT-clear, no lesions, no postnasal drip or exudate noted. Class 2-3 MP airway  NECK:  Supple w/ fair ROM; no JVD; normal carotid impulses w/o bruits; no thyromegaly or nodules palpated; no lymphadenopathy.    RESP  Clear  P & A; w/o, wheezes/ rales/ or rhonchi. no accessory muscle use, no dullness to percussion  CARD:  RRR, no m/r/g, no peripheral edema, pulses intact, no cyanosis or clubbing.  GI:   Soft & nt; nml bowel sounds; no organomegaly or masses detected.   Musco: Warm bil, no deformities or joint swelling noted.   Neuro: alert, no focal deficits noted.    Skin: Warm, no lesions or rashes    Lab Results:  CBC    Component Value Date/Time   WBC 7.1 11/22/2016 1046   RBC 5.15 (H) 11/22/2016 1046   HGB 15.8 (H) 11/22/2016 1046   HCT 45.6 11/22/2016 1046   PLT 254.0 11/22/2016 1046   MCV 88.5 11/22/2016 1046   MCH 29.8 06/26/2010 0646   MCHC 34.8 11/22/2016 1046   RDW 13.8 11/22/2016 1046   LYMPHSABS 2.5 11/22/2016 1046   MONOABS 0.7 11/22/2016 1046   EOSABS 0.1 11/22/2016 1046   BASOSABS 0.0 11/22/2016 1046    BMET    Component Value Date/Time   NA 139 11/22/2016 1046   K 3.6 11/22/2016 1046   CL 103 11/22/2016 1046   CO2 28 11/22/2016 1046   GLUCOSE 79 11/22/2016 1046   BUN 15 11/22/2016 1046   CREATININE 0.83 11/22/2016 1046   CALCIUM 9.3 11/22/2016 1046   GFRNONAA >60 05/26/2015 0840   GFRAA >60 05/26/2015 0840    BNP No results found for: BNP  ProBNP No results found for: PROBNP  Imaging: No results found.   Assessment & Plan:   Complex sleep apnea syndrome Excellent  Control and compliance  Plan  Patient Instructions  Continue on ASV /Sleep machine at bedtime Keep up the good work Work on [employer] loss Do not drive a sleepy Follow with Dr. Vassie Loll in 1 year and as needed    OBESITY Wt loss      Rubye Oaks, NP 12/20/2016

## 2016-12-20 NOTE — Patient Instructions (Addendum)
Continue on ASV /Sleep machine at bedtime Keep up the good work Work on [employer]weight loss Do not drive a sleepy Follow with Dr. Vassie LollAlva in 1 year and as needed

## 2016-12-21 NOTE — Progress Notes (Signed)
Reviewed & agree with plan  

## 2017-01-30 ENCOUNTER — Other Ambulatory Visit: Payer: Self-pay | Admitting: Internal Medicine

## 2017-02-26 ENCOUNTER — Other Ambulatory Visit: Payer: Self-pay | Admitting: Internal Medicine

## 2017-02-28 NOTE — Telephone Encounter (Signed)
Ok to refill x 2 ( 6 mos)

## 2017-03-02 NOTE — Telephone Encounter (Signed)
Rx refilled #90 x 1 refill.  Nothing further needed.

## 2017-03-04 ENCOUNTER — Encounter: Payer: Self-pay | Admitting: Internal Medicine

## 2017-04-22 ENCOUNTER — Ambulatory Visit
Admission: RE | Admit: 2017-04-22 | Discharge: 2017-04-22 | Disposition: A | Discharge status: Home / Self Care | Payer: Medicare Other | Source: Ambulatory Visit | Attending: Internal Medicine | Admitting: Internal Medicine

## 2017-04-22 ENCOUNTER — Other Ambulatory Visit: Payer: Self-pay | Admitting: Internal Medicine

## 2017-04-22 DIAGNOSIS — Z1231 Encounter for screening mammogram for malignant neoplasm of breast: Secondary | ICD-10-CM

## 2017-05-01 ENCOUNTER — Other Ambulatory Visit: Payer: Self-pay | Admitting: Internal Medicine

## 2017-05-03 ENCOUNTER — Ambulatory Visit (INDEPENDENT_AMBULATORY_CARE_PROVIDER_SITE_OTHER): Payer: Medicare Other | Admitting: Family Medicine

## 2017-05-03 ENCOUNTER — Encounter: Payer: Self-pay | Admitting: Family Medicine

## 2017-05-03 VITALS — BP 120/76 | HR 76 | Temp 98.7°F | Resp 16 | Ht 64.0 in | Wt 234.4 lb

## 2017-05-03 DIAGNOSIS — L600 Ingrowing nail: Secondary | ICD-10-CM

## 2017-05-03 MED ORDER — SULFAMETHOXAZOLE-TRIMETHOPRIM 800-160 MG PO TABS: 1.0000 | ORAL_TABLET | Freq: Two times a day (BID) | ORAL | 0 refills | Status: AC

## 2017-05-03 NOTE — Progress Notes (Signed)
ACUTE VISIT   HPI:  Chief Complaint  Patient presents with  . Ingrown Toenail    [name] is a 66 y.o. female, who is here today complaining of 2-3 days of pain around toenail of left great toe.  Gradual onset and getting worse.  "Hurts like hell" No Hx of toe trauma. + Erythema and edema, but she has not noted drainage.  Intermittent pain, sharp,mainly at bedtime when blankets touch toenail. Pain is alleviated by avoiding contact. She wears open shoes and she has no pain when walking.  No associated fever, chills, decreased appetite, joint edema or erythema, or limitation of foot ROM.  Taking Naproxen prn, applying OTC Neosporin, soaking foot in Epson salt  According to patient, to try to arrange appointment with podiatrist but no appointments available before Thanksgiving.    Review of Systems  Constitutional: Negative for appetite change, chills, fatigue and fever.  Respiratory: Negative for shortness of breath and wheezing.   Cardiovascular: Negative for palpitations and leg swelling.  Gastrointestinal: Negative for nausea and vomiting.  Musculoskeletal: Positive for arthralgias. Negative for gait problem and myalgias.  Skin: Negative for pallor and wound.  Neurological: Negative for weakness, numbness and headaches.  Psychiatric/Behavioral: Positive for sleep disturbance. Negative for confusion.      Current Outpatient Medications on File Prior to Visit  Medication Sig Dispense Refill  . hydrochlorothiazide (HYDRODIURIL) 25 MG tablet TAKE 1 TABLET BY MOUTH DAILY 90 tablet 0  . LORazepam (ATIVAN) 0.5 MG tablet TAKE 1 TO 2 TABLETS BY MOUTH BEFORE FLIGHT AS NEEDED AND AS DIRECTED. DO NOT TAKE WITH AMBIEN 10 tablet 0  . zolpidem (AMBIEN) 10 MG tablet TAKE 1 TABLET BY MOUTH EVERY NIGHT AT BEDTIME AS NEEDED FOR SLEEP 90 tablet 1   No current facility-administered medications on file prior to visit.      Past Medical History:  Diagnosis Date  .  Allergy   . Arthritis   . CHEST DISCOMFORT 08/30/2007   Qualifier: Diagnosis of  By: Fabian SharpPanosh MD, Neta MendsWanda K   . Chest pain    atypical  . History of pneumonia   . Hypertension   . Nasal septal deviation   . OBSTRUCTIVE SLEEP APNEA 08/30/2007   Qualifier: Diagnosis of  By: Fabian SharpPanosh MD, Neta MendsWanda K   . Odontogenic cyst    of left maxillary sinus  . OSA (obstructive sleep apnea)    moderatley severe, uses CPAP nightly  . Trigger thumb of left hand    Allergies  Allergen Reactions  . Amphetamine-Dextroamphet Er     REACTION: Coming out of skin  . Duloxetine     REACTION: Sleepy and nausea  . Hydrocodone-Acetaminophen     REACTION: abdominal pain  . Losartan     Chest pain  . Morphine Sulfate     REACTION: abdominal pain    Social History   Socioeconomic History  . Marital status: Married    Spouse name: None  . Number of children: 3  . Years of education: None  . Highest education level: None  Social Needs  . Financial resource strain: None  . Food insecurity - worry: None  . Food insecurity - inability: None  . Transportation needs - medical: None  . Transportation needs - non-medical: None  Occupational History  . Occupation: retired  Tobacco Use  . Smoking status: Former Smoker    Packs/day: 1.00    Years: 30.00    Pack years: 30.00    Types:  Cigarettes    Last attempt to quit: 06/14/1998    Years since quitting: 18.8  . Smokeless tobacco: Never Used  Substance and Sexual Activity  . Alcohol use: Yes    Comment: once a month  . Drug use: No  . Sexual activity: Yes    Birth control/protection: None  Other Topics Concern  . 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   Physical Exam  Nursing note and vitals reviewed. Constitutional: She is oriented to person, place, and time. She appears well-developed. She does not appear ill. No distress.  HENT:  Head: Normocephalic and atraumatic.  Eyes: Conjunctivae are normal.  Cardiovascular: Normal rate and regular rhythm.  Pulses:      Dorsalis pedis pulses are 2+ on the left side.       Posterior tibial pulses are 2+ on the left side.  Respiratory: Effort normal.  Musculoskeletal: She exhibits no edema.       Left ankle: She exhibits normal range of motion and no swelling.       Left foot: There is tenderness and swelling (Periungual). There is normal capillary refill.  Neurological: She is alert and oriented to person, place, and time. She has normal strength. Gait normal.  Skin: Skin is warm. No rash noted.  Left great toe,medial aspect with edema and erythema. No active purulent drainage. Toenail ingrown.  Psychiatric: She has a normal mood and affect.  Well groomed, good eye contact.    ASSESSMENT AND PLAN:   Ms. Rachel Blair was seen today for ingrown toenail.  Diagnoses and all orders for this visit:  Ingrowing toenail with infection  Marked edema and erythema, I did not appreciate abscess today. Elevation. Continue soaking his foot in Epson salt and warm water, she can try to lift corner of toe nail with q tip after soaking foot for a few minutes. Abx recommended. Clearly instructed about warning signs. Follow-up with podiatrist in 7 days.  -     sulfamethoxazole-trimethoprim (BACTRIM DS,SEPTRA DS) 800-160 MG tablet; Take 1 tablet by mouth 2 (two) times daily for 7 days.    Return in about 1 week (around 05/10/2017) for Podiatrist.     -[name] was advised to seek immediate medical attention if sudden worsening symptoms.      Betty G. SwazilandJordan, MD  [facility]. [place] office.

## 2017-05-03 NOTE — Patient Instructions (Addendum)
Rachel Blair I have seen you today for an acute visit.  A few things to remember from today's visit:   Ingrowing toenail with infection - Plan: sulfamethoxazole-trimethoprim (BACTRIM DS,SEPTRA DS) 800-160 MG tablet   Medications prescribed today are intended for short period of time and will not be refill upon request, a follow up appointment might be necessary to discuss continuation of of treatment if appropriate. Ingrown Toenail An ingrown toenail occurs when the corner or sides of your toenail grow into the surrounding skin. The big toe is most commonly affected, but it can happen to any of your toes. If your ingrown toenail is not treated, you will be at risk for infection. What are the causes? This condition may be caused by:  Wearing shoes that are too small or tight.  Injury or trauma, such as stubbing your toe or having your toe stepped on.  Improper cutting or care of your toenails.  Being born with (congenital) nail or foot abnormalities, such as having a nail that is too big for your toe.  What increases the risk? Risk factors for an ingrown toenail include:  Age. Your nails tend to thicken as you get older, so ingrown nails are more common in older people.  Diabetes.  Cutting your toenails incorrectly.  Blood circulation problems.  What are the signs or symptoms? Symptoms may include:  Pain, soreness, or tenderness.  Redness.  Swelling.  Hardening of the skin surrounding the toe.  Your ingrown toenail may be infected if there is fluid, pus, or drainage. How is this diagnosed? An ingrown toenail may be diagnosed by medical history and physical exam. If your toenail is infected, your health care provider may test a sample of the drainage. How is this treated? Treatment depends on the severity of your ingrown toenail. Some ingrown toenails may be treated at home. More severe or infected ingrown toenails may require surgery to remove all or part of the  nail. Infected ingrown toenails may also be treated with antibiotic medicines. Follow these instructions at home:  If you were prescribed an antibiotic medicine, finish all of it even if you start to feel better.  Soak your foot in warm soapy water for 20 minutes, 3 times per day or as directed by your health care provider.  Carefully lift the edge of the nail away from the sore skin by wedging a small piece of cotton under the corner of the nail. This may help with the pain. Be careful not to cause more injury to the area.  Wear shoes that fit well. If your ingrown toenail is causing you pain, try wearing sandals, if possible.  Trim your toenails regularly and carefully. Do not cut them in a curved shape. Cut your toenails straight across. This prevents injury to the skin at the corners of the toenail.  Keep your feet clean and dry.  If you are having trouble walking and are given crutches by your health care provider, use them as directed.  Do not pick at your toenail or try to remove it yourself.  Take medicines only as directed by your health care provider.  Keep all follow-up visits as directed by your health care provider. This is important. Contact a health care provider if:  Your symptoms do not improve with treatment. Get help right away if:  You have red streaks that start at your foot and go up your leg.  You have a fever.  You have increased redness, swelling, or pain.  You have fluid, blood, or pus coming from your toenail. This information is not intended to replace advice given to you by your health care provider. Make sure you discuss any questions you have with your health care provider. Document Released: 05/28/2000 Document Revised: 10/31/2015 Document Reviewed: 04/24/2014 Elsevier Interactive Patient Education  [employer]2018 [employer]     In general please monitor for signs of worsening symptoms and seek immediate medical attention if any concerning.  Please  arrange appt with podiatrist for follow up in a week or so.  Ibuprofen 600 mg at bedtime for pain.   I hope you get better soon!

## 2017-05-04 ENCOUNTER — Ambulatory Visit: Payer: Medicare Other | Admitting: Family Medicine

## 2017-05-04 ENCOUNTER — Ambulatory Visit: Payer: Medicare Other | Admitting: Internal Medicine

## 2017-05-09 ENCOUNTER — Encounter: Payer: Self-pay | Admitting: Podiatry

## 2017-05-09 ENCOUNTER — Ambulatory Visit (INDEPENDENT_AMBULATORY_CARE_PROVIDER_SITE_OTHER): Payer: Medicare Other | Admitting: Podiatry

## 2017-05-09 DIAGNOSIS — L600 Ingrowing nail: Secondary | ICD-10-CM

## 2017-05-09 MED ORDER — GENTAMICIN SULFATE 0.1 % EX CREA: 1.0000 "application " | TOPICAL_CREAM | Freq: Three times a day (TID) | CUTANEOUS | 1 refills | Status: DC

## 2017-05-11 NOTE — Progress Notes (Signed)
   Subjective: Patient presents today for evaluation of pain to the lateral border of the left great toe that has been present for the past week. She reports associated redness and swelling of the area. Patient is concerned for possible ingrown nail. Applying pressure to the area increases the pain. She denies alleviating factors. She is currently taking Bactrim as treatment prescribed by her PCP. Patient presents today for further treatment and evaluation.   Past Medical History:  Diagnosis Date  . Allergy   . Arthritis   . CHEST DISCOMFORT 08/30/2007   Qualifier: Diagnosis of  By: Fabian SharpPanosh MD, Neta MendsWanda K   . Chest pain    atypical  . History of pneumonia   . Hypertension   . Nasal septal deviation   . OBSTRUCTIVE SLEEP APNEA 08/30/2007   Qualifier: Diagnosis of  By: Fabian SharpPanosh MD, Neta MendsWanda K   . Odontogenic cyst    of left maxillary sinus  . OSA (obstructive sleep apnea)    moderatley severe, uses CPAP nightly  . Trigger thumb of left hand     Objective:  General: Well developed, nourished, in no acute distress, alert and oriented x3   Dermatology: Skin is warm, dry and supple bilateral. Lateral border of the left great toe appears to be erythematous with evidence of an ingrowing nail. Pain on palpation noted to the border of the nail fold. The remaining nails appear unremarkable at this time. There are no open sores, lesions.  Vascular: Dorsalis Pedis artery and Posterior Tibial artery pedal pulses palpable. No lower extremity edema noted.   Neruologic: Grossly intact via light touch bilateral.  Musculoskeletal: Muscular strength within normal limits in all groups bilateral. Normal range of motion noted to all pedal and ankle joints.   Assesement: #1 Paronychia with ingrowing nail lateral border of the left great toe #2 Pain in toe #3 Incurvated nail  Plan of Care:  1. Patient evaluated.  2. Discussed treatment alternatives and plan of care. Explained nail avulsion procedure and post  procedure course to patient. 3. Patient opted for permanent partial nail avulsion.  4. Prior to procedure, local anesthesia infiltration utilized using 3 ml of a 50:50 mixture of 2% plain lidocaine and 0.5% plain marcaine in a normal hallux block fashion and a betadine prep performed.  5. Partial permanent nail avulsion with chemical matrixectomy performed using 3x30sec applications of phenol followed by alcohol flush.  6. Light dressing applied. 7. Prescription for gentamicin cream provided to patient. 8. Continue taking Bactrim as prescribed by PCP. 9. Return to clinic in 2 weeks.   Felecia ShellingBrent M. Devone Tousley, DPM [facility]  Dr. Felecia ShellingBrent M. Jamire Shabazz, DPM    [address]                                        [place], [place] [id number]                Office [phone]  Fax [phone]

## 2017-05-17 NOTE — Progress Notes (Signed)
Chief Complaint  Patient presents with  . Follow-up    Pt had ingrown toenail removed d/t recurrent infection. Pt requesting Lidocaine Patches for lower back pain.    HPI: 66[name] 66 y.o. come in for Chronic disease management    Sleep meds   Night ly and does well   On cpap   dexa last time   lidocane   Patches    Care with child   pcicking up.   Hx of lbp and actually saw pain med  But lidoderm did well  Also with out narcotic.  Failed ice and heat    ocass tylenol and ibuprogen.   Remote hs of pain center for back.   Daughter has R A ROS: See pertinent positives and negatives per HPI.  Past Medical History:  Diagnosis Date  . Allergy   . Arthritis   . CHEST DISCOMFORT 08/30/2007   Qualifier: Diagnosis of  By: Fabian SharpPanosh MD, Neta MendsWanda K   . Chest pain    atypical  . History of pneumonia   . Hypertension   . Nasal septal deviation   . OBSTRUCTIVE SLEEP APNEA 08/30/2007   Qualifier: Diagnosis of  By: Fabian SharpPanosh MD, Neta MendsWanda K   . Odontogenic cyst    of left maxillary sinus  . OSA (obstructive sleep apnea)    moderatley severe, uses CPAP nightly  . Trigger thumb of left hand     Family History  Problem Relation Age of Onset  . Heart disease Mother   . Stroke Father   . Heart failure Father   . Emphysema Father   . Snoring Brother   . Lung cancer Brother   . Rheum arthritis Daughter   . Breast cancer Neg Hx     Social History   Socioeconomic History  . Marital status: Married    Spouse name: None  . Number of children: 3  . Years of education: None  . Highest education level: None  Social Needs  . Financial resource strain: None  . Food insecurity - worry: None  . Food insecurity - inability: None  . Transportation needs - medical: None  . Transportation needs - non-medical: None  Occupational History  . Occupation: retired  Tobacco Use  . Smoking status: Former Smoker    Packs/day: 1.00    Years: 30.00    Pack years: 30.00    Types: Cigarettes   Last attempt to quit: 06/14/1998    Years since quitting: 18.9  . Smokeless tobacco: Never Used  Substance and Sexual Activity  . Alcohol use: Yes    Comment: once a month  . Drug use: No  . Sexual activity: Yes    Birth control/protection: None  Other Topics Concern  . 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 gentamicin cream (GARAMYCIN) 0.1 % Apply 1 application topically 3 (three) times daily. 30 g 1  . hydrochlorothiazide (HYDRODIURIL) 25 MG tablet TAKE 1 TABLET BY MOUTH DAILY 90 tablet 0  . LORazepam (ATIVAN) 0.5 MG tablet TAKE 1 TO 2 TABLETS  BY MOUTH BEFORE FLIGHT AS NEEDED AND AS DIRECTED. DO NOT TAKE WITH AMBIEN 10 tablet 0  . zolpidem (AMBIEN) 10 MG tablet TAKE 1 TABLET BY MOUTH EVERY NIGHT AT BEDTIME AS NEEDED FOR SLEEP 90 tablet 1   No facility-administered medications prior to visit.      EXAM:  BP 124/70 (BP Location: Left Arm, Patient Position: Sitting, Cuff Size: Normal)   Pulse 77   Temp 98.5 F (36.9 C) (Oral)   Wt 224 lb (101.6 kg)   BMI 38.45 kg/m   Body mass index is 38.45 kg/m.  GENERAL: vitals reviewed and listed above, alert, oriented, appears well hydrated and in no acute distress HEENT: atraumatic, conjunctiva  clear, no obvious abnormalities on inspection of external nose and ears OP : no lesion edema or exudate  NECK: no obvious masses on inspection palpation  LUNGS: clear to auscultation bilaterally, no wheezes, rales or rhonchi, good air movement CV: HRRR, no clubbing cyanosis or  peripheral edema nl cap refill  MS: moves all extremities without noticeable focal  Abnormality Abdomen:  Sof,t normal bowel sounds without hepatosplenomegaly, no  guarding rebound or masses no CVA tenderness LBP no point tenderness  Neg slr  Discomfort arising from table  But no acute deficits   PSYCH: pleasant and cooperative, no obvious depression or anxiety Lab Results  Component Value Date   WBC 7.1 11/22/2016   HGB 15.8 (H) 11/22/2016   HCT 45.6 11/22/2016   PLT 254.0 11/22/2016   GLUCOSE 79 11/22/2016   CHOL 184 11/22/2016   TRIG 192.0 (H) 11/22/2016   HDL 48.00 11/22/2016   LDLDIRECT 121.8 02/26/2011   LDLCALC 97 11/22/2016   ALT 19 11/22/2016   AST 14 11/22/2016   NA 139 11/22/2016   K 3.6 11/22/2016   CL 103 11/22/2016   CREATININE 0.83 11/22/2016   BUN 15 11/22/2016   CO2 28 11/22/2016   TSH 1.37 11/22/2016   HGBA1C 5.9 11/22/2016   BP Readings from Last 3 Encounters:  05/18/17 124/70  05/03/17 120/76  12/20/16 108/76    ASSESSMENT AND PLAN:  Discussed the following assessment and plan:  Insomnia, unspecified type  Medication management  Essential hypertension  Complex sleep apnea syndrome  Midline low back pain without sciatica, unspecified chronicity  Hyperglycemia - controled   follow  Benefit more than risk of medications  to continue. Try to dec to 5 mg ambien    As tolerated   Future poss trial of belsomra    lbp   helpd in past byt lidoderm patches   rx today  May not be covered but rx .   6 mos cpx and lab at that time or as needed  -Patient advised to return or notify health care team  if  new concerns arise.  Patient Instructions  Woodward KuWeill send in lidocaine patches but  not certain will be paid for.   Caution with ambien.   As discussed    cpx and meds and labs   In 6 months or as needed      Burna MortimerWanda K. Ayme Short M.D.

## 2017-05-18 ENCOUNTER — Encounter: Payer: Self-pay | Admitting: Internal Medicine

## 2017-05-18 ENCOUNTER — Ambulatory Visit (INDEPENDENT_AMBULATORY_CARE_PROVIDER_SITE_OTHER): Payer: Medicare Other | Admitting: Internal Medicine

## 2017-05-18 VITALS — BP 124/70 | HR 77 | Temp 98.5°F | Wt 224.0 lb

## 2017-05-18 DIAGNOSIS — M545 Low back pain, unspecified: Secondary | ICD-10-CM

## 2017-05-18 DIAGNOSIS — I10 Essential (primary) hypertension: Secondary | ICD-10-CM | POA: Diagnosis not present

## 2017-05-18 DIAGNOSIS — G4731 Primary central sleep apnea: Secondary | ICD-10-CM

## 2017-05-18 DIAGNOSIS — R739 Hyperglycemia, unspecified: Secondary | ICD-10-CM

## 2017-05-18 DIAGNOSIS — G4700 Insomnia, unspecified: Secondary | ICD-10-CM | POA: Diagnosis not present

## 2017-05-18 DIAGNOSIS — Z79899 Other long term (current) drug therapy: Secondary | ICD-10-CM

## 2017-05-18 MED ORDER — LIDOCAINE 5 % EX PTCH: 1.0000 | MEDICATED_PATCH | CUTANEOUS | 3 refills | Status: DC

## 2017-05-18 MED ORDER — ZOLPIDEM TARTRATE 10 MG PO TABS: 10.0000 mg | ORAL_TABLET | Freq: Every evening | ORAL | 0 refills | Status: DC | PRN

## 2017-05-18 NOTE — Patient Instructions (Signed)
Weill send in lidocaine patches but  not certain will be paid for.   Caution with ambien.   As discussed    cpx and meds and labs   In 6 months or as needed

## 2017-05-23 ENCOUNTER — Ambulatory Visit: Payer: Medicare Other | Admitting: Internal Medicine

## 2017-05-23 ENCOUNTER — Ambulatory Visit: Payer: Medicare Other | Admitting: Podiatry

## 2017-05-30 ENCOUNTER — Ambulatory Visit (INDEPENDENT_AMBULATORY_CARE_PROVIDER_SITE_OTHER): Payer: Medicare Other | Admitting: Podiatry

## 2017-05-30 ENCOUNTER — Encounter: Payer: Self-pay | Admitting: Podiatry

## 2017-05-30 ENCOUNTER — Telehealth: Payer: Self-pay | Admitting: *Deleted

## 2017-05-30 DIAGNOSIS — L600 Ingrowing nail: Secondary | ICD-10-CM

## 2017-05-30 NOTE — Telephone Encounter (Signed)
Prior auth for Lidocaine 5% patch sent to [email or web address].

## 2017-06-01 NOTE — Progress Notes (Signed)
   Subjective: Patient presents today 2 weeks post ingrown nail permanent nail avulsion procedure. Patient states that the toe and nail fold is feeling much better. She denies any drainage or bleeding.  She also reports a new complaint of intermittent pain to the plantar aspect of the right foot just below the right fifth toe that began about 6 months ago. Walking and standing increases the pain. She states she has trimmed the area down and is requesting a cream to smooth the area out. Patient is here for further evaluation and treatment.   Past Medical History:  Diagnosis Date  . Allergy   . Arthritis   . CHEST DISCOMFORT 08/30/2007   Qualifier: Diagnosis of  By: Fabian SharpPanosh MD, Neta MendsWanda K   . Chest pain    atypical  . History of pneumonia   . Hypertension   . Nasal septal deviation   . OBSTRUCTIVE SLEEP APNEA 08/30/2007   Qualifier: Diagnosis of  By: Fabian SharpPanosh MD, Neta MendsWanda K   . Odontogenic cyst    of left maxillary sinus  . OSA (obstructive sleep apnea)    moderatley severe, uses CPAP nightly  . Trigger thumb of left hand     Objective: Skin is warm, dry and supple. Nail and respective nail fold appears to be healing appropriately. Open wound to the associated nail fold with a granular wound base and moderate amount of fibrotic tissue. Minimal drainage noted. Mild erythema around the periungual region likely due to phenol chemical matricectomy.  Assessment: #1 postop permanent partial nail avulsion lateral border left great toe #2 open wound periungual nail fold of respective digit.   Plan of care: #1 patient was evaluated  #2 debridement of open wound was performed to the periungual border of the respective toe using a currette. Antibiotic ointment and Band-Aid was applied. #3 patient is to return to clinic on a PRN basis.   Felecia ShellingBrent M. Thiago Ragsdale, DPM [facility]  Dr. Felecia ShellingBrent M. Lamontae Ricardo, DPM    [address]                                        [place], [place] [address]                 Office [phone]  Fax [phone]

## 2017-06-16 ENCOUNTER — Telehealth: Payer: Self-pay | Admitting: Internal Medicine

## 2017-06-16 NOTE — Telephone Encounter (Signed)
Fax received from [employer] stating the Lidocaine patch was denied and this placed on Ashton's desk for Dr Fabian SharpPanosh.

## 2017-06-16 NOTE — Telephone Encounter (Signed)
Rachel Blair, [name], CMA Certified Medical Assistant Signed  Creation Time: 05/30/2017 4:54 PM      Prior auth for Lidocaine 5% patch sent to [email or web address].

## 2017-06-16 NOTE — Telephone Encounter (Signed)
Copied from CRM [id number]. Topic: Quick Communication - See Telephone Encounter >> Jun 16, 2017 12:28 PM Trula SladeWalter, Linda F wrote: CRM for notification. See Telephone encounter for:  06/16/17. Albin FellingCarla w/[employer] [phone], Ref#: [id number] would like someone to answer clinical questions about the patient's prescription for a Lidocaine Patch.

## 2017-06-16 NOTE — Telephone Encounter (Signed)
Sent to Dr Fabian SharpPanosh for recommendations on alternative.  See open PA encounter.

## 2017-06-16 NOTE — Telephone Encounter (Signed)
This is in your yellow folder if need to review.  Please advise Dr Fabian SharpPanosh, thanks.

## 2017-06-21 NOTE — Telephone Encounter (Signed)
This is for her  Back pain /neuralgia   That  Failed oral pain medications   And PT   She has used this in the  past   With success  I do not know of other alternatives  But can ask the insurance company  What they think should be used .

## 2017-06-22 NOTE — Telephone Encounter (Signed)
Pt aware of rec's per Dr Fabian SharpPanosh. Pt states that she is going to try OTC treatment and may also reach out to her insurance for alternative options if OTC means do not work. Pt to let our office know if anything further needed.

## 2017-06-24 NOTE — Telephone Encounter (Signed)
See other messagees

## 2017-07-04 NOTE — Progress Notes (Signed)
Chief Complaint  Patient presents with  . Tinnitus    Ringing and pulsing in left ear x 1 week. No BP changes, no fever, no ear pain. Notes dull HA x 1 week. Pt states that she will check her BP when the ringing/pulsing gets very loud and its good. Some nausea.     HPI: Rachel Blair 67 y.o. come in for Chronic disease management   Left side and hear pulse .  And very loud at times.   On about a week and  Can be loud    No neuro sx .  ? continueous and some loud sometims change in position related.    Ringing high pitched  And hard to hear tv with left ear  New onset.   No vertigo.  No hx of hearing issues or similar sx   Has checked bp and pulse and no correlation .   No hx of same .   ROS: See pertinent positives and negatives per HPI. No new meds and   No cv pulm sx  Syncope numbness visiton changes  Past Medical History:  Diagnosis Date  . Allergy   . Arthritis   . CHEST DISCOMFORT 08/30/2007   Qualifier: Diagnosis of  By: Fabian SharpPanosh MD, Neta MendsWanda K   . Chest pain    atypical  . History of pneumonia   . Hypertension   . Nasal septal deviation   . OBSTRUCTIVE SLEEP APNEA 08/30/2007   Qualifier: Diagnosis of  By: Fabian SharpPanosh MD, Neta MendsWanda K   . Odontogenic cyst    of left maxillary sinus  . OSA (obstructive sleep apnea)    moderatley severe, uses CPAP nightly  . Trigger thumb of left hand     Family History  Problem Relation Age of Onset  . Heart disease Mother   . Stroke Father   . Heart failure Father   . Emphysema Father   . Snoring Brother   . Lung cancer Brother   . Rheum arthritis Daughter   . Breast cancer Neg Hx     Social History   Socioeconomic History  . Marital status: Married    Spouse name: Not on file  . Number of children: 3  . Years of education: Not on file  . Highest education level: Not on file  Social Needs  . Financial resource strain: Not on file  . Food insecurity - worry: Not on file  . Food insecurity - inability: Not on file  . Transportation  needs - medical: Not on file  . Transportation needs - non-medical: Not on file  Occupational History  . Occupation: retired  Tobacco Use  . Smoking status: Former Smoker    Packs/day: 1.00    Years: 30.00    Pack years: 30.00    Types: Cigarettes    Last attempt to quit: 06/14/1998    Years since quitting: 19.0  . Smokeless tobacco: Never Used  Substance and Sexual Activity  . Alcohol use: Yes    Comment: once a month  . Drug use: No  . Sexual activity: Yes    Birth control/protection: None  Other Topics Concern  . 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 gentamicin cream (GARAMYCIN) 0.1 % Apply 1 application topically 3 (three) times daily. 30 g 1  . hydrochlorothiazide (HYDRODIURIL) 25 MG tablet TAKE 1 TABLET BY MOUTH DAILY 90 tablet 0  . LORazepam (ATIVAN) 0.5 MG tablet TAKE 1 TO 2 TABLETS BY MOUTH BEFORE FLIGHT AS NEEDED AND AS DIRECTED. DO NOT TAKE WITH AMBIEN 10 tablet 0  . zolpidem (AMBIEN) 10 MG tablet Take 1 tablet (10 mg total) by mouth at bedtime as needed. for sleep 90 tablet 0  . lidocaine (LIDODERM) 5 % Place 1 patch onto the skin daily. Remove & Discard patch within 12 hours or as directed by MD for back nerve pain (Patient not taking: Reported on 07/06/2017) 15 patch 3   No facility-administered medications prior to visit.      EXAM:  BP 112/82 (BP Location: Left Arm, Patient Position: Sitting, Cuff Size: Large)   Pulse 71   Temp 98.3 F (36.8 C) (Oral)   Wt 234 lb 3.2 oz (106.2 kg)   BMI 40.20 kg/m   Body mass index is 40.2 kg/m.  GENERAL: vitals reviewed and listed above, alert, oriented, appears well hydrated and in no acute distress HEENT:  atraumatic, conjunctiva  clear, no obvious abnormalities on inspection of external nose and ears  Nob ruits heard  Head  OP : no lesion edema or exudate  Tongue midline  NECK: no obvious masses on inspection palpation   LUNGS: clear to auscultation bilaterally, no wheezes, rales or rhonchi, good air movement CV: HRRR, no clubbing cyanosis or  No murmur heard today  nl cap refill  MS: moves all extremities without noticeable focal  abnormality PSYCH: pleasant and cooperative, no obvious depression or anxiety Lab Results  Component Value Date   WBC 7.1 11/22/2016   HGB 15.8 (H) 11/22/2016   HCT 45.6 11/22/2016   PLT 254.0 11/22/2016   GLUCOSE 79 11/22/2016   CHOL 184 11/22/2016   TRIG 192.0 (H) 11/22/2016   HDL 48.00 11/22/2016   LDLDIRECT 121.8 02/26/2011   LDLCALC 97 11/22/2016   ALT 19 11/22/2016   AST 14 11/22/2016   NA 139 11/22/2016   K 3.6 11/22/2016   CL 103 11/22/2016   CREATININE 0.83 11/22/2016   BUN 15 11/22/2016   CO2 28 11/22/2016   TSH 1.37 11/22/2016   HGBA1C 5.9 11/22/2016   BP Readings from Last 3 Encounters:  07/06/17 112/82  05/18/17 124/70  05/03/17 120/76     Hearing Screening   125Hz  250Hz  500Hz  1000Hz  2000Hz  3000Hz  4000Hz  6000Hz  8000Hz   Right ear:   20 20 15 20 25     Left ear:   25 15 5 30  35        ASSESSMENT AND PLAN:  Discussed the following assessment and plan:  Tinnitus of left ear pulsatile - Plan: Ambulatory referral to ENT  Decreased hearing of left ear - mild discrepancy  percieved hearing change - Plan: Ambulatory referral to ENT Pu;lsatile tinnitus  Ongoing     Left only  With perceived hearing change   And slightly  discrepancy   On  Hearing screen today   No alarm sx and neuro seems nl  Will get ent  Evaluation   For further advice  Evaluation as  Indicated  -Patient advised to return or notify health care team  if  new concerns arise.  Patient Instructions  Getting referral to ENT  For evaluation may  Require imaging of blood  vessels  Near ear  Had and neck.     Your exam is reassuring otherwise .     Neta Mends. [name] M.D.

## 2017-07-06 ENCOUNTER — Encounter: Payer: Self-pay | Admitting: Internal Medicine

## 2017-07-06 ENCOUNTER — Ambulatory Visit (INDEPENDENT_AMBULATORY_CARE_PROVIDER_SITE_OTHER): Payer: Medicare Other | Admitting: Internal Medicine

## 2017-07-06 VITALS — BP 112/82 | HR 71 | Temp 98.3°F | Wt 234.2 lb

## 2017-07-06 DIAGNOSIS — H9312 Tinnitus, left ear: Secondary | ICD-10-CM

## 2017-07-06 DIAGNOSIS — H9192 Unspecified hearing loss, left ear: Secondary | ICD-10-CM | POA: Diagnosis not present

## 2017-07-06 NOTE — Patient Instructions (Signed)
Getting referral to ENT  For evaluation may  Require imaging of blood vessels  Near ear  Had and neck.     Your exam is reassuring otherwise .

## 2017-08-04 ENCOUNTER — Other Ambulatory Visit: Payer: Self-pay | Admitting: Internal Medicine

## 2017-09-12 ENCOUNTER — Other Ambulatory Visit: Payer: Self-pay | Admitting: Internal Medicine

## 2017-09-12 NOTE — Telephone Encounter (Signed)
Sent electronically 

## 2017-09-12 NOTE — Telephone Encounter (Signed)
Last seen 07/06/17(acute) and 05/18/17 (insomnia) Last filled 05/18/17, #90  Please advise Dr Fabian SharpPanosh, thanks.

## 2017-09-16 ENCOUNTER — Telehealth: Payer: Self-pay | Admitting: *Deleted

## 2017-09-16 NOTE — Telephone Encounter (Signed)
Prior auth for Zolpidem Tartrate 10mg  sent to [email or web address].

## 2017-09-19 NOTE — Telephone Encounter (Signed)
Fax received from [employer] stating the request was denied and this was given to Dr Rosezella FloridaPanosh's asst.

## 2017-09-20 NOTE — Telephone Encounter (Signed)
Fax is in Dr Fabian Sharppanosh red folder for review.  Alternatives on form.

## 2017-09-22 NOTE — Telephone Encounter (Signed)
Tell  Patient the  Denial of medication    And has to try the other meds first \ . She was on belsomra  10 a while back  But  Will  need to try again on this insurance   Can send in belsomra 20 mg per day  Disp # 30   Take 1 po hs  No refill   Tell her that transition  From Remus Lofflerambien is a bit of withdrawal and  May take a week to get  Good effects

## 2017-09-27 NOTE — Telephone Encounter (Signed)
Pt states that the pharmacy went ahead and filled her Ambien as curtesy this month for 30-day supply  Which Belsomra were you wanting to fill? 10mg  or 20mg ? How do you want her to start the belsomra since she is still taking Ambien for the next 30 days?  Please advise Dr Fabian SharpPanosh, thanks.

## 2017-09-27 NOTE — Telephone Encounter (Signed)
  See previous message  "Can send in belsomra 20 mg per day  Disp # 30   Take 1 po hs  No refill "  "Tell her that transition  From Remus Lofflerambien is a bit of withdrawal and  May take a week to get  Good effects  "  She can do 1/ 2dose of [place] for a few days then  Switch to belsomra

## 2017-09-28 MED ORDER — SUVOREXANT 20 MG PO TABS: 1.0000 | ORAL_TABLET | Freq: Every day | ORAL | 0 refills | Status: DC

## 2017-09-28 NOTE — Telephone Encounter (Signed)
Belsomra 20mg  phoned into pharmacy on file Pt aware via voicemail.  Nothing further needed.

## 2017-10-27 ENCOUNTER — Other Ambulatory Visit: Payer: Self-pay | Admitting: *Deleted

## 2017-11-16 ENCOUNTER — Encounter: Payer: Self-pay | Admitting: Family Medicine

## 2017-11-16 ENCOUNTER — Ambulatory Visit (INDEPENDENT_AMBULATORY_CARE_PROVIDER_SITE_OTHER): Payer: Medicare Other | Admitting: Family Medicine

## 2017-11-16 VITALS — BP 140/88 | HR 84 | Temp 98.5°F | Ht 64.0 in | Wt 234.8 lb

## 2017-11-16 DIAGNOSIS — J3089 Other allergic rhinitis: Secondary | ICD-10-CM

## 2017-11-16 DIAGNOSIS — R350 Frequency of micturition: Secondary | ICD-10-CM

## 2017-11-16 DIAGNOSIS — J019 Acute sinusitis, unspecified: Secondary | ICD-10-CM

## 2017-11-16 DIAGNOSIS — N390 Urinary tract infection, site not specified: Secondary | ICD-10-CM

## 2017-11-16 LAB — POCT URINALYSIS DIPSTICK
BILIRUBIN UA: NEGATIVE
GLUCOSE UA: NEGATIVE
KETONES UA: NEGATIVE
Leukocytes, UA: NEGATIVE
Nitrite, UA: NEGATIVE
Protein, UA: NEGATIVE
RBC UA: NEGATIVE
Spec Grav, UA: 1.01 (ref 1.010–1.025)
UROBILINOGEN UA: 0.2 U/dL
pH, UA: 7 (ref 5.0–8.0)

## 2017-11-16 MED ORDER — METHYLPREDNISOLONE 4 MG PO TBPK: ORAL_TABLET | ORAL | 0 refills | Status: DC

## 2017-11-16 MED ORDER — LEVOFLOXACIN 500 MG PO TABS: 500.0000 mg | ORAL_TABLET | Freq: Every day | ORAL | 0 refills | Status: AC

## 2017-11-16 NOTE — Progress Notes (Signed)
Subjective:    Patient ID: Rachel Blair, female    DOB: 1951/01/05, 67 y.o.   MRN: [id number]  HPI Here for several issues. First she has struggled with itchy watery eyes, PND, ST and a dry cough for several months. No fevers. She typically has trouble with allergies every spring, primarily with oak pollen. She takes Claritin and Flonase daily. Over the past week she has also developed sinus pressure and both of her cheeks hurt. In addition for one week she has had increased urgency to urinate with burning and a foul odor to the urine. She drinks plenty of water. She notes she is scheduled for cataract surgery next week wit Dr. Darel Hong.    Review of Systems  Constitutional: Negative.   HENT: Positive for congestion, postnasal drip, sinus pressure, sinus pain and sore throat. Negative for ear pain.   Eyes: Positive for itching. Negative for discharge, redness and visual disturbance.  Respiratory: Positive for cough.   Genitourinary: Positive for dysuria, frequency and urgency. Negative for flank pain and hematuria.       Objective:   Physical Exam  Constitutional: She appears well-developed and well-nourished.  HENT:  Right Ear: External ear normal.  Left Ear: External ear normal.  Nose: Nose normal.  Mouth/Throat: Oropharynx is clear and moist.  Eyes: Conjunctivae are normal.  Neck: No thyromegaly present.  Pulmonary/Chest: Effort normal and breath sounds normal. No stridor. No respiratory distress. She has no wheezes. She has no rales.  Abdominal: Soft. Bowel sounds are normal. She exhibits no distension and no mass. There is no tenderness. There is no rebound and no guarding. No hernia.  Lymphadenopathy:    She has no cervical adenopathy.          Assessment & Plan:  She has been dealing with spring time allergies and now she has a sinusitis on top of these. Treat with Levaquin and a Medrol dose pack. She seems to have a UTI as well, but the Levaquin should cover that  nicely. We will culture the urine sample. I advised her to contact Dr. Darel Hong to postpone the cataract surgery.  Gershon Crane, MD

## 2017-11-17 LAB — URINE CULTURE
MICRO NUMBER:: 90676597
SPECIMEN QUALITY:: ADEQUATE

## 2017-11-21 ENCOUNTER — Encounter: Payer: Self-pay | Admitting: Internal Medicine

## 2017-11-21 ENCOUNTER — Ambulatory Visit (INDEPENDENT_AMBULATORY_CARE_PROVIDER_SITE_OTHER): Payer: Medicare Other | Admitting: Internal Medicine

## 2017-11-21 VITALS — BP 110/70 | HR 75 | Temp 98.1°F | Ht 64.0 in | Wt 231.3 lb

## 2017-11-21 DIAGNOSIS — G4700 Insomnia, unspecified: Secondary | ICD-10-CM | POA: Diagnosis not present

## 2017-11-21 DIAGNOSIS — R7301 Impaired fasting glucose: Secondary | ICD-10-CM

## 2017-11-21 DIAGNOSIS — Z0000 Encounter for general adult medical examination without abnormal findings: Secondary | ICD-10-CM

## 2017-11-21 DIAGNOSIS — J3089 Other allergic rhinitis: Secondary | ICD-10-CM | POA: Diagnosis not present

## 2017-11-21 DIAGNOSIS — G4731 Primary central sleep apnea: Secondary | ICD-10-CM | POA: Diagnosis not present

## 2017-11-21 DIAGNOSIS — Z79899 Other long term (current) drug therapy: Secondary | ICD-10-CM

## 2017-11-21 DIAGNOSIS — I10 Essential (primary) hypertension: Secondary | ICD-10-CM | POA: Diagnosis not present

## 2017-11-21 LAB — HEMOGLOBIN A1C: Hgb A1c MFr Bld: 6 % (ref 4.6–6.5)

## 2017-11-21 LAB — BASIC METABOLIC PANEL
BUN: 17 mg/dL (ref 6–23)
CALCIUM: 9.2 mg/dL (ref 8.4–10.5)
CO2: 31 meq/L (ref 19–32)
CREATININE: 0.96 mg/dL (ref 0.40–1.20)
Chloride: 98 mEq/L (ref 96–112)
GFR: 61.71 mL/min (ref >60.00)
Glucose, Bld: 95 mg/dL (ref 70–99)
Potassium: 3.7 mEq/L (ref 3.5–5.1)
SODIUM: 138 meq/L (ref 135–145)

## 2017-11-21 LAB — CBC WITH DIFFERENTIAL/PLATELET
Basophils Absolute: 0.1 10*3/uL (ref 0.0–0.1)
Basophils Relative: 0.6 % (ref 0.0–3.0)
EOS ABS: 0.1 10*3/uL (ref 0.0–0.7)
Eosinophils Relative: 0.6 % (ref 0.0–5.0)
HEMATOCRIT: 47.3 % — AB (ref 36.0–46.0)
Hemoglobin: 16.2 g/dL — ABNORMAL HIGH (ref 12.0–15.0)
LYMPHS PCT: 25.1 % (ref 12.0–46.0)
Lymphs Abs: 2.7 10*3/uL (ref 0.7–4.0)
MCHC: 34.4 g/dL (ref 30.0–36.0)
MCV: 90 fl (ref 78.0–100.0)
MONOS PCT: 7.7 % (ref 3.0–12.0)
Monocytes Absolute: 0.8 10*3/uL (ref 0.1–1.0)
NEUTROS ABS: 7.1 10*3/uL (ref 1.4–7.7)
Neutrophils Relative %: 66 % (ref 43.0–77.0)
PLATELETS: 264 10*3/uL (ref 150.0–400.0)
RBC: 5.25 Mil/uL — AB (ref 3.87–5.11)
RDW: 13.8 % (ref 11.5–15.5)
WBC: 10.7 10*3/uL — AB (ref 4.0–10.5)

## 2017-11-21 LAB — HEPATIC FUNCTION PANEL
ALBUMIN: 4.3 g/dL (ref 3.5–5.2)
ALT: 20 U/L (ref 0–35)
AST: 13 U/L (ref 0–37)
Alkaline Phosphatase: 60 U/L (ref 39–117)
BILIRUBIN TOTAL: 1 mg/dL (ref 0.2–1.2)
Bilirubin, Direct: 0.2 mg/dL (ref 0.0–0.3)
Total Protein: 6.5 g/dL (ref 6.0–8.3)

## 2017-11-21 LAB — LIPID PANEL
CHOLESTEROL: 198 mg/dL (ref 0–200)
HDL: 58.6 mg/dL (ref >39.00)
LDL Cholesterol: 114 mg/dL — ABNORMAL HIGH (ref 0–99)
NonHDL: 139.3
TRIGLYCERIDES: 125 mg/dL (ref 0.0–149.0)
Total CHOL/HDL Ratio: 3
VLDL: 25 mg/dL (ref 0.0–40.0)

## 2017-11-21 LAB — TSH: TSH: 1.57 u[IU]/mL (ref 0.35–4.50)

## 2017-11-21 NOTE — Patient Instructions (Signed)
Avoid sugar  Beverages  To  Avoid getting diabetes .   Glad you are doing better   At this time agree  Ambien seems to be more helpful than harmful since the  belsomra  Not very effective .   But requires  Every 6 months  OV check    Health Maintenance, Female Adopting a healthy lifestyle and getting preventive care can go a long way to promote health and wellness. Talk with your health care provider about what schedule of regular examinations is right for you. This is a good chance for you to check in with your provider about disease prevention and staying healthy. In between checkups, there are plenty of things you can do on your own. Experts have done a lot of research about which lifestyle changes and preventive measures are most likely to keep you healthy. Ask your health care provider for more information. Weight and diet Eat a healthy diet  Be sure to include plenty of vegetables, fruits, low-fat dairy products, and lean protein.  Do not eat a lot of foods high in solid fats, added sugars, or salt.  Get regular exercise. This is one of the most important things you can do for your health. ? Most adults should exercise for at least 150 minutes each week. The exercise should increase your heart rate and make you sweat (moderate-intensity exercise). ? Most adults should also do strengthening exercises at least twice a week. This is in addition to the moderate-intensity exercise.  Maintain a healthy weight  Body mass index (BMI) is a measurement that can be used to identify possible weight problems. It estimates body fat based on height and weight. Your health care provider can help determine your BMI and help you achieve or maintain a healthy weight.  For females 77 years of age and older: ? A BMI below 18.5 is considered underweight. ? A BMI of 18.5 to 24.9 is normal. ? A BMI of 25 to 29.9 is considered overweight. ? A BMI of 30 and above is considered obese.  Watch levels of  cholesterol and blood lipids  You should start having your blood tested for lipids and cholesterol at 67 years of age, then have this test every 5 years.  You may need to have your cholesterol levels checked more often if: ? Your lipid or cholesterol levels are high. ? You are older than 67 years of age. ? You are at high risk for heart disease.  Cancer screening Lung Cancer  Lung cancer screening is recommended for adults 100-75 years old who are at high risk for lung cancer because of a history of smoking.  A yearly low-dose CT scan of the lungs is recommended for people who: ? Currently smoke. ? Have quit within the past 15 years. ? Have at least a 30-pack-year history of smoking. A pack year is smoking an average of one pack of cigarettes a day for 1 year.  Yearly screening should continue until it has been 15 years since you quit.  Yearly screening should stop if you develop a health problem that would prevent you from having lung cancer treatment.  Breast Cancer  Practice breast self-awareness. This means understanding how your breasts normally appear and feel.  It also means doing regular breast self-exams. Let your health care provider know about any changes, no matter how small.  If you are in your 20s or 30s, you should have a clinical breast exam (CBE) by a health care provider every  1-3 years as part of a regular health exam.  If you are 82 or older, have a CBE every year. Also consider having a breast X-ray (mammogram) every year.  If you have a family history of breast cancer, talk to your health care provider about genetic screening.  If you are at high risk for breast cancer, talk to your health care provider about having an MRI and a mammogram every year.  Breast cancer gene (BRCA) assessment is recommended for women who have family members with BRCA-related cancers. BRCA-related cancers include: ? Breast. ? Ovarian. ? Tubal. ? Peritoneal cancers.  Results  of the assessment will determine the need for genetic counseling and BRCA1 and BRCA2 testing.  Cervical Cancer Your health care provider may recommend that you be screened regularly for cancer of the pelvic organs (ovaries, uterus, and vagina). This screening involves a pelvic examination, including checking for microscopic changes to the surface of your cervix (Pap test). You may be encouraged to have this screening done every 3 years, beginning at age 42.  For women ages 79-65, health care providers may recommend pelvic exams and Pap testing every 3 years, or they may recommend the Pap and pelvic exam, combined with testing for human papilloma virus (HPV), every 5 years. Some types of HPV increase your risk of cervical cancer. Testing for HPV may also be done on women of any age with unclear Pap test results.  Other health care providers may not recommend any screening for nonpregnant women who are considered low risk for pelvic cancer and who do not have symptoms. Ask your health care provider if a screening pelvic exam is right for you.  If you have had past treatment for cervical cancer or a condition that could lead to cancer, you need Pap tests and screening for cancer for at least 20 years after your treatment. If Pap tests have been discontinued, your risk factors (such as having a new sexual partner) need to be reassessed to determine if screening should resume. Some women have medical problems that increase the chance of getting cervical cancer. In these cases, your health care provider may recommend more frequent screening and Pap tests.  Colorectal Cancer  This type of cancer can be detected and often prevented.  Routine colorectal cancer screening usually begins at 67 years of age and continues through 67 years of age.  Your health care provider may recommend screening at an earlier age if you have risk factors for colon cancer.  Your health care provider may also recommend using  home test kits to check for hidden blood in the stool.  A small camera at the end of a tube can be used to examine your colon directly (sigmoidoscopy or colonoscopy). This is done to check for the earliest forms of colorectal cancer.  Routine screening usually begins at age 89.  Direct examination of the colon should be repeated every 5-10 years through 67 years of age. However, you may need to be screened more often if early forms of precancerous polyps or small growths are found.  Skin Cancer  Check your skin from head to toe regularly.  Tell your health care provider about any new moles or changes in moles, especially if there is a change in a mole's shape or color.  Also tell your health care provider if you have a mole that is larger than the size of a pencil eraser.  Always use sunscreen. Apply sunscreen liberally and repeatedly throughout the day.  Protect  yourself by wearing long sleeves, pants, a wide-brimmed hat, and sunglasses whenever you are outside.  Heart disease, diabetes, and high blood pressure  High blood pressure causes heart disease and increases the risk of stroke. High blood pressure is more likely to develop in: ? People who have blood pressure in the high end of the normal range (130-139/85-89 mm Hg). ? People who are overweight or obese. ? People who are African American.  If you are 77-27 years of age, have your blood pressure checked every 3-5 years. If you are 30 years of age or older, have your blood pressure checked every year. You should have your blood pressure measured twice-once when you are at a hospital or clinic, and once when you are not at a hospital or clinic. Record the average of the two measurements. To check your blood pressure when you are not at a hospital or clinic, you can use: ? An automated blood pressure machine at a pharmacy. ? A home blood pressure monitor.  If you are between 81 years and 35 years old, ask your health care provider  if you should take aspirin to prevent strokes.  Have regular diabetes screenings. This involves taking a blood sample to check your fasting blood sugar level. ? If you are at a normal weight and have a low risk for diabetes, have this test once every three years after 67 years of age. ? If you are overweight and have a high risk for diabetes, consider being tested at a younger age or more often. Preventing infection Hepatitis B  If you have a higher risk for hepatitis B, you should be screened for this virus. You are considered at high risk for hepatitis B if: ? You were born in a country where hepatitis B is common. Ask your health care provider which countries are considered high risk. ? Your parents were born in a high-risk country, and you have not been immunized against hepatitis B (hepatitis B vaccine). ? You have HIV or AIDS. ? You use needles to inject street drugs. ? You live with someone who has hepatitis B. ? You have had sex with someone who has hepatitis B. ? You get hemodialysis treatment. ? You take certain medicines for conditions, including cancer, organ transplantation, and autoimmune conditions.  Hepatitis C  Blood testing is recommended for: ? Everyone born from [birth year] through 1965. ? Anyone with known risk factors for hepatitis C.  Sexually transmitted infections (STIs)  You should be screened for sexually transmitted infections (STIs) including gonorrhea and chlamydia if: ? You are sexually active and are younger than 67 years of age. ? You are older than 66 years of age and your health care provider tells you that you are at risk for this type of infection. ? Your sexual activity has changed since you were last screened and you are at an increased risk for chlamydia or gonorrhea. Ask your health care provider if you are at risk.  If you do not have HIV, but are at risk, it may be recommended that you take a prescription medicine daily to prevent HIV infection. This  is called pre-exposure prophylaxis (PrEP). You are considered at risk if: ? You are sexually active and do not regularly use condoms or know the HIV status of your partner(s). ? You take drugs by injection. ? You are sexually active with a partner who has HIV.  Talk with your health care provider about whether you are at high risk of being  infected with HIV. If you choose to begin PrEP, you should first be tested for HIV. You should then be tested every 3 months for as long as you are taking PrEP. Pregnancy  If you are premenopausal and you may become pregnant, ask your health care provider about preconception counseling.  If you may become pregnant, take 400 to 800 micrograms (mcg) of folic acid every day.  If you want to prevent pregnancy, talk to your health care provider about birth control (contraception). Osteoporosis and menopause  Osteoporosis is a disease in which the bones lose minerals and strength with aging. This can result in serious bone fractures. Your risk for osteoporosis can be identified using a bone density scan.  If you are 4 years of age or older, or if you are at risk for osteoporosis and fractures, ask your health care provider if you should be screened.  Ask your health care provider whether you should take a calcium or vitamin D supplement to lower your risk for osteoporosis.  Menopause may have certain physical symptoms and risks.  Hormone replacement therapy may reduce some of these symptoms and risks. Talk to your health care provider about whether hormone replacement therapy is right for you. Follow these instructions at home:  Schedule regular health, dental, and eye exams.  Stay current with your immunizations.  Do not use any tobacco products including cigarettes, chewing tobacco, or electronic cigarettes.  If you are pregnant, do not drink alcohol.  If you are breastfeeding, limit how much and how often you drink alcohol.  Limit alcohol intake  to no more than 1 drink per day for nonpregnant women. One drink equals 12 ounces of beer, 5 ounces of wine, or 1 ounces of hard liquor.  Do not use street drugs.  Do not share needles.  Ask your health care provider for help if you need support or information about quitting drugs.  Tell your health care provider if you often feel depressed.  Tell your health care provider if you have ever been abused or do not feel safe at home. This information is not intended to replace advice given to you by your health care provider. Make sure you discuss any questions you have with your health care provider. Document Released: 12/14/2010 Document Revised: 11/06/2015 Document Reviewed: 03/04/2015 [employer] Interactive Patient Education  [employer].

## 2017-11-21 NOTE — Progress Notes (Signed)
Chief Complaint  Patient presents with  . Annual Exam    HPI: Rachel Blair 67 y.o. comes in today for Preventive Medicare exam/ wellness visit .Since last visit.  Seen last week for sinus and urinary sx .   Left face pain much better  Given levaquin and pred   HT bp good  No concerns  Sleep still problematic  If not taking ambien  belsomra  For 2 weeks couldn't sleep and is more CPAP compliant on ambien  at this time  To have cataract surgery    Health Maintenance  Topic Date Due  . FOOT EXAM  05/24/1961  . OPHTHALMOLOGY EXAM  [birth year]/24/1961  . URINE MICROALBUMIN  05/24/1961  . HEMOGLOBIN A1C  05/24/2017  . INFLUENZA VACCINE  01/12/2018  . PNA vac Low Risk Adult (2 of 2 - PPSV23) 02/20/2018  . MAMMOGRAM  04/23/2019  . TETANUS/TDAP  06/09/2020  . COLONOSCOPY  02/23/2021  . DEXA SCAN  Completed  . Hepatitis C Screening  Completed   Health Maintenance Review LIFESTYLE:  Exercise:   Better some    Had prednisone  To helps.  Yard work  And wathces  A 67 year old.  Tobacco/ETS: none  Alcohol:  1 now and again  Sugar beverages: tea coffee diet drinks .  Sleep:   On ambien and   Not helping   belsomra   And thus compliant with the cpap.  Helps to be compliant and helps .  Drug use: no HH:  2  Dog    Hearing:  ok  Vision:  No limitations at present . Last eye check UTD  Safety:  Has smoke detector and wears seat belts.  . No excess sun exposure. Sees dentist regularly.  Falls:  no.  Memory: Felt to be good  , no concern from her or her family.  Depression: No anhedonia unusual crying or depressive symptoms  Nutrition: Eats well balanced diet; adequate calcium and vitamin D. No swallowing chewing problems.  Injury: no major injuries in the last six months.  Other healthcare providers:  Reviewed today .  Preventive parameters: up-to-date  Reviewed   ADLS:   There are no problems or need for assistance  driving, feeding, obtaining food, dressing, toileting and  bathing, managing money using phone. She is independent.    ROS:  See hpi  GEN/ HEENT: No fever, significant weight changes sweats headaches vision problems hearing changes, CV/ PULM; No chest pain shortness of breath cough, syncope,edema  change in exercise tolerance. GI /GU: No adominal pain, vomiting, change in bowel habits. No blood in the stool. No significant GU symptoms. SKIN/HEME: ,no acute skin rashes suspicious lesions or bleeding. No lymphadenopathy, nodules, masses.  NEURO/ PSYCH:  No neurologic signs such as weakness numbness. No depression anxiety. IMM/ Allergy: No unusual infections.  Allergy .   REST of 12 system review negative except as per HPI   Past Medical History:  Diagnosis Date  . Allergy   . Arthritis   . CHEST DISCOMFORT 08/30/2007   Qualifier: Diagnosis of  By: Regis Bill MD, Standley Brooking   . Chest pain    atypical  . History of pneumonia   . Hypertension   . Nasal septal deviation   . OBSTRUCTIVE SLEEP APNEA 08/30/2007   Qualifier: Diagnosis of  By: Regis Bill MD, Standley Brooking   . Odontogenic cyst    of left maxillary sinus  . OSA (obstructive sleep apnea)    moderatley severe, uses CPAP nightly  .  Trigger thumb of left hand     Family History  Problem Relation Age of Onset  . Heart disease Mother   . Stroke Father   . Heart failure Father   . Emphysema Father   . Snoring Brother   . Lung cancer Brother   . Rheum arthritis Daughter   . Breast cancer Neg Hx     Social History   Socioeconomic History  . Marital status: Married    Spouse name: Not on file  . Number of children: 3  . Years of education: Not on file  . Highest education level: Not on file  Occupational History  . Occupation: retired  Scientific laboratory technician  . Financial resource strain: Not on file  . Food insecurity:    Worry: Not on file    Inability: Not on file  . Transportation needs:    Medical: Not on file    Non-medical: Not on file  Tobacco Use  . Smoking status: Former Smoker     Packs/day: 1.00    Years: 30.00    Pack years: 30.00    Types: Cigarettes    Last attempt to quit: 06/14/1998    Years since quitting: 19.4  . Smokeless tobacco: Never Used  Substance and Sexual Activity  . Alcohol use: Yes    Comment: once a month  . Drug use: No  . Sexual activity: Yes    Birth control/protection: None  Lifestyle  . Physical activity:    Days per week: Not on file    Minutes per session: Not on file  . Stress: Not on file  Relationships  . Social connections:    Talks on phone: Not on file    Gets together: Not on file    Attends religious service: Not on file    Active member of club or organization: Not on file    Attends meetings of clubs or organizations: Not on file    Relationship status: Not on file  Other Topics Concern  . 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 gentamicin cream (GARAMYCIN) 0.1 % Apply 1 application topically 3 (three) times daily. (Patient not taking: Reported on 11/16/2017)  . hydrochlorothiazide (HYDRODIURIL) 25 MG tablet TAKE 1 TABLET BY MOUTH DAILY  . levofloxacin (LEVAQUIN) 500 MG tablet Take 1 tablet (500 mg total) by mouth daily for 10 days.  Marland Kitchen lidocaine (LIDODERM) 5 % Place 1 patch onto the skin daily. Remove & Discard patch within 12 hours or as directed by MD for back nerve pain  . LORazepam (ATIVAN) 0.5 MG tablet TAKE 1 TO 2 TABLETS BY MOUTH BEFORE FLIGHT AS NEEDED AND AS DIRECTED. DO NOT TAKE WITH AMBIEN  . methylPREDNISolone (MEDROL DOSEPAK) 4 MG TBPK tablet As directed  . Suvorexant (BELSOMRA) 20 MG TABS Take 1 tablet by mouth at bedtime. (Patient not taking: Reported on 11/16/2017)  . zolpidem (AMBIEN) 10 MG tablet TAKE  1 TABLET BY MOUTH EVERY NIGHT AT BEDTIME AS NEEDED FOR SLEEP   No facility-administered encounter medications on file as of  11/21/2017.     EXAM:  BP 110/70 (BP Location: Left Arm, Patient Position: Sitting, Cuff Size: Large)   Pulse 75   Temp 98.1 F (36.7 C) (Oral)   Ht _0  (1.626 m)   Wt 231 lb 4.8 oz (104.9 kg)   SpO2 97%   BMI 39.70 kg/m   Body mass index is 39.7 kg/m.  Physical Exam: Vital signs reviewed HER:DEYC is a well-developed well-nourished alert cooperative   who appears stated age in no acute distress.  HEENT: normocephalic atraumatic , Eyes: PERRL EOM's full, conjunctiva clear, Nares: paten,t no deformity discharge or tenderness., Ears: no deformity EAC's clear TMs with normal landmarks. Mouth: clear OP, no lesions, edema.  Moist mucous membranes. Dentition in adequate repair. NECK: supple without masses, thyromegaly or bruits. CHEST/PULM:  Clear to auscultation and percussion breath sounds equal no wheeze , rales or rhonchi. No chest wall deformities or tenderness.Breast: normal by inspection . No dimpling, discharge, masses, tenderness or discharge . CV: PMI is nondisplaced, S1 S2 no gallops, murmurs, rubs. Peripheral pulses are full without delay.No JVD .  ABDOMEN: Bowel sounds normal nontender  No guard or rebound, no hepato splenomegal no CVA tenderness.   Extremtities:  No clubbing cyanosis or edema, no acute joint swelling or redness no focal atrophy NEURO:  Oriented x3, cranial nerves 3-12 appear to be intact, no obvious focal weakness,gait within normal limits no abnormal reflexes or asymmetrical SKIN: No acute rashes normal turgor, color, no bruising or petechiae. PSYCH: Oriented, good eye contact, no obvious depression anxiety, cognition and judgment appear normal. LN: no cervical axillary inguinal adenopathy No noted deficits in memory, attention, and speech.   Lab Results  Component Value Date   WBC 10.7 (H) 11/21/2017   HGB 16.2 (H) 11/21/2017    HCT 47.3 (H) 11/21/2017   PLT 264.0 11/21/2017   GLUCOSE 95 11/21/2017   CHOL 198 11/21/2017   TRIG 125.0 11/21/2017   HDL 58.60 11/21/2017   LDLDIRECT 121.8 02/26/2011   LDLCALC 114 (H) 11/21/2017   ALT 20 11/21/2017   AST 13 11/21/2017   NA 138 11/21/2017   K 3.7 11/21/2017   CL 98 11/21/2017   CREATININE 0.96 11/21/2017   BUN 17 11/21/2017   CO2 31 11/21/2017   TSH 1.57 11/21/2017   HGBA1C 6.0 11/21/2017    ASSESSMENT AND PLAN:  Discussed the following assessment and plan:  Visit for preventive health examination  Insomnia, unspecified type - failed trial belsomra  benefit more thanrisk for ambien at this time - Plan: Basic metabolic panel, CBC with Differential/Platelet, Hemoglobin A1c, Hepatic function panel, Lipid panel, TSH  Medication management - Plan: Basic metabolic panel, CBC with Differential/Platelet, Hemoglobin A1c, Hepatic function panel, Lipid panel, TSH  Complex sleep apnea syndrome - Plan: Basic metabolic panel, CBC with Differential/Platelet, Hemoglobin A1c, Hepatic function panel, Lipid panel, TSH  Environmental and seasonal allergies - Plan: Basic metabolic panel, CBC with Differential/Platelet, Hemoglobin A1c, Hepatic function panel, Lipid panel, TSH  Fasting hyperglycemia - Plan: Basic metabolic panel, CBC with Differential/Platelet, Hemoglobin A1c, Hepatic function panel, Lipid panel, TSH  Essential hypertension - Plan: Basic metabolic panel, CBC with Differential/Platelet, Hemoglobin A1c, Hepatic function panel, Lipid panel, TSH Wbc may be up from prednisone just finished  And hg from [facility]  Patient Care Team: Burnis Medin, MD as PCP - General Darlin Coco, MD (Cardiology) Rigoberto Noel, MD as Consulting Physician (Pulmonary Disease)  Patient Instructions  Avoid sugar  Beverages  To  Avoid getting diabetes .  Glad you are doing better   At this time agree  Ambien seems to be more helpful than harmful since the  belsomra  Not very  effective .   But requires  Every 6 months  OV check    Health Maintenance, Female Adopting a healthy lifestyle and getting preventive care can go a long way to promote health and wellness. Talk with your health care provider about what schedule of regular examinations is right for you. This is a good chance for you to check in with your provider about disease prevention and staying healthy. In between checkups, there are plenty of things you can do on your own. Experts have done a lot of research about which lifestyle changes and preventive measures are most likely to keep you healthy. Ask your health care provider for more information. Weight and diet Eat a healthy diet  Be sure to include plenty of vegetables, fruits, low-fat dairy products, and lean protein.  Do not eat a lot of foods high in solid fats, added sugars, or salt.  Get regular exercise. This is one of the most important things you can do for your health. ? Most adults should exercise for at least 150 minutes each week. The exercise should increase your heart rate and make you sweat (moderate-intensity exercise). ? Most adults should also do strengthening exercises at least twice a week. This is in addition to the moderate-intensity exercise.  Maintain a healthy weight  Body mass index (BMI) is a measurement that can be used to identify possible weight problems. It estimates body fat based on height and weight. Your health care provider can help determine your BMI and help you achieve or maintain a healthy weight.  For females 40 years of age and older: ? A BMI below 18.5 is considered underweight. ? A BMI of 18.5 to 24.9 is normal. ? A BMI of 25 to 29.9 is considered overweight. ? A BMI of 30 and above is considered obese.  Watch levels of cholesterol and blood lipids  You should start having your blood tested for lipids and cholesterol at 67 years of age, then have this test every 5 years.  You may need to have your  cholesterol levels checked more often if: ? Your lipid or cholesterol levels are high. ? You are older than 68 years of age. ? You are at high risk for heart disease.  Cancer screening Lung Cancer  Lung cancer screening is recommended for adults 60-61 years old who are at high risk for lung cancer because of a history of smoking.  A yearly low-dose CT scan of the lungs is recommended for people who: ? Currently smoke. ? Have quit within the past 15 years. ? Have at least a 30-pack-year history of smoking. A pack year is smoking an average of one pack of cigarettes a day for 1 year.  Yearly screening should continue until it has been 15 years since you quit.  Yearly screening should stop if you develop a health problem that would prevent you from having lung cancer treatment.  Breast Cancer  Practice breast self-awareness. This means understanding how your breasts normally appear and feel.  It also means doing regular breast self-exams. Let your health care provider know about any changes, no matter how small.  If you are in your 20s or 30s, you should have a clinical breast exam (CBE) by a health care provider every 1-3 years as part of a regular health exam.  If you  are 63 or older, have a CBE every year. Also consider having a breast X-ray (mammogram) every year.  If you have a family history of breast cancer, talk to your health care provider about genetic screening.  If you are at high risk for breast cancer, talk to your health care provider about having an MRI and a mammogram every year.  Breast cancer gene (BRCA) assessment is recommended for women who have family members with BRCA-related cancers. BRCA-related cancers include: ? Breast. ? Ovarian. ? Tubal. ? Peritoneal cancers.  Results of the assessment will determine the need for genetic counseling and BRCA1 and BRCA2 testing.  Cervical Cancer Your health care provider may recommend that you be screened regularly  for cancer of the pelvic organs (ovaries, uterus, and vagina). This screening involves a pelvic examination, including checking for microscopic changes to the surface of your cervix (Pap test). You may be encouraged to have this screening done every 3 years, beginning at age 50.  For women ages 70-65, health care providers may recommend pelvic exams and Pap testing every 3 years, or they may recommend the Pap and pelvic exam, combined with testing for human papilloma virus (HPV), every 5 years. Some types of HPV increase your risk of cervical cancer. Testing for HPV may also be done on women of any age with unclear Pap test results.  Other health care providers may not recommend any screening for nonpregnant women who are considered low risk for pelvic cancer and who do not have symptoms. Ask your health care provider if a screening pelvic exam is right for you.  If you have had past treatment for cervical cancer or a condition that could lead to cancer, you need Pap tests and screening for cancer for at least 20 years after your treatment. If Pap tests have been discontinued, your risk factors (such as having a new sexual partner) need to be reassessed to determine if screening should resume. Some women have medical problems that increase the chance of getting cervical cancer. In these cases, your health care provider may recommend more frequent screening and Pap tests.  Colorectal Cancer  This type of cancer can be detected and often prevented.  Routine colorectal cancer screening usually begins at 67 years of age and continues through 67 years of age.  Your health care provider may recommend screening at an earlier age if you have risk factors for colon cancer.  Your health care provider may also recommend using home test kits to check for hidden blood in the stool.  A small camera at the end of a tube can be used to examine your colon directly (sigmoidoscopy or colonoscopy). This is done to  check for the earliest forms of colorectal cancer.  Routine screening usually begins at age 67.  Direct examination of the colon should be repeated every 5-10 years through 67 years of age. However, you may need to be screened more often if early forms of precancerous polyps or small growths are found.  Skin Cancer  Check your skin from head to toe regularly.  Tell your health care provider about any new moles or changes in moles, especially if there is a change in a mole's shape or color.  Also tell your health care provider if you have a mole that is larger than the size of a pencil eraser.  Always use sunscreen. Apply sunscreen liberally and repeatedly throughout the day.  Protect yourself by wearing long sleeves, pants, a wide-brimmed hat, and sunglasses whenever  you are outside.  Heart disease, diabetes, and high blood pressure  High blood pressure causes heart disease and increases the risk of stroke. High blood pressure is more likely to develop in: ? People who have blood pressure in the high end of the normal range (130-139/85-89 mm Hg). ? People who are overweight or obese. ? People who are African American.  If you are 32-25 years of age, have your blood pressure checked every 3-5 years. If you are 75 years of age or older, have your blood pressure checked every year. You should have your blood pressure measured twice-once when you are at a hospital or clinic, and once when you are not at a hospital or clinic. Record the average of the two measurements. To check your blood pressure when you are not at a hospital or clinic, you can use: ? An automated blood pressure machine at a pharmacy. ? A home blood pressure monitor.  If you are between 49 years and 49 years old, ask your health care provider if you should take aspirin to prevent strokes.  Have regular diabetes screenings. This involves taking a blood sample to check your fasting blood sugar level. ? If you are at a  normal weight and have a low risk for diabetes, have this test once every three years after 67 years of age. ? If you are overweight and have a high risk for diabetes, consider being tested at a younger age or more often. Preventing infection Hepatitis B  If you have a higher risk for hepatitis B, you should be screened for this virus. You are considered at high risk for hepatitis B if: ? You were born in a country where hepatitis B is common. Ask your health care provider which countries are considered high risk. ? Your parents were born in a high-risk country, and you have not been immunized against hepatitis B (hepatitis B vaccine). ? You have HIV or AIDS. ? You use needles to inject street drugs. ? You live with someone who has hepatitis B. ? You have had sex with someone who has hepatitis B. ? You get hemodialysis treatment. ? You take certain medicines for conditions, including cancer, organ transplantation, and autoimmune conditions.  Hepatitis C  Blood testing is recommended for: ? Everyone born from [birth year] through 1965. ? Anyone with known risk factors for hepatitis C.  Sexually transmitted infections (STIs)  You should be screened for sexually transmitted infections (STIs) including gonorrhea and chlamydia if: ? You are sexually active and are younger than 67 years of age. ? You are older than 67 years of age and your health care provider tells you that you are at risk for this type of infection. ? Your sexual activity has changed since you were last screened and you are at an increased risk for chlamydia or gonorrhea. Ask your health care provider if you are at risk.  If you do not have HIV, but are at risk, it may be recommended that you take a prescription medicine daily to prevent HIV infection. This is called pre-exposure prophylaxis (PrEP). You are considered at risk if: ? You are sexually active and do not regularly use condoms or know the HIV status of your  partner(s). ? You take drugs by injection. ? You are sexually active with a partner who has HIV.  Talk with your health care provider about whether you are at high risk of being infected with HIV. If you choose to begin PrEP, you should first  be tested for HIV. You should then be tested every 3 months for as long as you are taking PrEP. Pregnancy  If you are premenopausal and you may become pregnant, ask your health care provider about preconception counseling.  If you may become pregnant, take 400 to 800 micrograms (mcg) of folic acid every day.  If you want to prevent pregnancy, talk to your health care provider about birth control (contraception). Osteoporosis and menopause  Osteoporosis is a disease in which the bones lose minerals and strength with aging. This can result in serious bone fractures. Your risk for osteoporosis can be identified using a bone density scan.  If you are 20 years of age or older, or if you are at risk for osteoporosis and fractures, ask your health care provider if you should be screened.  Ask your health care provider whether you should take a calcium or vitamin D supplement to lower your risk for osteoporosis.  Menopause may have certain physical symptoms and risks.  Hormone replacement therapy may reduce some of these symptoms and risks. Talk to your health care provider about whether hormone replacement therapy is right for you. Follow these instructions at home:  Schedule regular health, dental, and eye exams.  Stay current with your immunizations.  Do not use any tobacco products including cigarettes, chewing tobacco, or electronic cigarettes.  If you are pregnant, do not drink alcohol.  If you are breastfeeding, limit how much and how often you drink alcohol.  Limit alcohol intake to no more than 1 drink per day for nonpregnant women. One drink equals 12 ounces of beer, 5 ounces of wine, or 1 ounces of hard liquor.  Do not use street  drugs.  Do not share needles.  Ask your health care provider for help if you need support or information about quitting drugs.  Tell your health care provider if you often feel depressed.  Tell your health care provider if you have ever been abused or do not feel safe at home. This information is not intended to replace advice given to you by your health care provider. Make sure you discuss any questions you have with your health care provider. Document Released: 12/14/2010 Document Revised: 11/06/2015 Document Reviewed: 03/04/2015 Elsevier Interactive Patient Education  2018 [place]. Ahyan Kreeger M.D.

## 2018-01-13 ENCOUNTER — Encounter: Payer: Self-pay | Admitting: Pulmonary Disease

## 2018-01-13 ENCOUNTER — Ambulatory Visit (INDEPENDENT_AMBULATORY_CARE_PROVIDER_SITE_OTHER): Payer: Medicare Other | Admitting: Pulmonary Disease

## 2018-01-13 DIAGNOSIS — G4731 Primary central sleep apnea: Secondary | ICD-10-CM | POA: Diagnosis not present

## 2018-01-13 DIAGNOSIS — F5104 Psychophysiologic insomnia: Secondary | ICD-10-CM

## 2018-01-13 NOTE — Patient Instructions (Signed)
ASV machine is working well. Supplies will be renewed for a year

## 2018-01-13 NOTE — Assessment & Plan Note (Signed)
She only uses benzodiazepines for before a flight. We discussed lowering dose of Ambien she seems to need a higher dose

## 2018-01-13 NOTE — Progress Notes (Signed)
Subjective:    Patient ID: Rachel Blair, female    DOB: 1950/07/12, 67 y.o.   MRN: [id number]  HPI  67 yo for FU of  severe sleep apnea on ASV sleep machine .  Annual follow-up. She uses nasal pillows and denies any problems with mask or pressure. ASV machine has made a dramatic difference and greatly improved her somnolence and fatigue.  She reports restorative sleep.  She continues to use 10 mg of Ambien every night she tried dropping this dose and continue to have insomnia. ASV download was reviewed which shows excellent control of events and weight compliance more than 8 hours every night with very few residuals, no central events. Mild leak was noted  Weight is fluctuated to within 5 pounds  Significant tests/ events reviewed   HST 05/2014: AHI 68/hr ASV titration study 07/2014: Optimal pressure >>PS 3-15, EPAP 7  Review of Systems Patient denies significant dyspnea,cough, hemoptysis,  chest pain, palpitations, pedal edema, orthopnea, paroxysmal nocturnal dyspnea, lightheadedness, nausea, vomiting, abdominal or  leg pains      Objective:   Physical Exam  Gen. Pleasant, well-nourished, in no distress ENT - no thrush, no post nasal drip Neck: No JVD, no thyromegaly, no carotid bruits Lungs: no use of accessory muscles, no dullness to percussion, clear without rales or rhonchi  Cardiovascular: Rhythm regular, heart sounds  normal, no murmurs or gallops, no peripheral edema Musculoskeletal: No deformities, no cyanosis or clubbing        Assessment & Plan:

## 2018-01-13 NOTE — Assessment & Plan Note (Addendum)
ASV machine is working well.  This has made a dramatic difference in her daytime somnolence and fatigue and has provided her with refreshing sleep Supplies will be renewed for a year  Weight loss encouraged, compliance with goal of at least 4-6 hrs every night is the expectation. Advised against medications with sedative side effects Cautioned against driving when sleepy - understanding that sleepiness will vary on a day to day basis

## 2018-02-06 ENCOUNTER — Other Ambulatory Visit: Payer: Self-pay | Admitting: Internal Medicine

## 2018-02-06 NOTE — Telephone Encounter (Signed)
Last filled 09/12/17 #90 x 0rf  Please advise Dr Fabian SharpPanosh, thanks.

## 2018-02-08 ENCOUNTER — Other Ambulatory Visit: Payer: Self-pay | Admitting: Internal Medicine

## 2018-02-17 ENCOUNTER — Telehealth: Payer: Self-pay | Admitting: *Deleted

## 2018-02-17 NOTE — Telephone Encounter (Signed)
Prior auth for Zolpidem tartrate 10mg  sent to [email or web address] [id number].  Note states the request received a favorable outcome and I called the [facility] and left a detailed message with this info.

## 2018-05-16 ENCOUNTER — Other Ambulatory Visit: Payer: Self-pay | Admitting: Internal Medicine

## 2018-05-19 NOTE — Telephone Encounter (Signed)
Last OV 12/2017 Last refilled 02/07/18 #90 x 0  Please advise Dr Fabian SharpPanosh, thanks.

## 2018-06-20 ENCOUNTER — Encounter: Payer: Self-pay | Admitting: Family Medicine

## 2018-06-20 ENCOUNTER — Ambulatory Visit (INDEPENDENT_AMBULATORY_CARE_PROVIDER_SITE_OTHER): Payer: Medicare Other | Admitting: Family Medicine

## 2018-06-20 VITALS — BP 124/78 | HR 77 | Temp 98.5°F | Resp 16 | Ht 64.0 in | Wt 233.4 lb

## 2018-06-20 DIAGNOSIS — J3089 Other allergic rhinitis: Secondary | ICD-10-CM

## 2018-06-20 DIAGNOSIS — J320 Chronic maxillary sinusitis: Secondary | ICD-10-CM

## 2018-06-20 MED ORDER — AMOXICILLIN-POT CLAVULANATE 875-125 MG PO TABS: 1.0000 | ORAL_TABLET | Freq: Two times a day (BID) | ORAL | 0 refills | Status: DC

## 2018-06-20 NOTE — Patient Instructions (Signed)
  Ms.Quintasha T Erpelding I have seen you today for an acute visit.  A few things to remember from today's visit:   Environmental and seasonal allergies  Maxillary sinusitis, unspecified chronicity - Plan: amoxicillin-clavulanate (AUGMENTIN) 875-125 MG tablet   If medications prescribed today, they will not be refill upon request, a follow up appointment with PCP will be necessary to discuss continuation of of treatment if appropriate.   Nasal irrigation with saline a few times during the day. Continue Flonase intranasal spray daily.   In general please monitor for signs of worsening symptoms and seek immediate medical attention if any concerning.    I hope you get better soon!

## 2018-06-20 NOTE — Progress Notes (Signed)
ACUTE VISIT  HPI:  Chief Complaint  Patient presents with  . Sinus drainage    sx started 3 weeks ago  . Cough    no phlem    [name] is a 68 y.o.female here today complaining of 3 weeks of respiratory symptoms. She thinks she may have a "sinus infection", requesting antibiotic treatment. Nasal congestion, rhinorrhea, postnasal drainage, and sneezing. "Awful taste" in her mouth She has not identified exacerbating or alleviating factors. Problem is constant. She denies fever, chills, or body aches.  Sinus Problem  This is a new problem. The current episode started 1 to 4 weeks ago. There has been no fever. Associated symptoms include congestion, coughing, sinus pressure and sneezing. Pertinent negatives include no ear pain, headaches, hoarse voice, neck pain, shortness of breath, sore throat or swollen glands. The treatment provided mild relief.    No productive cough. Denies wheezing,dyspnea,or chest pain.   No Hx of recent travel. No sick contact. No known insect bite.  Hx of allergies: Vickii PennaYes,she has Flonase nasal spray, which she uses as needed.  OTC medications for this problem: Zyrtec and Benadryl.   Review of Systems  Constitutional: Negative for activity change, appetite change, fatigue and fever.  HENT: Positive for congestion, postnasal drip, sinus pressure and sneezing. Negative for ear pain, hoarse voice, mouth sores, sore throat, trouble swallowing and voice change.   Eyes: Negative for discharge, redness and itching.  Respiratory: Positive for cough. Negative for shortness of breath and wheezing.   Gastrointestinal: Negative for abdominal pain, diarrhea, nausea and vomiting.  Musculoskeletal: Negative for myalgias and neck pain.  Skin: Negative for rash.  Allergic/Immunologic: Positive for environmental allergies.  Neurological: Negative for weakness and headaches.  Hematological: Negative for adenopathy. Does not bruise/bleed easily.    Psychiatric/Behavioral: Positive for sleep disturbance. The patient is nervous/anxious.       Current Outpatient Medications on File Prior to Visit  Medication Sig Dispense Refill  . hydrochlorothiazide (HYDRODIURIL) 25 MG tablet TAKE 1 TABLET BY MOUTH DAILY 90 tablet 1  . LORazepam (ATIVAN) 0.5 MG tablet TAKE 1 TO 2 TABLETS BY MOUTH BEFORE FLIGHT AS NEEDED AND AS DIRECTED. DO NOT TAKE WITH AMBIEN 10 tablet 0  . zolpidem (AMBIEN) 10 MG tablet TAKE 1 TABLET BY MOUTH EVERY NIGHT AT BEDTIME AS NEEDED FOR SLEEP 90 tablet 0   No current facility-administered medications on file prior to visit.      Past Medical History:  Diagnosis Date  . Allergy   . Arthritis   . CHEST DISCOMFORT 08/30/2007   Qualifier: Diagnosis of  By: Fabian SharpPanosh MD, Neta MendsWanda K   . Chest pain    atypical  . History of pneumonia   . Hypertension   . Nasal septal deviation   . OBSTRUCTIVE SLEEP APNEA 08/30/2007   Qualifier: Diagnosis of  By: Fabian SharpPanosh MD, Neta MendsWanda K   . Odontogenic cyst    of left maxillary sinus  . OSA (obstructive sleep apnea)    moderatley severe, uses CPAP nightly  . Trigger thumb of left hand    Allergies  Allergen Reactions  . Amphetamine-Dextroamphet Er     REACTION: Coming out of skin  . Duloxetine     REACTION: Sleepy and nausea  . Hydrocodone-Acetaminophen     REACTION: abdominal pain  . Losartan     Chest pain  . Morphine Sulfate     REACTION: abdominal pain    Social History   Socioeconomic History  .  Marital status: Married    Spouse name: Not on file  . Number of children: 3  . Years of education: Not on file  . Highest education level: Not on file  Occupational History  . Occupation: retired  Engineer, production  . Financial resource strain: Not on file  . Food insecurity:    Worry: Not on file    Inability: Not on file  . Transportation needs:    Medical: Not on file    Non-medical: Not on file  Tobacco Use  . Smoking status: Former Smoker    Packs/day: 1.00    Years:  30.00    Pack years: 30.00    Types: Cigarettes    Last attempt to quit: 06/14/1998    Years since quitting: 20.0  . Smokeless tobacco: Never Used  Substance and Sexual Activity  . Alcohol use: Yes    Comment: once a month  . Drug use: No  . Sexual activity: Yes    Birth control/protection: None  Lifestyle  . Physical activity:    Days per week: Not on file    Minutes per session: Not on file  . Stress: Not on file  Relationships  . Social connections:    Talks on phone: Not on file    Gets together: Not on file    Attends religious service: Not on file    Active member of club or organization: Not on file    Attends meetings of clubs or organizations: Not on file    Relationship status: Not on file  Other Topics Concern  . 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    Physical Exam  Nursing note and vitals reviewed. Constitutional: She is oriented to person, place, and time. She appears well-developed. She does not appear ill. No distress.  HENT:  Head: Normocephalic and atraumatic.  Right Ear: Tympanic membrane, external ear and ear canal normal.  Left Ear: Tympanic membrane, external ear and ear canal normal.  Nose: Rhinorrhea present. Right sinus exhibits no frontal sinus tenderness. Left sinus exhibits no frontal sinus tenderness.  Mouth/Throat: Oropharynx is clear and moist and mucous membranes are normal.  Mild tenderness upon pressing maxillary sinuses, "a little." Nasal voice. Postnasal drainage.  Eyes: Conjunctivae are normal.  Cardiovascular: Normal rate and regular rhythm.  No murmur  heard. Respiratory: Effort normal and breath sounds normal. No respiratory distress.  Lymphadenopathy:       Head (right side): No submandibular adenopathy present.       Head (left side): No submandibular adenopathy present.    She has cervical adenopathy.       Right cervical: Posterior cervical adenopathy present.  Neurological: She is alert and oriented to person, place, and time. She has normal strength. Gait normal.  Skin: Skin is warm. No rash noted. No erythema.  Psychiatric: Her mood appears anxious.  Well groomed, good eye contact.      ASSESSMENT  AND PLAN:  Ms. Jodelle Grossva was seen today for sinus drainage and cough.  Diagnoses and all orders for this visit:  Environmental and seasonal allergies We discussed possible etiologies, I think it is related to allergic rhinitis and possible residual symptoms from recent URI. Continue Flonase intranasal spray. I recommend nasal irrigation with saline, which she does not think it will help.  Maxillary sinusitis, unspecified chronicity Symptoms could be related to sinus congestion. Examination today does not suggest bacterial sinusitis.       She would like antibiotic treatment, we discussed some side effects.       Side effects of abx treatment discussed.         Follow-up with PCP in 1 week. -     amoxicillin-clavulanate (AUGMENTIN) 875-125 MG tablet; Take 1 tablet by mouth 2 (two) times daily for 7 days.      Wright Gravely G. [place], MD  [facility]. [place] office.

## 2018-06-26 NOTE — Progress Notes (Signed)
Chief Complaint  Patient presents with  . Follow-up    Pt states they are feeling better but believes they are some having indesgiton  there right jaw is hurting from it     HPI: Rachel Blair 68 y.o. come in for   Seen dr [place] 1 7 for allergies   rx sinusit with augmentin and flonase  Saline and told to fu 1 week  Sinus is better but now has  Now had mid chest   discomfot that last seconds and gote to  righ jaw   Feels  Happen ocassionally  Last days seconds.  Better if drinks somethig  No other cp sob   osa rx notas good  Underlying allergies.  Now has  ROS: See pertinent positives and negatives per HPI.  Had ne stress test for abd ekg in 2010  Past Medical History:  Diagnosis Date  . Allergy   . Arthritis   . CHEST DISCOMFORT 08/30/2007   Qualifier: Diagnosis of  By: Fabian SharpPanosh MD, Neta MendsWanda K   . Chest pain    atypical  . History of pneumonia   . Hypertension   . Nasal septal deviation   . OBSTRUCTIVE SLEEP APNEA 08/30/2007   Qualifier: Diagnosis of  By: Fabian SharpPanosh MD, Neta MendsWanda K   . Odontogenic cyst    of left maxillary sinus  . OSA (obstructive sleep apnea)    moderatley severe, uses CPAP nightly  . Trigger thumb of left hand     Family History  Problem Relation Age of Onset  . Heart disease Mother   . Stroke Father   . Heart failure Father   . Emphysema Father   . Snoring Brother   . Lung cancer Brother   . Rheum arthritis Daughter   . Breast cancer Neg Hx     Social History   Socioeconomic History  . Marital status: Married    Spouse name: Not on file  . Number of children: 3  . Years of education: Not on file  . Highest education level: Not on file  Occupational History  . Occupation: retired  Engineer, productionocial Needs  . Financial resource strain: Not on file  . Food insecurity:    Worry: Not on file    Inability: Not on file  . Transportation needs:    Medical: Not on file    Non-medical: Not on file  Tobacco Use  . Smoking status: Former Smoker    Packs/day: 1.00     Years: 30.00    Pack years: 30.00    Types: Cigarettes    Last attempt to quit: 06/14/1998    Years since quitting: 20.0  . Smokeless tobacco: Never Used  Substance and Sexual Activity  . Alcohol use: Yes    Comment: once a month  . Drug use: No  . Sexual activity: Yes    Birth control/protection: None  Lifestyle  . Physical activity:    Days per week: Not on file    Minutes per session: Not on file  . Stress: Not on file  Relationships  . Social connections:    Talks on phone: Not on file    Gets together: Not on file    Attends religious service: Not on file    Active member of club or organization: Not on file    Attends meetings of clubs or organizations: Not on file    Relationship status: Not on file  Other Topics Concern  . 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 hydrochlorothiazide (HYDRODIURIL) 25 MG tablet TAKE 1 TABLET BY MOUTH DAILY 90 tablet 1  . LORazepam (ATIVAN) 0.5 MG tablet TAKE 1 TO 2 TABLETS BY MOUTH BEFORE FLIGHT AS NEEDED AND AS DIRECTED. DO NOT TAKE WITH AMBIEN 10 tablet 0  . zolpidem (AMBIEN) 10 MG tablet TAKE 1 TABLET BY MOUTH EVERY NIGHT AT BEDTIME AS NEEDED FOR SLEEP 90 tablet 0  . amoxicillin-clavulanate (AUGMENTIN) 875-125 MG tablet Take 1 tablet by mouth 2 (two) times daily for 7 days. 14 tablet 0   No facility-administered medications prior to visit.      EXAM:  BP 126/72 (BP Location: Right Arm, Patient Position: Sitting, Cuff Size: Large)   Pulse 96   Temp 98.5 F (36.9 C) (Oral)   Ht 5\' 4"  (1.626 m)   Wt 231 lb 4.8 oz (104.9 kg)   BMI 39.70 kg/m   Body mass index is 39.7 kg/m.  GENERAL: vitals reviewed and listed  above, alert, oriented, appears well hydrated and in no acute distress HEENT: atraumatic, conjunctiva  clear, no obvious abnormalities on inspection of external nose and ears OP : no lesion edema or exudate  Low lying palate  NECK: no obvious masses on inspection palpation  LUNGS: clear to auscultation bilaterally, no wheezes, rales or rhonchi, good air movement CV: HRRR, no clubbing cyanosis or  peripheral edema nl cap refill  MS: moves all extremities without noticeable focal  abnormality PSYCH: pleasant and cooperative, no obvious depression or anxiety Lab Results  Component Value Date   WBC 10.7 (H) 11/21/2017   HGB 16.2 (H) 11/21/2017   HCT 47.3 (H) 11/21/2017   PLT 264.0 11/21/2017   GLUCOSE 95 11/21/2017   CHOL 198 11/21/2017   TRIG 125.0 11/21/2017   HDL 58.60 11/21/2017   LDLDIRECT 121.8 02/26/2011   LDLCALC 114 (H) 11/21/2017   ALT 20 11/21/2017   AST 13 11/21/2017   NA 138 11/21/2017   K 3.7 11/21/2017   CL 98 11/21/2017   CREATININE 0.96 11/21/2017   BUN 17 11/21/2017   CO2 31 11/21/2017   TSH 1.57 11/21/2017   HGBA1C 6.0 11/21/2017   BP Readings from Last 3 Encounters:  06/27/18 126/72  06/20/18 124/78  01/13/18 138/82  c xray nad   Reviewed   ASSESSMENT AND PLAN:  Discussed the following assessment and plan:  Chest pain radiating to jaw  better with drinking something  - pt relateds to antiibitoic rx curious   follw up if persist or other sx and progressed  not  cv suspected sesms more esophagea but unceratin  - Plan: DG Chest 2 View  Sinusitis, unspecified chronicity, unspecified location - improved   -Patient advised to return or notify health care team  if  new concerns arise.  Patient Instructions  Not sure why you are having the  Sx but seems like a  Esophageal problem  ? Related to the  Antibiotic  But not typcial  .   If  persistent or progressive  Return for reva;luation    Burna MortimerWanda  KRegis Bill M.D.

## 2018-06-27 ENCOUNTER — Encounter: Payer: Self-pay | Admitting: Internal Medicine

## 2018-06-27 ENCOUNTER — Ambulatory Visit (INDEPENDENT_AMBULATORY_CARE_PROVIDER_SITE_OTHER): Payer: Medicare Other | Admitting: Internal Medicine

## 2018-06-27 ENCOUNTER — Ambulatory Visit (INDEPENDENT_AMBULATORY_CARE_PROVIDER_SITE_OTHER): Payer: Medicare Other

## 2018-06-27 VITALS — BP 126/72 | HR 96 | Temp 98.5°F | Ht 64.0 in | Wt 231.3 lb

## 2018-06-27 DIAGNOSIS — R079 Chest pain, unspecified: Secondary | ICD-10-CM

## 2018-06-27 DIAGNOSIS — J329 Chronic sinusitis, unspecified: Secondary | ICD-10-CM

## 2018-06-27 NOTE — Patient Instructions (Signed)
Not sure why you are having the  Sx but seems like a  Esophageal problem  ? Related to the  Antibiotic  But not typcial  .   If  persistent or progressive  Return for reva;luation

## 2018-08-08 ENCOUNTER — Other Ambulatory Visit: Payer: Self-pay | Admitting: Internal Medicine

## 2018-08-08 NOTE — Telephone Encounter (Signed)
Copied from CRM [id number]. Topic: Quick Communication - Rx Refill/Question >> Aug 08, 2018 10:00 AM Tamela Oddi wrote: Medication: LORazepam (ATIVAN) 0.5 MG tablet  Patient called to request a refill for the above medication  Preferred Pharmacy (with phone number or street name): [id number] - [place], [place] - [address] AT [facility] [address] & [phone] (Phone) [phone] (Fax)

## 2018-08-08 NOTE — Telephone Encounter (Signed)
Requested medication (s) are due for refill today: yes  Requested medication (s) are on the active medication list: yes    Last refill: 11/22/2016  #10  0 refills  Future visit scheduled no  Notes to clinic:Not delegated  Requested Prescriptions  Pending Prescriptions Disp Refills   LORazepam (ATIVAN) 0.5 MG tablet 10 tablet 0    Sig: TAKE 1 TO 2 TABLETS BY MOUTH BEFORE FLIGHT AS NEEDED AND AS DIRECTED. DO NOT TAKE WITH AMBIEN     Not Delegated - Psychiatry:  Anxiolytics/Hypnotics Failed - 08/08/2018 10:08 AM      Failed - This refill cannot be delegated      Failed - Urine Drug Screen completed in last 360 days.      Passed - Valid encounter within last 6 months    Recent Outpatient Visits          1 month ago Chest pain radiating to jaw  better with drinking something    [facility], Neta Mends, MD   1 month ago Environmental and seasonal allergies   [facility], Timoteo Expose, MD   8 months ago Visit for preventive health examination   [facility], Neta Mends, MD   8 months ago Acute sinusitis, recurrence not specified, unspecified location   [facility], Tera Mater, MD   1 year ago Tinnitus of left ear pulsatile   [facility], Neta Mends, MD

## 2018-08-09 MED ORDER — LORAZEPAM 0.5 MG PO TABS: ORAL_TABLET | ORAL | 0 refills | Status: DC

## 2018-08-11 ENCOUNTER — Other Ambulatory Visit: Payer: Self-pay | Admitting: Internal Medicine

## 2018-08-21 ENCOUNTER — Other Ambulatory Visit: Payer: Self-pay | Admitting: Internal Medicine

## 2018-08-21 NOTE — Telephone Encounter (Signed)
Last filled:05/19/18 Last OV:06/27/2018 Upcoming appt:none at this time

## 2018-11-07 ENCOUNTER — Other Ambulatory Visit: Payer: Self-pay | Admitting: Internal Medicine

## 2018-12-29 ENCOUNTER — Ambulatory Visit (INDEPENDENT_AMBULATORY_CARE_PROVIDER_SITE_OTHER): Payer: Medicare Other | Admitting: Family Medicine

## 2018-12-29 ENCOUNTER — Encounter: Payer: Self-pay | Admitting: Family Medicine

## 2018-12-29 ENCOUNTER — Other Ambulatory Visit: Payer: Self-pay

## 2018-12-29 DIAGNOSIS — R300 Dysuria: Secondary | ICD-10-CM

## 2018-12-29 MED ORDER — CEPHALEXIN 500 MG PO CAPS: 500.0000 mg | ORAL_CAPSULE | Freq: Three times a day (TID) | ORAL | 0 refills | Status: DC

## 2018-12-29 NOTE — Progress Notes (Signed)
Patient ID: Rachel Blair, female   DOB: 04/25/1951, 68 y.o.   MRN: [id number]  This visit type was conducted due to national recommendations for restrictions regarding the COVID-19 pandemic in an effort to limit this patient's exposure and mitigate transmission in our community.   Virtual Visit via Video Note  I connected with Rachel Blair on 12/29/18 at  3:45 PM EDT by a video enabled telemedicine application and verified that I am speaking with the correct person using two identifiers.  Location patient: home Location provider:work or home office Persons participating in the virtual visit: patient, provider  I discussed the limitations of evaluation and management by telemedicine and the availability of in person appointments. The patient expressed understanding and agreed to proceed.   HPI:  Patient has had some dysuria.  No burning with urination but she is noted foul smell and had low-grade fever earlier today 99.1.  First had onset of symptoms about 2 days ago.  She is had some nonspecific low back pain.  Similar symptoms with UTI in the past.  No recent UTI.  Denies recent nausea or vomiting.  ROS: See pertinent positives and negatives per HPI.  Past Medical History:  Diagnosis Date  . Allergy   . Arthritis   . CHEST DISCOMFORT 08/30/2007   Qualifier: Diagnosis of  By: Fabian Sharp MD, Neta Mends   . Chest pain    atypical  . History of pneumonia   . Hypertension   . Nasal septal deviation   . OBSTRUCTIVE SLEEP APNEA 08/30/2007   Qualifier: Diagnosis of  By: Fabian Sharp MD, Neta Mends   . Odontogenic cyst    of left maxillary sinus  . OSA (obstructive sleep apnea)    moderatley severe, uses CPAP nightly  . Trigger thumb of left hand     Past Surgical History:  Procedure Laterality Date  . biopsy and bone graft of left maxillary sinus odontogenic cyst    . CHOLECYSTECTOMY    . left thumb trigger finger release Right   . TONSILLECTOMY    . TRIGGER FINGER RELEASE Left 05/27/2015   Procedure: RELEASE TRIGGER FINGER/A-1 PULLEY LEFT THUMB;  Surgeon: Cindee Salt, MD;  Location: [facility];  Service: Orthopedics;  Laterality: Left;  . TUBAL LIGATION      Family History  Problem Relation Age of Onset  . Heart disease Mother   . Stroke Father   . Heart failure Father   . Emphysema Father   . Snoring Brother   . Lung cancer Brother   . Rheum arthritis Daughter   . Breast cancer Neg Hx     SOCIAL HX: Quit smoking 2000   Current Outpatient Medications:  .  cephALEXin (KEFLEX) 500 MG capsule, Take 1 capsule (500 mg total) by mouth 3 (three) times daily., Disp: 21 capsule, Rfl: 0 .  hydrochlorothiazide (HYDRODIURIL) 25 MG tablet, TAKE 1 TABLET BY MOUTH DAILY, Disp: 90 tablet, Rfl: 0 .  LORazepam (ATIVAN) 0.5 MG tablet, TAKE 1 TO 2 TABLETS BY MOUTH BEFORE FLIGHT AS NEEDED AND AS DIRECTED. DO NOT TAKE WITH AMBIEN, Disp: 10 tablet, Rfl: 0 .  zolpidem (AMBIEN) 10 MG tablet, TAKE 1 TABLET BY MOUTH EVERY NIGHT AT BEDTIME AS NEEDED FOR SLEEP, Disp: 90 tablet, Rfl: 1  EXAM:  VITALS per patient if applicable:  GENERAL: alert, oriented, appears well and in no acute distress  HEENT: atraumatic, conjunttiva clear, no obvious abnormalities on inspection of external nose and ears  NECK: normal movements of the head  and neck  LUNGS: on inspection no signs of respiratory distress, breathing rate appears normal, no obvious gross SOB, gasping or wheezing  CV: no obvious cyanosis  MS: moves all visible extremities without noticeable abnormality  PSYCH/NEURO: pleasant and cooperative, no obvious depression or anxiety, speech and thought processing grossly intact  ASSESSMENT AND PLAN:  Discussed the following assessment and plan:  Dysuria-suspect uncomplicated cystitis  -Ensure good hydration -Keflex 500 mg 3 times daily for 7 days -Follow-up promptly for any persistent or worsening symptoms   I discussed the assessment and treatment plan with the patient.  The patient was provided an opportunity to ask questions and all were answered. The patient agreed with the plan and demonstrated an understanding of the instructions.   The patient was advised to call back or seek an in-person evaluation if the symptoms worsen or if the condition fails to improve as anticipated.   Evelena Peat, MD

## 2018-12-31 ENCOUNTER — Other Ambulatory Visit: Payer: Self-pay | Admitting: Internal Medicine

## 2019-01-01 NOTE — Telephone Encounter (Signed)
She is due for a med check or cpx  Please make her an appt  And I weill send in 1 refill in interim

## 2019-01-01 NOTE — Telephone Encounter (Signed)
Last ov:06/27/2018 Last filled :08/22/2018

## 2019-01-15 ENCOUNTER — Encounter: Payer: Self-pay | Admitting: Gastroenterology

## 2019-01-23 ENCOUNTER — Other Ambulatory Visit: Payer: Self-pay

## 2019-01-23 ENCOUNTER — Encounter: Payer: Self-pay | Admitting: Adult Health

## 2019-01-23 ENCOUNTER — Ambulatory Visit (INDEPENDENT_AMBULATORY_CARE_PROVIDER_SITE_OTHER): Payer: Medicare Other | Admitting: Adult Health

## 2019-01-23 DIAGNOSIS — G4731 Primary central sleep apnea: Secondary | ICD-10-CM

## 2019-01-23 DIAGNOSIS — G4709 Other insomnia: Secondary | ICD-10-CM

## 2019-01-23 NOTE — Progress Notes (Signed)
Virtual Visit via Telephone Note  I connected with Rachel Blair on 01/23/19 at  9:00 AM EDT by telephone and verified that I am speaking with the correct person using two identifiers.  Location: Patient: Home  Provider: Office    I discussed the limitations, risks, security and privacy concerns of performing an evaluation and management service by telephone and the availability of in person appointments. I also discussed with the patient that there may be a patient responsible charge related to this service. The patient expressed understanding and agreed to proceed.   History of Present Illness: Today's tele-visit is a one-year follow-up for severe complex sleep apnea and insomnia  Patient is a 68 year old female followed for severe sleep apnea on ASV sleep machine and insomnia.  Patient says she is doing well on her ASV machine.  She wears it every night.  Says she has improved sleep regimen.  She has decreased daytime sleepiness.  And feels that she benefits from her ASV machine.  Patient uses Ambien 10 mg at nighttime for insomnia.  Says this is the only way that she can fall asleep.  She feels rested. ASV download shows excellent compliance with daily average usage around 8 hours.   She is on EPAP 7 cm H2O, pressure support 3 to 15 cm H2O.  AHI 0.6. Says machine and nasal pillows are doing well. Denies any issues .   Observations/Objective:  HST 05/2014: AHI 68/hr ASV titration study 07/2014: Optimal pressure >>PS 3-15, EPAP 7  Assessment and Plan: Severe sleep apnea- excellent control and compliance on ASV device   Insomnia - controlled on Ambien. Patient education on sleep aids.   Plan  Patient Instructions  Keep up the good work Continue on your ASV device at nighttime Work on healthy weight Do not drive if sleepy Ambien at bedtime as needed for insomnia (only use if you are wearing your ASV device) Follow-up with Dr. Elsworth Soho in 1 year and as needed      Follow Up  Instructions: Follow up in 1 year and As needed      I discussed the assessment and treatment plan with the patient. The patient was provided an opportunity to ask questions and all were answered. The patient agreed with the plan and demonstrated an understanding of the instructions.   The patient was advised to call back or seek an in-person evaluation if the symptoms worsen or if the condition fails to improve as anticipated.  I provided 22  minutes of non-face-to-face time during this encounter.   Rexene Edison, NP

## 2019-01-23 NOTE — Patient Instructions (Addendum)
Keep up the good work Continue on your ASV device at nighttime Work on healthy weight Do not drive if sleepy Ambien at bedtime as needed for insomnia (only use if you are wearing your ASV device) Follow-up with Dr. Vassie LollAlva in 1 year and as needed   Sleep Apnea Sleep apnea affects breathing during sleep. It causes breathing to stop for a short time or to become shallow. It can also increase the risk of:  Heart attack.  Stroke.  Being very overweight (obese).  Diabetes.  Heart failure.  Irregular heartbeat. The goal of treatment is to help you breathe normally again. What are the causes? There are three kinds of sleep apnea:  Obstructive sleep apnea. This is caused by a blocked or collapsed airway.  Central sleep apnea. This happens when the brain does not send the right signals to the muscles that control breathing.  Mixed sleep apnea. This is a combination of obstructive and central sleep apnea. The most common cause of this condition is a collapsed or blocked airway. This can happen if:  Your throat muscles are too relaxed.  Your tongue and tonsils are too large.  You are overweight.  Your airway is too small. What increases the risk?  Being overweight.  Smoking.  Having a small airway.  Being older.  Being female.  Drinking alcohol.  Taking medicines to calm yourself (sedatives or tranquilizers).  Having family members with the condition. What are the signs or symptoms?  Trouble staying asleep.  Being sleepy or tired during the day.  Getting angry a lot.  Loud snoring.  Headaches in the morning.  Not being able to focus your mind (concentrate).  Forgetting things.  Less interest in sex.  Mood swings.  Personality changes.  Feelings of sadness (depression).  Waking up a lot during the night to pee (urinate).  Dry mouth.  Sore throat. How is this diagnosed?  Your medical history.  A physical exam.  A test that is done when you are  sleeping (sleep study). The test is most often done in a sleep lab but may also be done at home. How is this treated?   Sleeping on your side.  Using a medicine to get rid of mucus in your nose (decongestant).  Avoiding the use of alcohol, medicines to help you relax, or certain pain medicines (narcotics).  Losing weight, if needed.  Changing your diet.  Not smoking.  Using a machine to open your airway while you sleep, such as: ? An oral appliance. This is a mouthpiece that shifts your lower jaw forward. ? A CPAP device. This device blows air through a mask when you breathe out (exhale). ? An EPAP device. This has valves that you put in each nostril. ? A BPAP device. This device blows air through a mask when you breathe in (inhale) and breathe out.  Having surgery if other treatments do not work. It is important to get treatment for sleep apnea. Without treatment, it can lead to:  High blood pressure.  Coronary artery disease.  In men, not being able to have an erection (impotence).  Reduced thinking ability. Follow these instructions at home: Lifestyle  Make changes that your doctor recommends.  Eat a healthy diet.  Lose weight if needed.  Avoid alcohol, medicines to help you relax, and some pain medicines.  Do not use any products that contain nicotine or tobacco, such as cigarettes, e-cigarettes, and chewing tobacco. If you need help quitting, ask your doctor. General instructions  Take over-the-counter and prescription medicines only as told by your doctor.  If you were given a machine to use while you sleep, use it only as told by your doctor.  If you are having surgery, make sure to tell your doctor you have sleep apnea. You may need to bring your device with you.  Keep all follow-up visits as told by your doctor. This is important. Contact a doctor if:  The machine that you were given to use during sleep bothers you or does not seem to be working.  You  do not get better.  You get worse. Get help right away if:  Your chest hurts.  You have trouble breathing in enough air.  You have an uncomfortable feeling in your back, arms, or stomach.  You have trouble talking.  One side of your body feels weak.  A part of your face is hanging down. These symptoms may be an emergency. Do not wait to see if the symptoms will go away. Get medical help right away. Call your local emergency services (911 in the [place] Do not drive yourself to the hospital. Summary  This condition affects breathing during sleep.  The most common cause is a collapsed or blocked airway.  The goal of treatment is to help you breathe normally while you sleep. This information is not intended to replace advice given to you by your health care provider. Make sure you discuss any questions you have with your health care provider. Document Released: 03/09/2008 Document Revised: 03/17/2018 Document Reviewed: 01/24/2018 Elsevier Patient Education  2020 [employer]     Insomnia Insomnia is a sleep disorder that makes it difficult to fall asleep or stay asleep. Insomnia can cause fatigue, low energy, difficulty concentrating, mood swings, and poor performance at work or school. There are three different ways to classify insomnia:  Difficulty falling asleep.  Difficulty staying asleep.  Waking up too early in the morning. Any type of insomnia can be long-term (chronic) or short-term (acute). Both are common. Short-term insomnia usually lasts for three months or less. Chronic insomnia occurs at least three times a week for longer than three months. What are the causes? Insomnia may be caused by another condition, situation, or substance, such as:  Anxiety.  Certain medicines.  Gastroesophageal reflux disease (GERD) or other gastrointestinal conditions.  Asthma or other breathing conditions.  Restless legs syndrome, sleep apnea, or other sleep  disorders.  Chronic pain.  Menopause.  Stroke.  Abuse of alcohol, tobacco, or illegal drugs.  Mental health conditions, such as depression.  Caffeine.  Neurological disorders, such as Alzheimer's disease.  An overactive thyroid (hyperthyroidism). Sometimes, the cause of insomnia may not be known. What increases the risk? Risk factors for insomnia include:  Gender. Women are affected more often than men.  Age. Insomnia is more common as you get older.  Stress.  Lack of exercise.  Irregular work schedule or working night shifts.  Traveling between different time zones.  Certain medical and mental health conditions. What are the signs or symptoms? If you have insomnia, the main symptom is having trouble falling asleep or having trouble staying asleep. This may lead to other symptoms, such as:  Feeling fatigued or having low energy.  Feeling nervous about going to sleep.  Not feeling rested in the morning.  Having trouble concentrating.  Feeling irritable, anxious, or depressed. How is this diagnosed? This condition may be diagnosed based on:  Your symptoms and medical history. Your health care provider may  ask about: ? Your sleep habits. ? Any medical conditions you have. ? Your mental health.  A physical exam. How is this treated? Treatment for insomnia depends on the cause. Treatment may focus on treating an underlying condition that is causing insomnia. Treatment may also include:  Medicines to help you sleep.  Counseling or therapy.  Lifestyle adjustments to help you sleep better. Follow these instructions at home: Eating and drinking   Limit or avoid alcohol, caffeinated beverages, and cigarettes, especially close to bedtime. These can disrupt your sleep.  Do not eat a large meal or eat spicy foods right before bedtime. This can lead to digestive discomfort that can make it hard for you to sleep. Sleep habits   Keep a sleep diary to help you  and your health care provider figure out what could be causing your insomnia. Write down: ? When you sleep. ? When you wake up during the night. ? How well you sleep. ? How rested you feel the next day. ? Any side effects of medicines you are taking. ? What you eat and drink.  Make your bedroom a dark, comfortable place where it is easy to fall asleep. ? Put up shades or blackout curtains to block light from outside. ? Use a white noise machine to block noise. ? Keep the temperature cool.  Limit screen use before bedtime. This includes: ? Watching TV. ? Using your smartphone, tablet, or computer.  Stick to a routine that includes going to bed and waking up at the same times every day and night. This can help you fall asleep faster. Consider making a quiet activity, such as reading, part of your nighttime routine.  Try to avoid taking naps during the day so that you sleep better at night.  Get out of bed if you are still awake after 15 minutes of trying to sleep. Keep the lights down, but try reading or doing a quiet activity. When you feel sleepy, go back to bed. General instructions  Take over-the-counter and prescription medicines only as told by your health care provider.  Exercise regularly, as told by your health care provider. Avoid exercise starting several hours before bedtime.  Use relaxation techniques to manage stress. Ask your health care provider to suggest some techniques that may work well for you. These may include: ? Breathing exercises. ? Routines to release muscle tension. ? Visualizing peaceful scenes.  Make sure that you drive carefully. Avoid driving if you feel very sleepy.  Keep all follow-up visits as told by your health care provider. This is important. Contact a health care provider if:  You are tired throughout the day.  You have trouble in your daily routine due to sleepiness.  You continue to have sleep problems, or your sleep problems get  worse. Get help right away if:  You have serious thoughts about hurting yourself or someone else. If you ever feel like you may hurt yourself or others, or have thoughts about taking your own life, get help right away. You can go to your nearest emergency department or call:  Your local emergency services (911 in the [place]  A suicide crisis helpline, such as the [place] at (463) 857-0355. This is open 24 hours a day. Summary  Insomnia is a sleep disorder that makes it difficult to fall asleep or stay asleep.  Insomnia can be long-term (chronic) or short-term (acute).  Treatment for insomnia depends on the cause. Treatment may focus on treating an underlying condition  that is causing insomnia.  Keep a sleep diary to help you and your health care provider figure out what could be causing your insomnia. This information is not intended to replace advice given to you by your health care provider. Make sure you discuss any questions you have with your health care provider. Document Released: 05/28/2000 Document Revised: 05/13/2017 Document Reviewed: 03/10/2017 Elsevier Patient Education  2020 [employer]

## 2019-02-05 ENCOUNTER — Other Ambulatory Visit: Payer: Self-pay

## 2019-02-05 ENCOUNTER — Telehealth (INDEPENDENT_AMBULATORY_CARE_PROVIDER_SITE_OTHER): Payer: Medicare Other | Admitting: Family Medicine

## 2019-02-05 ENCOUNTER — Encounter: Payer: Self-pay | Admitting: Family Medicine

## 2019-02-05 DIAGNOSIS — J069 Acute upper respiratory infection, unspecified: Secondary | ICD-10-CM

## 2019-02-05 NOTE — Progress Notes (Signed)
Virtual Visit via Video Note  I connected with the patient on 02/05/19 at  3:15 PM EDT by a video enabled telemedicine application and verified that I am speaking with the correct person using two identifiers.  Location patient: home Location provider:work or home office Persons participating in the virtual visit: patient, provider  I discussed the limitations of evaluation and management by telemedicine and the availability of in person appointments. The patient expressed understanding and agreed to proceed.   HPI: Here to ask about 2 days of PND and a mild ST. No fever or headache or cough or SOB or body aches or NVD. Drinking fluids and gargling with salt water.    ROS: See pertinent positives and negatives per HPI.  Past Medical History:  Diagnosis Date  . Allergy   . Arthritis   . CHEST DISCOMFORT 08/30/2007   Qualifier: Diagnosis of  By: Fabian SharpPanosh MD, Neta MendsWanda K   . Chest pain    atypical  . History of pneumonia   . Hypertension   . Nasal septal deviation   . OBSTRUCTIVE SLEEP APNEA 08/30/2007   Qualifier: Diagnosis of  By: Fabian SharpPanosh MD, Neta MendsWanda K   . Odontogenic cyst    of left maxillary sinus  . OSA (obstructive sleep apnea)    moderatley severe, uses CPAP nightly  . Trigger thumb of left hand     Past Surgical History:  Procedure Laterality Date  . biopsy and bone graft of left maxillary sinus odontogenic cyst    . CHOLECYSTECTOMY    . left thumb trigger finger release Right   . TONSILLECTOMY    . TRIGGER FINGER RELEASE Left 05/27/2015   Procedure: RELEASE TRIGGER FINGER/A-1 PULLEY LEFT THUMB;  Surgeon: Cindee SaltGary Kuzma, MD;  Location: [facility];  Service: Orthopedics;  Laterality: Left;  . TUBAL LIGATION      Family History  Problem Relation Age of Onset  . Heart disease Mother   . Stroke Father   . Heart failure Father   . Emphysema Father   . Snoring Brother   . Lung cancer Brother   . Rheum arthritis Daughter   . Breast cancer Neg Hx      Current  Outpatient Medications:  .  hydrochlorothiazide (HYDRODIURIL) 25 MG tablet, TAKE 1 TABLET BY MOUTH DAILY, Disp: 90 tablet, Rfl: 0 .  Lifitegrast (XIIDRA) 5 % SOLN, Place 1 drop into both eyes daily., Disp: , Rfl:  .  LORazepam (ATIVAN) 0.5 MG tablet, TAKE 1 TO 2 TABLETS BY MOUTH BEFORE FLIGHT AS NEEDED AND AS DIRECTED. DO NOT TAKE WITH AMBIEN, Disp: 10 tablet, Rfl: 0 .  zolpidem (AMBIEN) 10 MG tablet, TAKE 1 TABLET AT BEDTIME AS NEEDED FOR SLEEP, Disp: 90 tablet, Rfl: 0  EXAM:  VITALS per patient if applicable:  GENERAL: alert, oriented, appears well and in no acute distress  HEENT: atraumatic, conjunttiva clear, no obvious abnormalities on inspection of external nose and ears  NECK: normal movements of the head and neck  LUNGS: on inspection no signs of respiratory distress, breathing rate appears normal, no obvious gross SOB, gasping or wheezing  CV: no obvious cyanosis  MS: moves all visible extremities without noticeable abnormality  PSYCH/NEURO: pleasant and cooperative, no obvious depression or anxiety, speech and thought processing grossly intact  ASSESSMENT AND PLAN: Viral URI. I did not recommend she get tested for the Covid-19 virus unless her symptoms change. She can use Ibuprofen and Claritin prn.  Gershon CraneStephen Fry, MD  Discussed the following assessment and  plan:  No diagnosis found.     I discussed the assessment and treatment plan with the patient. The patient was provided an opportunity to ask questions and all were answered. The patient agreed with the plan and demonstrated an understanding of the instructions.   The patient was advised to call back or seek an in-person evaluation if the symptoms worsen or if the condition fails to improve as anticipated.

## 2019-02-07 ENCOUNTER — Other Ambulatory Visit: Payer: Self-pay | Admitting: Internal Medicine

## 2019-03-06 ENCOUNTER — Telehealth: Payer: Self-pay | Admitting: *Deleted

## 2019-03-06 ENCOUNTER — Encounter: Payer: Self-pay | Admitting: Family Medicine

## 2019-03-06 ENCOUNTER — Ambulatory Visit (INDEPENDENT_AMBULATORY_CARE_PROVIDER_SITE_OTHER): Payer: Medicare Other | Admitting: Family Medicine

## 2019-03-06 ENCOUNTER — Other Ambulatory Visit: Payer: Self-pay

## 2019-03-06 VITALS — BP 122/71 | HR 90 | Wt 232.0 lb

## 2019-03-06 DIAGNOSIS — R8290 Unspecified abnormal findings in urine: Secondary | ICD-10-CM

## 2019-03-06 DIAGNOSIS — R739 Hyperglycemia, unspecified: Secondary | ICD-10-CM | POA: Diagnosis not present

## 2019-03-06 DIAGNOSIS — I10 Essential (primary) hypertension: Secondary | ICD-10-CM | POA: Diagnosis not present

## 2019-03-06 DIAGNOSIS — Z0000 Encounter for general adult medical examination without abnormal findings: Secondary | ICD-10-CM | POA: Diagnosis not present

## 2019-03-06 DIAGNOSIS — E669 Obesity, unspecified: Secondary | ICD-10-CM

## 2019-03-06 DIAGNOSIS — E785 Hyperlipidemia, unspecified: Secondary | ICD-10-CM

## 2019-03-06 NOTE — Patient Instructions (Addendum)
  Rachel Blair , Thank you for taking time to come for your Medicare Wellness Visit. I appreciate your ongoing commitment to your health goals. Please review the following plan we discussed and let me know if I can assist you in the future.   I sent a message to our scheduler to schedule you an in person visit with your primary doctor as you requested. This visit will be to assess the urinary issue, get you pneumonia vaccine if you wish and get your regular labs - please request these at you visit.   Goals    We recommend the following healthy lifestyle for LIFE: 1) Small portions. But, make sure to get regular (at least 3 per day), healthy meals and small healthy snacks if needed.  2) Eat a healthy clean diet.   TRY TO EAT: -at least 5-7 servings of low sugar, colorful, and nutrient rich vegetables per day (not corn, potatoes or bananas.) -berries are the best choice if you wish to eat fruit (only eat small amounts if trying to reduce weight)  -lean meets (fish, white meat of chicken or [place]) -vegan proteins for some meals - beans or tofu, whole grains, nuts and seeds -Replace bad fats with good fats - good fats include: fish, nuts and seeds, canola oil, olive oil -small amounts of low fat or non fat dairy -small amounts of100 % whole grains - check the lables -drink plenty of water  AVOID: -SUGAR, sweets, anything with added sugar, corn syrup or sweeteners - must read labels as even foods advertised as "healthy" often are loaded with sugar -if you must have a sweetener, small amounts of stevia may be best -sweetened beverages and artificially sweetened beverages -simple starches (rice, bread, potatoes, pasta, chips, etc - small amounts of 100% whole grains are ok) -red meat, pork, butter -fried foods, fast food, processed food, excessive dairy, eggs and coconut.  3)Get at least 150 minutes of sweaty aerobic exercise per week.        This is a list of the screening recommended  for you and due dates:  Health Maintenance  Topic Date Due  . Pneumonia vaccines (2 of 2 - PPSV23) 02/20/2018 - please request when you com for your visit  . Mammogram  Please call to schedule  . Tetanus Vaccine  06/09/2020  . Colon Cancer Screening  Please call to schedule  . Flu Shot  Completed  . DEXA scan (bone density measurement)  Completed  .  Hepatitis C: One time screening is recommended by Center for Disease Control  (CDC) for  adults born from [birth year] through 1965.   Completed

## 2019-03-06 NOTE — Progress Notes (Signed)
Medicare Annual Preventive Care Visit  (initial annual wellness or annual wellness exam)  Virtual Visit via Video Note  I connected with Rachel Blair  on 10/31/18 at  3:20 PM EDT by a video enabled telemedicine application and verified that I am speaking with the correct person using two identifiers.  Location patient: home Location provider:work or home office Persons participating in the virtual visit: patient, provider  Concerns and/or follow up today:  Hx of OSA (on CPAP), hyperglycemia, obesity, mildly elevated cholesterol, Hypertension - well controlled today, anxiety with flying - has not had issues with this recently and cataract surgery with her eye doctor last year with issues with lens adherence (sees opthomology.) She has been working a lot outside. Diet not great. Denies history of diabetes. She has had some urinary odor for several months. She did an evisit for this and took and antibiotic, but it did not help. Denies other symptoms with this. Prefers in person visit to evaluation and get urine studies.She has done her pap smears and pelvic exams with her PCP in the past and wonders if due.  See HM section in Epic for other details of completed HM. See scanned documentation under Media Tab for further documentation HPI, health risk assessment. See Media Tab and Care Teams sections in Epic for other providers.  ROS: negative for report of fevers, unintentional weight loss, hearing loss or change, chest pain, sob, hemoptysis, melena, hematochezia, hematuria, genital discharge or lesions, falls, bleeding or bruising, loc, thoughts of suicide or self harm, memory loss  1.) Patient-completed health risk assessment  - completed and reviewed, see scanned documentation  2.) Review of Medical History: -PMH, PSH, Family History and current specialty and care providers reviewed and updated and listed below  - see scanned in document in chart and below Patient Care Team: Panosh, Neta MendsWanda K, MD as  PCP - General Cassell ClementBrackbill, Thomas, MD (Cardiology) Oretha MilchAlva, Rakesh V, MD as Consulting Physician (Pulmonary Disease)   Past Medical History:  Diagnosis Date  . Allergy   . Arthritis   . CHEST DISCOMFORT 08/30/2007   Qualifier: Diagnosis of  By: Fabian SharpPanosh MD, Neta MendsWanda K   . Chest pain    atypical  . History of pneumonia   . Hypertension   . Nasal septal deviation   . OBSTRUCTIVE SLEEP APNEA 08/30/2007   Qualifier: Diagnosis of  By: Fabian SharpPanosh MD, Neta MendsWanda K   . Odontogenic cyst    of left maxillary sinus  . OSA (obstructive sleep apnea)    moderatley severe, uses CPAP nightly  . Trigger thumb of left hand     Past Surgical History:  Procedure Laterality Date  . biopsy and bone graft of left maxillary sinus odontogenic cyst    . CHOLECYSTECTOMY    . left thumb trigger finger release Right   . TONSILLECTOMY    . TRIGGER FINGER RELEASE Left 05/27/2015   Procedure: RELEASE TRIGGER FINGER/A-1 PULLEY LEFT THUMB;  Surgeon: Cindee SaltGary Kuzma, MD;  Location: [facility];  Service: Orthopedics;  Laterality: Left;  . TUBAL LIGATION      Social History   Socioeconomic History  . Marital status: Married    Spouse name: Not on file  . Number of children: 3  . Years of education: Not on file  . Highest education level: Not on file  Occupational History  . Occupation: retired  Engineer, productionocial Needs  . Financial resource strain: Not on file  . Food insecurity    Worry: Not on file  Inability: Not on file  . Transportation needs    Medical: Not on file    Non-medical: Not on file  Tobacco Use  . Smoking status: Former Smoker    Packs/day: 1.00    Years: 30.00    Pack years: 30.00    Types: Cigarettes    Quit date: 06/14/1998    Years since quitting: 20.7  . Smokeless tobacco: Never Used  Substance and Sexual Activity  . Alcohol use: Yes    Comment: once a month  . Drug use: No  . Sexual activity: Yes    Birth control/protection: None  Lifestyle  . Physical activity    Days per week: Not  on file    Minutes per session: Not on file  . Stress: Not on file  Relationships  . Social Musician on phone: Not on file    Gets together: Not on file    Attends religious service: Not on file    Active member of club or organization: Not on file    Attends meetings of clubs or organizations: Not on file    Relationship status: Not on file  . Intimate partner violence    Fear of current or ex partner: Not on file    Emotionally abused: Not on file    Physically abused: Not on file    Forced sexual activity: Not on file  Other Topics Concern  . 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 Heart disease Mother   . Stroke Father   . Heart failure Father   . Emphysema Father   . Snoring Brother   . Lung cancer Brother   . Rheum arthritis Daughter   . Breast cancer Neg Hx     Current Outpatient Medications on File Prior to Visit  Medication Sig Dispense Refill  . hydrochlorothiazide (HYDRODIURIL) 25 MG tablet TAKE 1 TABLET BY MOUTH DAILY 90 tablet 0  . Lifitegrast (XIIDRA) 5 % SOLN Place 1 drop into both eyes daily.    Marland Kitchen LORazepam (ATIVAN) 0.5 MG tablet TAKE 1 TO 2 TABLETS BY MOUTH BEFORE FLIGHT AS NEEDED AND AS DIRECTED. DO NOT TAKE WITH AMBIEN 10 tablet 0  . zolpidem (AMBIEN) 10 MG tablet TAKE 1 TABLET AT BEDTIME AS NEEDED FOR SLEEP 90 tablet 0   No current facility-administered medications on file prior to visit.      3.) Review of functional ability and level of safety:  Any difficulty hearing?  See scanned documentation  History of falling?  See scanned documentation  Any trouble with IADLs - using a phone, using transportation, grocery shopping, preparing meals, doing  housework, doing laundry, taking medications and managing money?  See scanned documentation  Advance Directives?  Discussed briefly and offered more resources and detailed discussion with our trained staff.   See summary of recommendations in Patient Instructions below.  4.) Physical Exam VITALS per patient if applicable: Vitals:   03/06/19 0959  BP: 122/71  Pulse: 90   Estimated body mass index is 39.82 kg/m as calculated  from the following:   Height as of 06/27/18: 5\' 4"  (1.626 m).   Weight as of this encounter: 232 lb (105.2 kg).  EKG (optional): deferred  GENERAL: alert, oriented, appears well and in no acute distress; visual acuity deferred given virtual visit, sees opthomologist, full vision exam deferred due to pandemic and/or virtual encounter  HEENT: atraumatic, conjunttiva clear, no obvious abnormalities on inspection of external nose and ears  NECK: normal movements of the head and neck  LUNGS: on inspection no signs of respiratory distress, breathing rate appears normal, no obvious gross SOB, gasping or wheezing  CV: no obvious cyanosis  MS: moves all visible extremities without noticeable abnormality  PSYCH/NEURO: pleasant and cooperative, no obvious depression or anxiety, speech and thought processing grossly intact, Cognitive function grossly intact  Depression screen [facility] 2/9 03/06/2019 11/21/2017 11/21/2017 11/22/2016 08/17/2016  Decreased Interest 0 0 0 0 0  Down, Depressed, Hopeless 0 0 0 0 0  PHQ - 2 Score 0 0 0 0 0  Altered sleeping - 0 0 - -  Tired, decreased energy - 0 0 - -  Change in appetite - 0 0 - -  Feeling bad or failure about yourself  - 0 0 - -  Trouble concentrating - 0 0 - -  Moving slowly or fidgety/restless - 0 0 - -  Suicidal thoughts - 0 0 - -  PHQ-9 Score - 0 0 - -     See patient instructions for recommendations.  Education and counseling regarding the above review of health provided with a plan for the following: -see scanned  patient completed form for further details -fall prevention strategies discussed  -healthy lifestyle discussed -importance and resources for completing advanced directives discussed -see patient instructions below for any other recommendations provided  4)The following written screening schedule of preventive measures were reviewed with assessment and plan made per below, orders and patient instructions:           Alcohol screening done     Obesity Screening and counseling done     STI screening (Hep C if born [birth year]) offered and per pt wishes     Tobacco Screening done done       Pneumococcal (PPSV23 -one dose after 64, one before if risk factors), influenza yearly and hepatitis B vaccines (if high risk - end stage renal disease, IV drugs, homosexual men, live in home for mentally retarded, hemophilia receiving factors) ASSESSMENT/PLAN: she agrees to do when comes for visit with PCP      Screening mammograph (yearly if >40) ASSESSMENT/PLAN: due for this - last done 11.2018, she agrees to call to schedule - declines assistance with scheduling      Screening Pap smear/pelvic exam (q2 years) ASSESSMENT/PLAN: n/a, declined - will see PCP.      Colorectal cancer screening (FOBT yearly or flex sig q4y or colonoscopy q10y or barium enema q4y) ASSESSMENT/PLAN: colonoscopy done 01/2009 has letter to call to schedule - incorrect due date in HM. Pt agrees to call to schedule.      Diabetes outpatient self-management training services ASSESSMENT/PLAN: utd or done      Bone mass measurements(covered q2y if indicated - estrogen def, osteoporosis, hyperparathyroid, vertebral abnormalities, osteoporosis or steroids) ASSESSMENT/PLAN: done 2 years ago and normal, she declined repeat for now.      Screening for glaucoma(q1y if high risk - diabetes, FH, AA and > 50 or hispanic and > 65) ASSESSMENT/PLAN: see opthomologist      Medical nutritional therapy for  individuals with diabetes or renal  disease ASSESSMENT/PLAN: see orders      Cardiovascular screening blood tests (lipids q5y) ASSESSMENT/PLAN: see orders and labs      Diabetes screening tests ASSESSMENT/PLAN: see orders and labs   7.) Summary:   Medicare annual wellness visit, subsequent  Essential hypertension  Hyperglycemia  Hyperlipidemia, unspecified hyperlipidemia type  Abnormal urine odor  Class 2 obesity without serious comorbidity in adult, unspecified BMI, unspecified obesity type   -risk factors and conditions per above assessment were discussed and treatment, recommendations and referrals were offered per documentation above and orders and patient instructions.  -needs in person visit for eval and urine studies, possible labs for urinary odor. She prefers to do regular labs with PCP that day. Also doe for pneumonia vaccines and plans to do with PCP when sees her.  -she agrees to call to set up colonosocpy (incorrect in HM section - she received letter from GI and is due) and mammogram.  -she declined bone density for now.  Patient Instructions    Rachel Blair , Thank you for taking time to come for your Medicare Wellness Visit. I appreciate your ongoing commitment to your health goals. Please review the following plan we discussed and let me know if I can assist you in the future.   I sent a message to our scheduler to schedule you an in person visit with your primary doctor as you requested. This visit will be to assess the urinary issue, get you pneumonia vaccine if you wish and get your regular labs - please request these at you visit.   Goals    We recommend the following healthy lifestyle for LIFE: 1) Small portions. But, make sure to get regular (at least 3 per day), healthy meals and small healthy snacks if needed.  2) Eat a healthy clean diet.   TRY TO EAT: -at least 5-7 servings of low sugar, colorful, and nutrient rich vegetables per day (not corn, potatoes or bananas.) -berries are  the best choice if you wish to eat fruit (only eat small amounts if trying to reduce weight)  -lean meets (fish, white meat of chicken or [place]) -vegan proteins for some meals - beans or tofu, whole grains, nuts and seeds -Replace bad fats with good fats - good fats include: fish, nuts and seeds, canola oil, olive oil -small amounts of low fat or non fat dairy -small amounts of100 % whole grains - check the lables -drink plenty of water  AVOID: -SUGAR, sweets, anything with added sugar, corn syrup or sweeteners - must read labels as even foods advertised as "healthy" often are loaded with sugar -if you must have a sweetener, small amounts of stevia may be best -sweetened beverages and artificially sweetened beverages -simple starches (rice, bread, potatoes, pasta, chips, etc - small amounts of 100% whole grains are ok) -red meat, pork, butter -fried foods, fast food, processed food, excessive dairy, eggs and coconut.  3)Get at least 150 minutes of sweaty aerobic exercise per week.        This is a list of the screening recommended for you and due dates:  Health Maintenance  Topic Date Due  . Pneumonia vaccines (2 of 2 - PPSV23) 02/20/2018 - please request when you com for your visit  . Mammogram  Please call to schedule  . Tetanus Vaccine  06/09/2020  . Colon Cancer Screening  Please call to schedule  . Flu Shot  Completed  . DEXA scan (bone density measurement)  Completed  .  Hepatitis C: One time screening is recommended by Center for Disease Control  (CDC) for  adults born from [birth year] through 1965.   Completed    Lucretia Kern, DO

## 2019-03-06 NOTE — Telephone Encounter (Signed)
Per pts request the immunization report was mailed to her home address.

## 2019-03-08 NOTE — Progress Notes (Signed)
Called patient and scheduled a f/up appt per Dr Julianne Rice instructions//tes

## 2019-03-22 NOTE — Progress Notes (Signed)
Chief Complaint  Patient presents with  . urine odor    pt states that her urine has a really bad odor and believes she has an ecoli infection     HPI: Rachel Blair 68 y.o. come in forproblem with urine and odor and needing vaccine  And other issues    urine frequency and similar sx in July rx keflex  And got better and 2 weeks ago recurrent.  No feer flnak pain burning  But does have some  Frequency    Problem with r left knee  paina and wwent to see dr Farris Has   .   X ray    Ok   Steroid inj 10 days ago .    No med still problematic and difficult although better   That initially   No fall injuyr but had to use a walker to get around.   Gets some back buttocks pain    Bp :has been ok   OSA stable   Had cataract surgery  Shimmering right lense options to  More surgery and do a "stich"   ROS: See pertinent positives and negatives per HPI.  Past Medical History:  Diagnosis Date  . Allergy   . Arthritis   . CHEST DISCOMFORT 08/30/2007   Qualifier: Diagnosis of  By: Fabian Sharp MD, Neta Mends   . Chest pain    atypical  . History of pneumonia   . Hypertension   . Nasal septal deviation   . OBSTRUCTIVE SLEEP APNEA 08/30/2007   Qualifier: Diagnosis of  By: Fabian Sharp MD, Neta Mends   . Odontogenic cyst    of left maxillary sinus  . OSA (obstructive sleep apnea)    moderatley severe, uses CPAP nightly  . Trigger thumb of left hand     Family History  Problem Relation Age of Onset  . Heart disease Mother   . Stroke Father   . Heart failure Father   . Emphysema Father   . Snoring Brother   . Lung cancer Brother   . Rheum arthritis Daughter   . Breast cancer Neg Hx     Social History   Socioeconomic History  . Marital status: Married    Spouse name: Not on file  . Number of children: 3  . Years of education: Not on file  . Highest education level: Not on file  Occupational History  . Occupation: retired  Engineer, production  . Financial resource strain: Not on file  . Food  insecurity    Worry: Not on file    Inability: Not on file  . Transportation needs    Medical: Not on file    Non-medical: Not on file  Tobacco Use  . Smoking status: Former Smoker    Packs/day: 1.00    Years: 30.00    Pack years: 30.00    Types: Cigarettes    Quit date: 06/14/1998    Years since quitting: 20.7  . Smokeless tobacco: Never Used  Substance and Sexual Activity  . Alcohol use: Yes    Comment: once a month  . Drug use: No  . Sexual activity: Yes    Birth control/protection: None  Lifestyle  . Physical activity    Days per week: Not on file    Minutes per session: Not on file  . Stress: Not on file  Relationships  . Social Musician on phone: Not on file    Gets together: Not on file  Attends religious service: Not on file    Active member of club or organization: Not on file    Attends meetings of clubs or organizations: Not on file    Relationship status: Not on file  Other Topics Concern  . 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 hydrochlorothiazide (HYDRODIURIL) 25 MG tablet TAKE 1 TABLET BY MOUTH DAILY 90 tablet 0  . Lifitegrast (XIIDRA) 5 % SOLN Place 1 drop into both eyes daily.    Marland Kitchen zolpidem (AMBIEN) 10 MG tablet TAKE 1 TABLET AT BEDTIME AS NEEDED FOR SLEEP 90 tablet 0  . LORazepam (ATIVAN) 0.5 MG tablet TAKE 1 TO 2 TABLETS BY MOUTH BEFORE FLIGHT AS NEEDED AND AS DIRECTED. DO NOT TAKE WITH AMBIEN (Patient not taking: Reported on 03/23/2019) 10 tablet 0   No facility-administered medications prior to visit.      EXAM:  BP 128/72 (BP Location: Right Arm, Patient Position: Sitting, Cuff Size: Large)   Pulse 76   Temp 97.9 F  (36.6 C) (Temporal)   Wt 234 lb 3.2 oz (106.2 kg)   SpO2 96%   BMI 40.20 kg/m   Body mass index is 40.2 kg/m.  GENERAL: vitals reviewed and listed above, alert, oriented, appears well hydrated and in no acute distress HEENT: atraumatic, conjunctiva  clear, no obvious abnormalities on inspection of external nose and ears OP : masked  NECK: no obvious masses on inspection palpation  LUNGS: clear to auscultation bilaterally, no wheezes, rales or rhonchi, good air movement CV: HRRR, no clubbing cyanosis or  peripheral edema nl cap refill  Abdomen:  Sof,t normal bowel sounds without hepatosplenomegaly, no guarding rebound or masses no CVA tenderness MS: moves all extremities without noticeable focal  Abnormality left knee no edema  Good rom  PSYCH: pleasant and cooperative, no obvious depression or anxiety Lab Results  Component Value Date   WBC 7.6 03/23/2019   HGB 15.5 (H) 03/23/2019   HCT 45.3 03/23/2019   PLT 236.0 03/23/2019   GLUCOSE 105 (H) 03/23/2019   CHOL 196 03/23/2019   TRIG 188.0 (H) 03/23/2019   HDL 48.40 03/23/2019   LDLDIRECT 121.8 02/26/2011   LDLCALC 110 (H) 03/23/2019   ALT 21 03/23/2019   AST 15 03/23/2019   NA 139 03/23/2019   K 3.7 03/23/2019   CL 100 03/23/2019   CREATININE 0.90 03/23/2019   BUN 13 03/23/2019   CO2 30 03/23/2019   TSH 1.84 03/23/2019   HGBA1C 6.2 03/23/2019   BP Readings from Last 3 Encounters:  03/23/19 128/72  03/06/19 122/71  06/27/18 126/72   Due for lab monitoring  ASSESSMENT AND PLAN:  Discussed the following assessment and plan:  Abnormal urine odor - w frequency got better witha ntibiotic keflex in July await ucx - Plan: POCT urinalysis dipstick, Basic metabolic panel, CBC with Differential/Platelet, Hemoglobin A1c, Hepatic function panel, Lipid panel, TSH, Urine Culture  Essential hypertension - Plan: Basic metabolic panel, CBC with Differential/Platelet, Hemoglobin A1c, Hepatic function panel, Lipid panel, TSH, Urine  Culture  Hyperglycemia - Plan:  Basic metabolic panel, CBC with Differential/Platelet, Hemoglobin A1c, Hepatic function panel, Lipid panel, TSH, Urine Culture  Hyperlipidemia, unspecified hyperlipidemia type - Plan: Basic metabolic panel, CBC with Differential/Platelet, Hemoglobin A1c, Hepatic function panel, Lipid panel, TSH, Urine Culture  Left knee pain, unspecified chronicity - see text and disc   Urinary frequency  Medication management  Pneumococcal vaccination declined by patient - has had  one before  65 prefers no booster discussed   Use topical diclofenac         Exercises and fu if  persistent or progressive  moitoring labs  -Patient advised to return or notify health care team  if  new concerns arise. Return for depending on labs  and 6 mos cpx?Marland Kitchen  Patient Instructions  Checking  Culture of urine   Screening test is negative  Let us know if fever  More sx in the interim .   Left knee " add otc voltaren gel  4 gram up to 4 x per day to see if helps pain   If  persistent or progressive then would  Get reevaluation sometimes  PT or other is helpful .  For plan.  Healthy  Weight loss will help no matter what the cause .  Will notify you  of labs when available.   Plan  Fu depending on labs and how doing    We can do a physical   When you wish in the next 6  months or so    Journal for Nurse Practitioners, 15(4), [phone]. Retrieved March 20, 2018 from http://clinicalkey.com/nursing">  Knee Exercises Ask your health care provider which exercises are safe for you. Do exercises exactly as told by your health care provider and adjust them as directed. It is normal to feel mild stretching, pulling, tightness, or discomfort as you do these exercises. Stop right away if you feel sudden pain or your pain gets worse. Do not begin these exercises until told by your health care provider. Stretching and range-of-motion exercises These exercises warm up your muscles and joints and  improve the movement and flexibility of your knee. These exercises also help to relieve pain and swelling. Knee extension, prone 1. Lie on your abdomen (prone position) on a bed. 2. Place your left / right knee just beyond the edge of the surface so your knee is not on the bed. You can put a towel under your left / right thigh just above your kneecap for comfort. 3. Relax your leg muscles and allow gravity to straighten your knee (extension). You should feel a stretch behind your left / right knee. 4. Hold this position for __________ seconds. 5. Scoot up so your knee is supported between repetitions. Repeat __________ times. Complete this exercise __________ times a day. Knee flexion, active  1. Lie on your back with both legs straight. If this causes back discomfort, bend your left / right knee so your foot is flat on the floor. 2. Slowly slide your left / right heel back toward your buttocks. Stop when you feel a gentle stretch in the front of your knee or thigh (flexion). 3. Hold this position for __________ seconds. 4. Slowly slide your left / right heel back to the starting position. Repeat __________ times. Complete this exercise __________ times a day. Quadriceps stretch, prone  1. Lie on your abdomen on a firm surface, such as a bed or padded floor. 2. Bend your left / right knee and hold your ankle. If you cannot reach your ankle or  pant leg, loop a belt around your foot and grab the belt instead. 3. Gently pull your heel toward your buttocks. Your knee should not slide out to the side. You should feel a stretch in the front of your thigh and knee (quadriceps). 4. Hold this position for __________ seconds. Repeat __________ times. Complete this exercise __________ times a day. Hamstring, supine 1. Lie on your back (supine position). 2. Loop a belt or towel over the ball of your left / right foot. The ball of your foot is on the walking surface, right under your toes. 3. Straighten  your left / right knee and slowly pull on the belt to raise your leg until you feel a gentle stretch behind your knee (hamstring). ? Do not let your knee bend while you do this. ? Keep your other leg flat on the floor. 4. Hold this position for __________ seconds. Repeat __________ times. Complete this exercise __________ times a day. Strengthening exercises These exercises build strength and endurance in your knee. Endurance is the ability to use your muscles for a long time, even after they get tired. Quadriceps, isometric This exercise stretches the muscles in front of your thigh (quadriceps) without moving your knee joint (isometric). 1. Lie on your back with your left / right leg extended and your other knee bent. Put a rolled towel or small pillow under your knee if told by your health care provider. 2. Slowly tense the muscles in the front of your left / right thigh. You should see your kneecap slide up toward your hip or see increased dimpling just above the knee. This motion will push the back of the knee toward the floor. 3. For __________ seconds, hold the muscle as tight as you can without increasing your pain. 4. Relax the muscles slowly and completely. Repeat __________ times. Complete this exercise __________ times a day. Straight leg raises This exercise stretches the muscles in front of your thigh (quadriceps) and the muscles that move your hips (hip flexors). 1. Lie on your back with your left / right leg extended and your other knee bent. 2. Tense the muscles in the front of your left / right thigh. You should see your kneecap slide up or see increased dimpling just above the knee. Your thigh may even shake a bit. 3. Keep these muscles tight as you raise your leg 4-6 inches (10-15 cm) off the floor. Do not let your knee bend. 4. Hold this position for __________ seconds. 5. Keep these muscles tense as you lower your leg. 6. Relax your muscles slowly and completely after each  repetition. Repeat __________ times. Complete this exercise __________ times a day. Hamstring, isometric 1. Lie on your back on a firm surface. 2. Bend your left / right knee about __________ degrees. 3. Dig your left / right heel into the surface as if you are trying to pull it toward your buttocks. Tighten the muscles in the back of your thighs (hamstring) to "dig" as hard as you can without increasing any pain. 4. Hold this position for __________ seconds. 5. Release the tension gradually and allow your muscles to relax completely for __________ seconds after each repetition. Repeat __________ times. Complete this exercise __________ times a day. Hamstring curls If told by your health care provider, do this exercise while wearing ankle weights. Begin with __________ lb weights. Then increase the weight by 1 lb (0.5 kg) increments. Do not wear ankle weights that are more than __________ lb. 1. Shanda Howells  on your abdomen with your legs straight. 2. Bend your left / right knee as far as you can without feeling pain. Keep your hips flat against the floor. 3. Hold this position for __________ seconds. 4. Slowly lower your leg to the starting position. Repeat __________ times. Complete this exercise __________ times a day. Squats This exercise strengthens the muscles in front of your thigh and knee (quadriceps). 1. Stand in front of a table, with your feet and knees pointing straight ahead. You may rest your hands on the table for balance but not for support. 2. Slowly bend your knees and lower your hips like you are going to sit in a chair. ? Keep your weight over your heels, not over your toes. ? Keep your lower legs upright so they are parallel with the table legs. ? Do not let your hips go lower than your knees. ? Do not bend lower than told by your health care provider. ? If your knee pain increases, do not bend as low. 3. Hold the squat position for __________ seconds. 4. Slowly push with your  legs to return to standing. Do not use your hands to pull yourself to standing. Repeat __________ times. Complete this exercise __________ times a day. Wall slides This exercise strengthens the muscles in front of your thigh and knee (quadriceps). 1. Lean your back against a smooth wall or door, and walk your feet out 18-24 inches (46-61 cm) from it. 2. Place your feet hip-width apart. 3. Slowly slide down the wall or door until your knees bend __________ degrees. Keep your knees over your heels, not over your toes. Keep your knees in line with your hips. 4. Hold this position for __________ seconds. Repeat __________ times. Complete this exercise __________ times a day. Straight leg raises This exercise strengthens the muscles that rotate the leg at the hip and move it away from your body (hip abductors). 1. Lie on your side with your left / right leg in the top position. Lie so your head, shoulder, knee, and hip line up. You may bend your bottom knee to help you keep your balance. 2. Roll your hips slightly forward so your hips are stacked directly over each other and your left / right knee is facing forward. 3. Leading with your heel, lift your top leg 4-6 inches (10-15 cm). You should feel the muscles in your outer hip lifting. ? Do not let your foot drift forward. ? Do not let your knee roll toward the ceiling. 4. Hold this position for __________ seconds. 5. Slowly return your leg to the starting position. 6. Let your muscles relax completely after each repetition. Repeat __________ times. Complete this exercise __________ times a day. Straight leg raises This exercise stretches the muscles that move your hips away from the front of the pelvis (hip extensors). 1. Lie on your abdomen on a firm surface. You can put a pillow under your hips if that is more comfortable. 2. Tense the muscles in your buttocks and lift your left / right leg about 4-6 inches (10-15 cm). Keep your knee straight  as you lift your leg. 3. Hold this position for __________ seconds. 4. Slowly lower your leg to the starting position. 5. Let your leg relax completely after each repetition. Repeat __________ times. Complete this exercise __________ times a day. This information is not intended to replace advice given to you by your health care provider. Make sure you discuss any questions you have with your health  care provider. Document Released: 04/14/2005 Document Revised: 03/21/2018 Document Reviewed: 03/21/2018 [employer] Patient Education  2020 [employer]      EmmettWanda K. Shimon Trowbridge M.D.

## 2019-03-23 ENCOUNTER — Encounter: Payer: Self-pay | Admitting: Internal Medicine

## 2019-03-23 ENCOUNTER — Other Ambulatory Visit: Payer: Self-pay

## 2019-03-23 ENCOUNTER — Ambulatory Visit (INDEPENDENT_AMBULATORY_CARE_PROVIDER_SITE_OTHER): Payer: Medicare Other | Admitting: Internal Medicine

## 2019-03-23 VITALS — BP 128/72 | HR 76 | Temp 97.9°F | Wt 234.2 lb

## 2019-03-23 DIAGNOSIS — E785 Hyperlipidemia, unspecified: Secondary | ICD-10-CM | POA: Diagnosis not present

## 2019-03-23 DIAGNOSIS — M25562 Pain in left knee: Secondary | ICD-10-CM

## 2019-03-23 DIAGNOSIS — R350 Frequency of micturition: Secondary | ICD-10-CM

## 2019-03-23 DIAGNOSIS — R739 Hyperglycemia, unspecified: Secondary | ICD-10-CM | POA: Diagnosis not present

## 2019-03-23 DIAGNOSIS — Z79899 Other long term (current) drug therapy: Secondary | ICD-10-CM

## 2019-03-23 DIAGNOSIS — I10 Essential (primary) hypertension: Secondary | ICD-10-CM | POA: Diagnosis not present

## 2019-03-23 DIAGNOSIS — R8290 Unspecified abnormal findings in urine: Secondary | ICD-10-CM

## 2019-03-23 DIAGNOSIS — Z2821 Immunization not carried out because of patient refusal: Secondary | ICD-10-CM

## 2019-03-23 LAB — CBC WITH DIFFERENTIAL/PLATELET
Basophils Absolute: 0 K/uL (ref 0.0–0.1)
Basophils Relative: 0.6 % (ref 0.0–3.0)
Eosinophils Absolute: 0.2 K/uL (ref 0.0–0.7)
Eosinophils Relative: 2.8 % (ref 0.0–5.0)
HCT: 45.3 % (ref 36.0–46.0)
Hemoglobin: 15.5 g/dL — ABNORMAL HIGH (ref 12.0–15.0)
Lymphocytes Relative: 40.2 % (ref 12.0–46.0)
Lymphs Abs: 3 K/uL (ref 0.7–4.0)
MCHC: 34.3 g/dL (ref 30.0–36.0)
MCV: 89.8 fl (ref 78.0–100.0)
Monocytes Absolute: 0.7 K/uL (ref 0.1–1.0)
Monocytes Relative: 8.8 % (ref 3.0–12.0)
Neutro Abs: 3.6 K/uL (ref 1.4–7.7)
Neutrophils Relative %: 47.6 % (ref 43.0–77.0)
Platelets: 236 K/uL (ref 150.0–400.0)
RBC: 5.05 Mil/uL (ref 3.87–5.11)
RDW: 13.9 % (ref 11.5–15.5)
WBC: 7.6 K/uL (ref 4.0–10.5)

## 2019-03-23 LAB — HEPATIC FUNCTION PANEL
ALT: 21 U/L (ref 0–35)
AST: 15 U/L (ref 0–37)
Albumin: 4.3 g/dL (ref 3.5–5.2)
Alkaline Phosphatase: 57 U/L (ref 39–117)
Bilirubin, Direct: 0.2 mg/dL (ref 0.0–0.3)
Total Bilirubin: 1.2 mg/dL (ref 0.2–1.2)
Total Protein: 6.4 g/dL (ref 6.0–8.3)

## 2019-03-23 LAB — BASIC METABOLIC PANEL WITH GFR
BUN: 13 mg/dL (ref 6–23)
CO2: 30 meq/L (ref 19–32)
Calcium: 9.5 mg/dL (ref 8.4–10.5)
Chloride: 100 meq/L (ref 96–112)
Creatinine, Ser: 0.9 mg/dL (ref 0.40–1.20)
GFR: 62.3 mL/min
Glucose, Bld: 105 mg/dL — ABNORMAL HIGH (ref 70–99)
Potassium: 3.7 meq/L (ref 3.5–5.1)
Sodium: 139 meq/L (ref 135–145)

## 2019-03-23 LAB — POCT URINALYSIS DIPSTICK
Bilirubin, UA: NEGATIVE
Blood, UA: NEGATIVE
Glucose, UA: NEGATIVE
Ketones, UA: NEGATIVE
Leukocytes, UA: NEGATIVE
Nitrite, UA: NEGATIVE
Protein, UA: NEGATIVE
Spec Grav, UA: 1.015 (ref 1.010–1.025)
Urobilinogen, UA: 1 E.U./dL
pH, UA: 7 (ref 5.0–8.0)

## 2019-03-23 LAB — LIPID PANEL
Cholesterol: 196 mg/dL (ref 0–200)
HDL: 48.4 mg/dL (ref >39.00)
LDL Cholesterol: 110 mg/dL — ABNORMAL HIGH (ref 0–99)
NonHDL: 147.4
Total CHOL/HDL Ratio: 4
Triglycerides: 188 mg/dL — ABNORMAL HIGH (ref 0.0–149.0)
VLDL: 37.6 mg/dL (ref 0.0–40.0)

## 2019-03-23 LAB — TSH: TSH: 1.84 u[IU]/mL (ref 0.35–4.50)

## 2019-03-23 LAB — HEMOGLOBIN A1C: Hgb A1c MFr Bld: 6.2 % (ref 4.6–6.5)

## 2019-03-23 NOTE — Patient Instructions (Signed)
Checking  Culture of urine   Screening test is negative  Let us know if fever  More sx in the interim .   Left knee " add otc voltaren gel  4 gram up to 4 x per day to see if helps pain   If  persistent or progressive then would  Get reevaluation sometimes  PT or other is helpful .  For plan.  Healthy  Weight loss will help no matter what the cause .  Will notify you  of labs when available.   Plan  Fu depending on labs and how doing    We can do a physical   When you wish in the next 6  months or so    Journal for Nurse Practitioners, 15(4), [phone]. Retrieved March 20, 2018 from http://clinicalkey.com/nursing">  Knee Exercises Ask your health care provider which exercises are safe for you. Do exercises exactly as told by your health care provider and adjust them as directed. It is normal to feel mild stretching, pulling, tightness, or discomfort as you do these exercises. Stop right away if you feel sudden pain or your pain gets worse. Do not begin these exercises until told by your health care provider. Stretching and range-of-motion exercises These exercises warm up your muscles and joints and improve the movement and flexibility of your knee. These exercises also help to relieve pain and swelling. Knee extension, prone 1. Lie on your abdomen (prone position) on a bed. 2. Place your left / right knee just beyond the edge of the surface so your knee is not on the bed. You can put a towel under your left / right thigh just above your kneecap for comfort. 3. Relax your leg muscles and allow gravity to straighten your knee (extension). You should feel a stretch behind your left / right knee. 4. Hold this position for __________ seconds. 5. Scoot up so your knee is supported between repetitions. Repeat __________ times. Complete this exercise __________ times a day. Knee flexion, active  1. Lie on your back with both legs straight. If this causes back discomfort, bend your left / right  knee so your foot is flat on the floor. 2. Slowly slide your left / right heel back toward your buttocks. Stop when you feel a gentle stretch in the front of your knee or thigh (flexion). 3. Hold this position for __________ seconds. 4. Slowly slide your left / right heel back to the starting position. Repeat __________ times. Complete this exercise __________ times a day. Quadriceps stretch, prone  1. Lie on your abdomen on a firm surface, such as a bed or padded floor. 2. Bend your left / right knee and hold your ankle. If you cannot reach your ankle or pant leg, loop a belt around your foot and grab the belt instead. 3. Gently pull your heel toward your buttocks. Your knee should not slide out to the side. You should feel a stretch in the front of your thigh and knee (quadriceps). 4. Hold this position for __________ seconds. Repeat __________ times. Complete this exercise __________ times a day. Hamstring, supine 1. Lie on your back (supine position). 2. Loop a belt or towel over the ball of your left / right foot. The ball of your foot is on the walking surface, right under your toes. 3. Straighten your left / right knee and slowly pull on the belt to raise your leg until you feel a gentle stretch behind your knee (hamstring). ? Do not  let your knee bend while you do this. ? Keep your other leg flat on the floor. 4. Hold this position for __________ seconds. Repeat __________ times. Complete this exercise __________ times a day. Strengthening exercises These exercises build strength and endurance in your knee. Endurance is the ability to use your muscles for a long time, even after they get tired. Quadriceps, isometric This exercise stretches the muscles in front of your thigh (quadriceps) without moving your knee joint (isometric). 1. Lie on your back with your left / right leg extended and your other knee bent. Put a rolled towel or small pillow under your knee if told by your health  care provider. 2. Slowly tense the muscles in the front of your left / right thigh. You should see your kneecap slide up toward your hip or see increased dimpling just above the knee. This motion will push the back of the knee toward the floor. 3. For __________ seconds, hold the muscle as tight as you can without increasing your pain. 4. Relax the muscles slowly and completely. Repeat __________ times. Complete this exercise __________ times a day. Straight leg raises This exercise stretches the muscles in front of your thigh (quadriceps) and the muscles that move your hips (hip flexors). 1. Lie on your back with your left / right leg extended and your other knee bent. 2. Tense the muscles in the front of your left / right thigh. You should see your kneecap slide up or see increased dimpling just above the knee. Your thigh may even shake a bit. 3. Keep these muscles tight as you raise your leg 4-6 inches (10-15 cm) off the floor. Do not let your knee bend. 4. Hold this position for __________ seconds. 5. Keep these muscles tense as you lower your leg. 6. Relax your muscles slowly and completely after each repetition. Repeat __________ times. Complete this exercise __________ times a day. Hamstring, isometric 1. Lie on your back on a firm surface. 2. Bend your left / right knee about __________ degrees. 3. Dig your left / right heel into the surface as if you are trying to pull it toward your buttocks. Tighten the muscles in the back of your thighs (hamstring) to "dig" as hard as you can without increasing any pain. 4. Hold this position for __________ seconds. 5. Release the tension gradually and allow your muscles to relax completely for __________ seconds after each repetition. Repeat __________ times. Complete this exercise __________ times a day. Hamstring curls If told by your health care provider, do this exercise while wearing ankle weights. Begin with __________ lb weights. Then  increase the weight by 1 lb (0.5 kg) increments. Do not wear ankle weights that are more than __________ lb. 1. Lie on your abdomen with your legs straight. 2. Bend your left / right knee as far as you can without feeling pain. Keep your hips flat against the floor. 3. Hold this position for __________ seconds. 4. Slowly lower your leg to the starting position. Repeat __________ times. Complete this exercise __________ times a day. Squats This exercise strengthens the muscles in front of your thigh and knee (quadriceps). 1. Stand in front of a table, with your feet and knees pointing straight ahead. You may rest your hands on the table for balance but not for support. 2. Slowly bend your knees and lower your hips like you are going to sit in a chair. ? Keep your weight over your heels, not over your toes. ? Keep  your lower legs upright so they are parallel with the table legs. ? Do not let your hips go lower than your knees. ? Do not bend lower than told by your health care provider. ? If your knee pain increases, do not bend as low. 3. Hold the squat position for __________ seconds. 4. Slowly push with your legs to return to standing. Do not use your hands to pull yourself to standing. Repeat __________ times. Complete this exercise __________ times a day. Wall slides This exercise strengthens the muscles in front of your thigh and knee (quadriceps). 1. Lean your back against a smooth wall or door, and walk your feet out 18-24 inches (46-61 cm) from it. 2. Place your feet hip-width apart. 3. Slowly slide down the wall or door until your knees bend __________ degrees. Keep your knees over your heels, not over your toes. Keep your knees in line with your hips. 4. Hold this position for __________ seconds. Repeat __________ times. Complete this exercise __________ times a day. Straight leg raises This exercise strengthens the muscles that rotate the leg at the hip and move it away from your  body (hip abductors). 1. Lie on your side with your left / right leg in the top position. Lie so your head, shoulder, knee, and hip line up. You may bend your bottom knee to help you keep your balance. 2. Roll your hips slightly forward so your hips are stacked directly over each other and your left / right knee is facing forward. 3. Leading with your heel, lift your top leg 4-6 inches (10-15 cm). You should feel the muscles in your outer hip lifting. ? Do not let your foot drift forward. ? Do not let your knee roll toward the ceiling. 4. Hold this position for __________ seconds. 5. Slowly return your leg to the starting position. 6. Let your muscles relax completely after each repetition. Repeat __________ times. Complete this exercise __________ times a day. Straight leg raises This exercise stretches the muscles that move your hips away from the front of the pelvis (hip extensors). 1. Lie on your abdomen on a firm surface. You can put a pillow under your hips if that is more comfortable. 2. Tense the muscles in your buttocks and lift your left / right leg about 4-6 inches (10-15 cm). Keep your knee straight as you lift your leg. 3. Hold this position for __________ seconds. 4. Slowly lower your leg to the starting position. 5. Let your leg relax completely after each repetition. Repeat __________ times. Complete this exercise __________ times a day. This information is not intended to replace advice given to you by your health care provider. Make sure you discuss any questions you have with your health care provider. Document Released: 04/14/2005 Document Revised: 03/21/2018 Document Reviewed: 03/21/2018 Elsevier Patient Education  2020 [employer].

## 2019-03-25 LAB — URINE CULTURE
MICRO NUMBER:: 974077
SPECIMEN QUALITY:: ADEQUATE

## 2019-03-26 ENCOUNTER — Other Ambulatory Visit: Payer: Self-pay

## 2019-03-26 MED ORDER — NITROFURANTOIN MONOHYD MACRO 100 MG PO CAPS: 100.0000 mg | ORAL_CAPSULE | Freq: Two times a day (BID) | ORAL | 0 refills | Status: DC

## 2019-04-04 ENCOUNTER — Other Ambulatory Visit: Payer: Self-pay | Admitting: Internal Medicine

## 2019-04-05 NOTE — Telephone Encounter (Signed)
Last ov:03/23/2019 Last filled:01/01/19

## 2019-05-15 ENCOUNTER — Encounter: Payer: Self-pay | Admitting: Internal Medicine

## 2019-05-15 ENCOUNTER — Ambulatory Visit (HOSPITAL_COMMUNITY)
Admission: RE | Admit: 2019-05-15 | Discharge: 2019-05-15 | Disposition: A | Discharge status: Home / Self Care | Payer: Medicare Other | Source: Ambulatory Visit | Attending: Cardiology | Admitting: Cardiology

## 2019-05-15 ENCOUNTER — Other Ambulatory Visit: Payer: Self-pay

## 2019-05-15 ENCOUNTER — Telehealth (INDEPENDENT_AMBULATORY_CARE_PROVIDER_SITE_OTHER): Payer: Medicare Other | Admitting: Internal Medicine

## 2019-05-15 DIAGNOSIS — M25562 Pain in left knee: Secondary | ICD-10-CM

## 2019-05-15 DIAGNOSIS — M79605 Pain in left leg: Secondary | ICD-10-CM

## 2019-05-15 NOTE — Progress Notes (Signed)
Virtual Visit via Video Note  I connected with@ on 05/15/19 at  2:00 PM EST by a video enabled telemedicine application and verified that I am speaking with the correct person using two identifiers. Location patient: home Location provider:work o office Persons participating in the virtual visit: patient, provider  WIth national recommendations  regarding COVID 19 pandemic   video visit is advised over in office visit for this patient.  Patient aware  of the limitations of evaluation and management by telemedicine and  availability of in person appointments. and agreed to proceed.   HPI: Rachel Blair presents for video visit onset 2 days ago of pressure fullness behind left knee and pain and then numb feeling  On thigh and down leg  Without obvious  swelling or redness  But feels abnormal . Worried about blood clot   No hx of same   Had inj per dr Alfonso Ramus in left knee   In September for  Knee pain  That was different than this. ? If hip pain  ROS: See pertinent positives and negatives per HPI. No cp sob  Falling   Past Medical History:  Diagnosis Date  . Allergy   . Arthritis   . CHEST DISCOMFORT 08/30/2007   Qualifier: Diagnosis of  By: Regis Bill MD, Standley Brooking   . Chest pain    atypical  . History of pneumonia   . Hypertension   . Nasal septal deviation   . OBSTRUCTIVE SLEEP APNEA 08/30/2007   Qualifier: Diagnosis of  By: Regis Bill MD, Standley Brooking   . Odontogenic cyst    of left maxillary sinus  . OSA (obstructive sleep apnea)    moderatley severe, uses CPAP nightly  . Trigger thumb of left hand     Past Surgical History:  Procedure Laterality Date  . biopsy and bone graft of left maxillary sinus odontogenic cyst    . CHOLECYSTECTOMY    . left thumb trigger finger release Right   . TONSILLECTOMY    . TRIGGER FINGER RELEASE Left 05/27/2015   Procedure: RELEASE TRIGGER FINGER/A-1 PULLEY LEFT THUMB;  Surgeon: Daryll Brod, MD;  Location: [facility];  Service: Orthopedics;   Laterality: Left;  . TUBAL LIGATION      Family History  Problem Relation Age of Onset  . Heart disease Mother   . Stroke Father   . Heart failure Father   . Emphysema Father   . Snoring Brother   . Lung cancer Brother   . Rheum arthritis Daughter   . Breast cancer Neg Hx     Social History   Tobacco Use  . Smoking status: Former Smoker    Packs/day: 1.00    Years: 30.00    Pack years: 30.00    Types: Cigarettes    Quit date: 06/14/1998    Years since quitting: 20.9  . Smokeless tobacco: Never Used  Substance Use Topics  . Alcohol use: Yes    Comment: once a month  . Drug use: No      Current Outpatient Medications:  .  hydrochlorothiazide (HYDRODIURIL) 25 MG tablet, TAKE 1 TABLET BY MOUTH DAILY, Disp: 90 tablet, Rfl: 0 .  Lifitegrast (XIIDRA) 5 % SOLN, Place 1 drop into both eyes daily., Disp: , Rfl:  .  LORazepam (ATIVAN) 0.5 MG tablet, TAKE 1 TO 2 TABLETS BY MOUTH BEFORE FLIGHT AS NEEDED AND AS DIRECTED. DO NOT TAKE WITH AMBIEN (Patient not taking: Reported on 03/23/2019), Disp: 10 tablet, Rfl: 0 .  nitrofurantoin, macrocrystal-monohydrate, (MACROBID) 100 MG capsule, Take 1 capsule (100 mg total) by mouth 2 (two) times daily., Disp: 10 capsule, Rfl: 0 .  zolpidem (AMBIEN) 10 MG tablet, TAKE 1 TABLET BY MOUTH EVERY DAY AT BEDTIME AS NEEDED FOR SLEEP, Disp: 90 tablet, Rfl: 0  EXAM: BP Readings from Last 3 Encounters:  03/23/19 128/72  03/06/19 122/71  06/27/18 126/72    VITALS per patient if applicable:  GENERAL: alert, oriented, appears well and in no acute distress  A bit worried   HEENT: atraumatic, conjunttiva clear, no obvious abnormalities on inspection of external nose and ears  NECK: normal movements of the head and neck  LUNGS: on inspection no signs of respiratory distress, breathing rate appears normal, no obvious gross SOB, gasping or wheezing  CV: no obvious cyanosis  PSYCH/NEURO: pleasant and cooperative, no obvious depression or anxiety,  speech and thought processing grossly intact   ASSESSMENT AND PLAN:  Discussed the following assessment and plan:    ICD-10-CM   1. Left leg pain  M79.605 VAS [place] LOWER EXTREMITY VENOUS (DVT)  2. Posterior knee pain, left  M25.562 VAS [place] LOWER EXTREMITY VENOUS (DVT)   Send for [place]  R/o dvt  Bakers etc  If ok then plan ortho eval  Dr Milas Kocher al   She can try her own appt if needed since she was seen there recently   Let us know if need [place] to refer  Counseled.   Expectant management and discussion of plan and treatment with opportunity to ask questions and all were answered. The patient agreed with the plan and demonstrated an understanding of the instructions.   Advised to call back or seek an in-person evaluation if worsening  or having  further concerns . Return for depending on labs and results .   [name], MD

## 2019-05-16 ENCOUNTER — Other Ambulatory Visit: Payer: Self-pay | Admitting: Internal Medicine

## 2019-07-05 ENCOUNTER — Other Ambulatory Visit: Payer: Self-pay | Admitting: Internal Medicine

## 2019-07-06 NOTE — Telephone Encounter (Signed)
Last ov:05/15/2019 Last filled:04/06/2019

## 2019-07-12 ENCOUNTER — Ambulatory Visit: Payer: Medicare Other

## 2019-07-20 ENCOUNTER — Ambulatory Visit: Payer: Medicare Other

## 2019-07-29 ENCOUNTER — Ambulatory Visit: Payer: Medicare Other

## 2019-08-09 ENCOUNTER — Ambulatory Visit: Payer: Medicare Other | Admitting: Adult Health

## 2019-08-09 ENCOUNTER — Encounter: Payer: Self-pay | Admitting: Adult Health

## 2019-08-09 ENCOUNTER — Ambulatory Visit (INDEPENDENT_AMBULATORY_CARE_PROVIDER_SITE_OTHER): Payer: Medicare Other | Admitting: Adult Health

## 2019-08-09 ENCOUNTER — Other Ambulatory Visit: Payer: Self-pay

## 2019-08-09 ENCOUNTER — Telehealth: Payer: Self-pay | Admitting: Adult Health

## 2019-08-09 DIAGNOSIS — G4709 Other insomnia: Secondary | ICD-10-CM | POA: Diagnosis not present

## 2019-08-09 DIAGNOSIS — G4731 Primary central sleep apnea: Secondary | ICD-10-CM | POA: Diagnosis not present

## 2019-08-09 NOTE — Patient Instructions (Addendum)
Order for new ASV device Keep up the good work Continue on your ASV device at nighttime Work on healthy weight Do not drive if sleepy Ambien at bedtime as needed for insomnia (only use if you are wearing your ASV device) Follow-up with Dr. Vassie Loll in 1 year and as needed

## 2019-08-09 NOTE — Addendum Note (Signed)
Addended by: Boone Master E on: 08/09/2019 03:45 PM   Modules accepted: Orders

## 2019-08-09 NOTE — Telephone Encounter (Signed)
LMOM to begin telephone visit Asked patient to call back to reschedule Will sign off

## 2019-08-09 NOTE — Progress Notes (Signed)
Virtual Visit via Telephone Note  I connected with Rachel Blair on 08/09/19 at  2:30 PM EST by telephone and verified that I am speaking with the correct person using two identifiers.  Location: Patient: Home  Provider: Office   I discussed the limitations, risks, security and privacy concerns of performing an evaluation and management service by telephone and the availability of in person appointments. I also discussed with the patient that there may be a patient responsible charge related to this service. The patient expressed understanding and agreed to proceed.   History of Present Illness: 69 year old female followed for severe sleep apnea on ASV sleep machine and insomnia.   Today's televisit is a 102-month follow-up for sleep apnea.  She has very severe sleep apnea is on ASV sleep device.  She says she is doing very well.  She cannot sleep without her machine.  Patient says her machine is getting older and she is worried that it will wear out and she will not have a machine to wear.  She wants an order for new machine.  Patient says she on occasion uses Ambien 10 mg for insomnia.  She is aware that she is never to take this unless she has her machine on.  ASV download shows excellent compliance at 100% usage.  Daily average usage at 9 hours.  Patient is on ASV mode, EPAP 7 cm H2O, minimum pressure support 3 maximum pressure for 15 Patient denies any significant daytime sleepiness.  Feels that she benefits from her ASV device.   Observations/Objective: Speaks in full sentences.  No apparent distress ASV device download with excellent compliance and control  Assessment and Plan: Severe complex sleep apnea- Insomnia-  Excellent control and compliance on current regimen  Plan  Patient Instructions  Order for new ASV device Keep up the good work Continue on your ASV device at nighttime Work on healthy weight Do not drive if sleepy Ambien at bedtime as needed for insomnia (only use  if you are wearing your ASV device) Follow-up with Dr. Vassie Loll in 1 year and as needed       Follow Up Instructions: Follow up in 1 year and As needed     I discussed the assessment and treatment plan with the patient. The patient was provided an opportunity to ask questions and all were answered. The patient agreed with the plan and demonstrated an understanding of the instructions.   The patient was advised to call back or seek an in-person evaluation if the symptoms worsen or if the condition fails to improve as anticipated.  I provided 22 minutes of non-face-to-face time during this encounter.   Rubye Oaks, NP

## 2019-08-27 ENCOUNTER — Telehealth: Payer: Self-pay | Admitting: Adult Health

## 2019-08-27 DIAGNOSIS — G4731 Primary central sleep apnea: Secondary | ICD-10-CM

## 2019-08-27 NOTE — Telephone Encounter (Signed)
New order placed as requested.  

## 2019-08-27 NOTE — Telephone Encounter (Signed)
Please place a new order for this so we can send to a different DME.

## 2019-08-27 NOTE — Telephone Encounter (Signed)
Spoke with pt, she doesn't want to use [facility] because they are closing their [facility] location. She wanted to know if there are other locations that are in [place]. I will forward to [facility] to see if they can help pt choose a DME company. Please advise.

## 2019-09-04 ENCOUNTER — Telehealth: Payer: Self-pay | Admitting: Adult Health

## 2019-09-05 NOTE — Telephone Encounter (Signed)
There is a titration study scanned on 08/05/14.  But [facility] is needing a study showing the patient failed the CPAP & is needing a BiPAP.  I am unable to tell if that is what this study is showing.  Please review & advise if patient requires a new titration study.

## 2019-09-06 NOTE — Telephone Encounter (Signed)
Agree with TP Please provide them with ASV titration study from 2016 & my previous notes from 2019

## 2019-09-06 NOTE — Telephone Encounter (Signed)
No they will need to talk to me she is already on a machine and has been for 5 year and is working for her . She just needs a new machine.  If they are refusing then please let Dr. Vassie Loll  Know for his suggestions.  Also please let patient know this as well .  Please ask is this the insurance or Home Care company  If insurance have them fax over the denial please . I would like to see in writing since it was approved and paid for by insurance 5 years ago.

## 2019-09-06 NOTE — Telephone Encounter (Signed)
I think this is the DME's request since the patient is looking to switch DME's for her bipap from [facility] to [facility].  I will reach out to [facility] to see if we can obtain the documents [facility] is requesting.

## 2019-09-07 NOTE — Telephone Encounter (Signed)
Faxed to [facility] & rec'd fax confirm.  Sent CM to [facility] advising TP & RA's recs as well as notifying them I faxed documents to them & rec'd fax confirm  Nothing further needed at this time.

## 2019-09-07 NOTE — Telephone Encounter (Signed)
Response from [facility]:    Larna Daughters sent to Fredirick Maud[place], [place]  Got it. We will send for authorization from insurance.       Previous Messages   ----- Message -----  From: Lanna Poche D  Sent: 09/07/2019 11:01 AM EDT  To: Larna Daughters  Subject: ASV                        DOB: 03/11/2051   Below are the responses to your telephone message dated 09/04/2019.   Per Rubye Oaks, NP:  No they will need to talk to me she is already on a machine and has been for 5 year and is working for her . She just needs a new machine.  If they are refusing then please let Dr. Vassie Loll Know for his suggestions.  Also please let patient know this as well .  Please ask is this the insurance or Home Care company  If insurance have them fax over the denial please . I would like to see in writing since it was approved and paid for by insurance 5 years ago.   &   Per Dr. Cyril Mourning:  Agree with TP  Please provide them with ASV titration study from 2016 & my previous notes from 2019   As advised, I have faxed the documents listed above to [facility] & received a confirmation that [facility] received this fax.   Thank you,  [facility]

## 2019-09-27 ENCOUNTER — Other Ambulatory Visit: Payer: Self-pay | Admitting: Internal Medicine

## 2019-09-28 ENCOUNTER — Other Ambulatory Visit: Payer: Self-pay | Admitting: Internal Medicine

## 2019-09-28 NOTE — Telephone Encounter (Signed)
Last OV 05/15/2019, Virtual visit  Last filled 07/06/2019, # 90 with 0 refills

## 2019-10-02 ENCOUNTER — Other Ambulatory Visit: Payer: Self-pay | Admitting: Internal Medicine

## 2019-10-02 DIAGNOSIS — Z1231 Encounter for screening mammogram for malignant neoplasm of breast: Secondary | ICD-10-CM

## 2019-10-04 ENCOUNTER — Other Ambulatory Visit: Payer: Self-pay

## 2019-10-04 ENCOUNTER — Ambulatory Visit
Admission: RE | Admit: 2019-10-04 | Discharge: 2019-10-04 | Disposition: A | Discharge status: Home / Self Care | Payer: Medicare Other | Source: Ambulatory Visit | Attending: Internal Medicine | Admitting: Internal Medicine

## 2019-10-04 DIAGNOSIS — Z1231 Encounter for screening mammogram for malignant neoplasm of breast: Secondary | ICD-10-CM

## 2019-10-30 ENCOUNTER — Other Ambulatory Visit: Payer: Self-pay | Admitting: Sports Medicine

## 2019-10-30 DIAGNOSIS — S83249A Other tear of medial meniscus, current injury, unspecified knee, initial encounter: Secondary | ICD-10-CM

## 2019-10-30 DIAGNOSIS — M545 Low back pain, unspecified: Secondary | ICD-10-CM

## 2019-11-18 ENCOUNTER — Ambulatory Visit
Admission: RE | Admit: 2019-11-18 | Discharge: 2019-11-18 | Disposition: A | Discharge status: Home / Self Care | Payer: Medicare Other | Source: Ambulatory Visit | Attending: Sports Medicine | Admitting: Sports Medicine

## 2019-11-18 DIAGNOSIS — M545 Low back pain, unspecified: Secondary | ICD-10-CM

## 2019-11-18 DIAGNOSIS — S83249A Other tear of medial meniscus, current injury, unspecified knee, initial encounter: Secondary | ICD-10-CM

## 2019-11-29 ENCOUNTER — Other Ambulatory Visit: Payer: Medicare Other

## 2019-11-30 ENCOUNTER — Other Ambulatory Visit: Payer: Self-pay

## 2019-11-30 IMAGING — DX DG CHEST 2V
2 series · 2 of 2 positions shown · non-contrast
Comparison: Chest x-ray of 09/01/2013

CLINICAL DATA: Chest pain

EXAM:
CHEST - 2 VIEW

[chest pa]
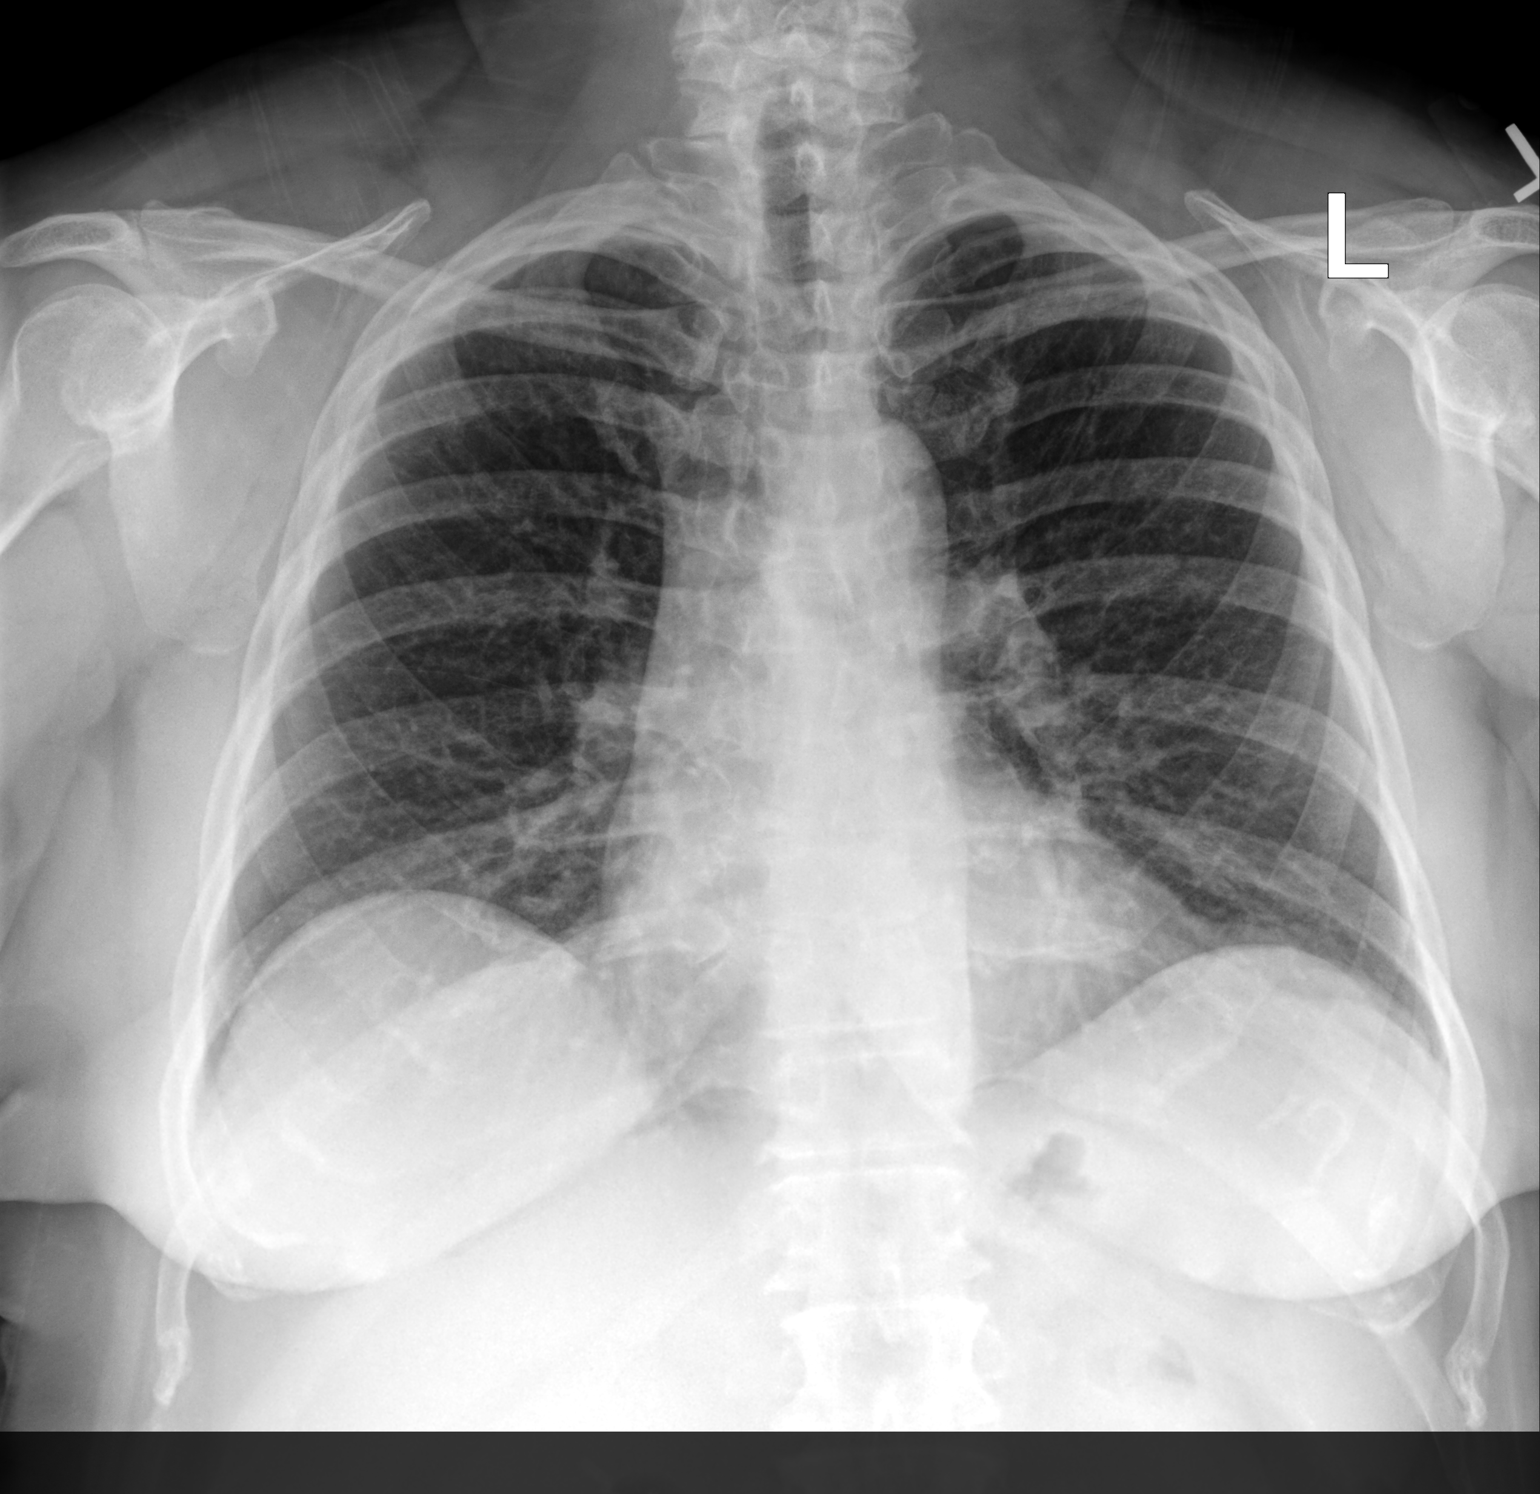

[chest lat]
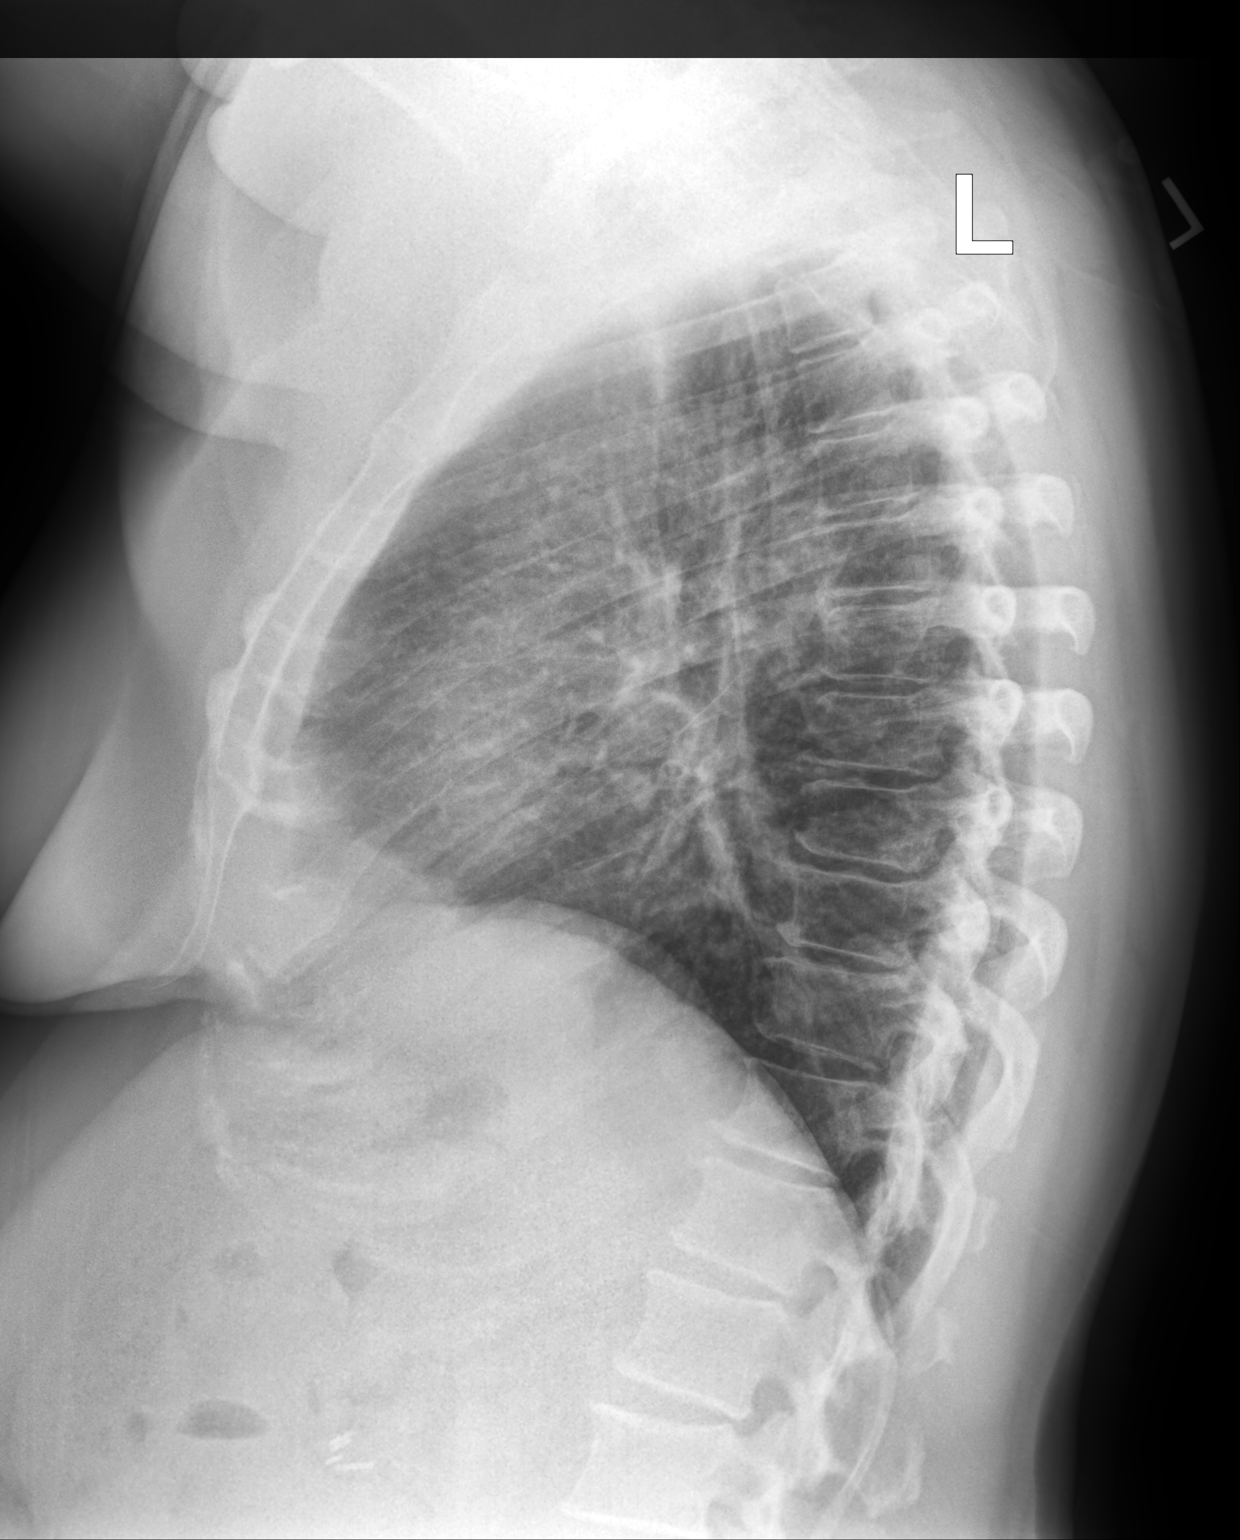

[2 of 2 positions shown; findings below may reference images not displayed]

FINDINGS: No active infiltrate or effusion is seen. There is mild
peribronchial thickening which can be seen with bronchitis.
Mediastinal and hilar contours are unremarkable. The heart is within
normal limits in size. No acute bony abnormality is seen.
IMPRESSION: No active cardiopulmonary disease. Can not exclude mild change of
bronchitis.

## 2019-12-03 ENCOUNTER — Other Ambulatory Visit: Payer: Self-pay

## 2019-12-03 ENCOUNTER — Ambulatory Visit (INDEPENDENT_AMBULATORY_CARE_PROVIDER_SITE_OTHER): Payer: Medicare Other | Admitting: Internal Medicine

## 2019-12-03 ENCOUNTER — Encounter: Payer: Self-pay | Admitting: Internal Medicine

## 2019-12-03 VITALS — BP 122/72 | HR 81 | Temp 98.7°F | Ht 63.5 in | Wt 234.0 lb

## 2019-12-03 DIAGNOSIS — R739 Hyperglycemia, unspecified: Secondary | ICD-10-CM

## 2019-12-03 DIAGNOSIS — Z79899 Other long term (current) drug therapy: Secondary | ICD-10-CM

## 2019-12-03 DIAGNOSIS — I10 Essential (primary) hypertension: Secondary | ICD-10-CM

## 2019-12-03 DIAGNOSIS — G4709 Other insomnia: Secondary | ICD-10-CM

## 2019-12-03 DIAGNOSIS — Z0000 Encounter for general adult medical examination without abnormal findings: Secondary | ICD-10-CM | POA: Diagnosis not present

## 2019-12-03 DIAGNOSIS — R300 Dysuria: Secondary | ICD-10-CM | POA: Diagnosis not present

## 2019-12-03 DIAGNOSIS — E785 Hyperlipidemia, unspecified: Secondary | ICD-10-CM

## 2019-12-03 DIAGNOSIS — R350 Frequency of micturition: Secondary | ICD-10-CM

## 2019-12-03 LAB — POCT URINALYSIS DIPSTICK
Bilirubin, UA: NEGATIVE
Blood, UA: POSITIVE
Glucose, UA: NEGATIVE
Ketones, UA: NEGATIVE
Leukocytes, UA: NEGATIVE
Nitrite, UA: NEGATIVE
Protein, UA: POSITIVE — AB
Spec Grav, UA: 1.015 (ref 1.010–1.025)
Urobilinogen, UA: 0.2 E.U./dL
pH, UA: 7 (ref 5.0–8.0)

## 2019-12-03 NOTE — Progress Notes (Signed)
Chief Complaint  Patient presents with  . Medicare Wellness    Doing well  . Annual Exam    medication    HPI: Rachel Blair 69 y.o. comes in today for Preventive Medicare exam/ wellness visit . Med check  Having some  Mild urinary  sx today burning dysuria .    Today  So checking urine  Last uti was last year  OSA on cpap using ambien  BP controlled  Weight battling     Diet instructions advice welcomed   Health Maintenance  Topic Date Due  . PNA vac Low Risk Adult (2 of 2 - PPSV23) 02/20/2018  . INFLUENZA VACCINE  01/13/2020  . TETANUS/TDAP  06/09/2020  . COLONOSCOPY  02/23/2021  . MAMMOGRAM  10/03/2021  . DEXA SCAN  Completed  . COVID-19 Vaccine  Completed  . Hepatitis C Screening  Completed   Health Maintenance Review LIFESTYLE:  Exercise:    Torn meniscus in left knee   Limiting  Has seen dr Farris Has .  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 Last eye check UTD   Had cataract surgery  Shimmering persists    Hearing Screening   125Hz  250Hz  500Hz  1000Hz  2000Hz  3000Hz  4000Hz  6000Hz  8000Hz   Right ear:   Pass Pass Pass  Pass    Left ear:   Pass Pass Pass  Pass      Visual Acuity Screening   Right eye Left eye Both eyes  Without correction: 20/13 20/20 20/13   With correction:       Safety:  Has smoke detector and wears seat belts.   No excess sun exposure. Sees dentist regularly.  Falls:   No    Advance directive :  Reviewed  Has all the information of such and a plan but not formal at this time   Memory: Felt to be good  , no concern from her or her family.  Depression: No anhedonia unusual crying or depressive symptoms   Nutrition: Eats well balanced diet; adequate calcium and  vitamin D. No swallowing chewing problems.  Injury: no major injuries in the last six months.  Other healthcare providers:  Reviewed today .   Preventive parameters:   Reviewed   ADLS:   There are no problems or need for assistance  driving, feeding, obtaining food, dressing, toileting and bathing, managing money using phone. She is independent.    ROS:  See hpi  GEN/ HEENT: No fever, significant weight changes sweats headaches vision problems hearing changes, CV/ PULM; No chest pain shortness of breath cough, syncope,edema  change in exercise tolerance. GI /GU: No adominal pain, vomiting, change in bowel habits. No blood in the stool. SKIN/HEME: ,no acute skin rashes suspicious lesions or bleeding. No lymphadenopathy, nodules, masses.  NEURO/ PSYCH:  No neurologic signs such as weakness numbness. No depression anxiety. IMM/ Allergy: No unusual infections.  Allergy .   REST of 12 system review negative except as per HPI   Past Medical History:  Diagnosis Date  . Allergy   . Arthritis   . CHEST DISCOMFORT 08/30/2007   Qualifier:  Diagnosis of  By: Regis Bill MD, Standley Brooking   . Chest pain    atypical  . History of pneumonia   . Hypertension   . Nasal septal deviation   . OBSTRUCTIVE SLEEP APNEA 08/30/2007   Qualifier: Diagnosis of  By: Regis Bill MD, Standley Brooking   . Odontogenic cyst    of left maxillary sinus  . OSA (obstructive sleep apnea)    moderatley severe, uses CPAP nightly  . Trigger thumb of left hand     Family History  Problem Relation Age of Onset  . Heart disease Mother   . Stroke Father   . Heart failure Father   . Emphysema Father   . Snoring Brother   . Lung cancer Brother   . Rheum arthritis Daughter   . Breast cancer Neg Hx     Social History   Socioeconomic History  . Marital status: Married    Spouse name: Not on file  . Number of children: 3  . Years of education: Not on file  . Highest education level: Not on file  Occupational History  . Occupation:  retired  Tobacco Use  . Smoking status: Former Smoker    Packs/day: 1.00    Years: 30.00    Pack years: 30.00    Types: Cigarettes    Quit date: 06/14/1998    Years since quitting: 21.4  . Smokeless tobacco: Never Used  Vaping Use  . Vaping Use: Never used  Substance and Sexual Activity  . Alcohol use: Yes    Comment: once a month  . Drug use: No  . Sexual activity: Yes    Birth control/protection: None  Other Topics Concern  . 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 Difficulty of Paying Living Expenses:   Food Insecurity:   . Worried About Charity fundraiser in the Last Year:   . Arboriculturist in the Last Year:   Transportation Needs:   . Film/video editor (Medical):   Marland Kitchen Lack of Transportation (Non-Medical):   Physical Activity:   . Days of Exercise per Week:   . Minutes of Exercise per Session:   Stress:   . Feeling of Stress :   Social Connections:   . Frequency of Communication with Friends and Family:   . Frequency of Social Gatherings with Friends and Family:   . Attends Religious Services:   . Active Member of Clubs or Organizations:   . Attends Archivist Meetings:   Marland Kitchen Marital Status:     Outpatient Encounter Medications as of 12/03/2019  Medication Sig  . cycloSPORINE (RESTASIS) 0.05 % ophthalmic emulsion Place 1 drop into both eyes 2 (two) times daily.  . hydrochlorothiazide (HYDRODIURIL) 25 MG tablet TAKE 1 TABLET EVERY DAY  . LORazepam (ATIVAN) 0.5 MG tablet TAKE 1 TO 2 TABLETS BY MOUTH BEFORE FLIGHT AS NEEDED AND AS DIRECTED. DO NOT TAKE WITH AMBIEN  . zolpidem (AMBIEN) 10 MG tablet TAKE 1 TABLET BY MOUTH EVERY DAY AT BEDTIME AS NEEDED FOR  SLEEP  .  Lifitegrast (XIIDRA) 5 % SOLN Place 1 drop into both eyes daily.  . nitrofurantoin, macrocrystal-monohydrate, (MACROBID) 100 MG capsule Take 1 capsule (100 mg total) by mouth 2 (two) times daily. (Patient not taking: Reported on 12/03/2019)   No facility-administered encounter medications on file as of 12/03/2019.    EXAM:  BP 122/72   Pulse 81   Temp 98.7 F (37.1 C) (Temporal)   Ht 5' 3.5" (1.613 m)   Wt 234 lb (106.1 kg)   SpO2 97%   BMI 40.80 kg/m   Body mass index is 40.8 kg/m.  Physical Exam: Vital signs reviewed JXB:JYNWGEN:This is a well-developed well-nourished alert cooperative   who appears stated age in no acute distress.  HEENT: normocephalic atraumatic , Eyes: PERRL EOM's full, conjunctiva clear, Nares: paten,t no deformity discharge or tenderness., Ears: no deformity EAC's clear TMs with normal landmarks. Mouth:masked  NECK: supple without masses, thyromegaly or bruits. CHEST/PULM:  Clear to auscultation and percussion breath sounds equal no wheeze , rales or rhonchi. No chest wall deformities or tenderness. Abdomen:  Sof,t normal bowel sounds without hepatosplenomegaly, no guarding rebound or masses no CVA tenderness CV: PMI is nondisplaced, S1 S2 no gallops, murmurs, rubs. Peripheral pulses are full without delay.No JVD .  ABDOMEN: Bowel sounds normal nontender  No guard or rebound, no hepato splenomegal no CVA tenderness.   Extremtities:  No clubbing cyanosis or edema, no acute joint swelling or redness no focal atrophy NEURO:  Oriented x3, cranial nerves 3-12 appear to be intact, no obvious focal weakness,gait within normal limits no abnormal reflexes or asymmetrical SKIN: No acute rashes normal turgor, color, no bruising or petechiae. PSYCH: Oriented, good eye contact, no obvious depression anxiety, cognition and judgment appear normal. LN: no cervical axillary inguinal adenopathy No noted deficits in memory, attention, and speech.   Lab Results  Component  Value Date   WBC 7.6 03/23/2019   HGB 15.5 (H) 03/23/2019   HCT 45.3 03/23/2019   PLT 236.0 03/23/2019   GLUCOSE 105 (H) 03/23/2019   CHOL 196 03/23/2019   TRIG 188.0 (H) 03/23/2019   HDL 48.40 03/23/2019   LDLDIRECT 121.8 02/26/2011   LDLCALC 110 (H) 03/23/2019   ALT 21 03/23/2019   AST 15 03/23/2019   NA 139 03/23/2019   K 3.7 03/23/2019   CL 100 03/23/2019   CREATININE 0.90 03/23/2019   BUN 13 03/23/2019   CO2 30 03/23/2019   TSH 1.84 03/23/2019   HGBA1C 6.2 03/23/2019    ASSESSMENT AND PLAN:  Discussed the following assessment and plan:  Visit for preventive health examination  Medicare annual wellness visit, subsequent  Dysuria - Plan: POCT urinalysis dipstick, Culture, Urine, CANCELED: Culture, Urine  Medication management - Plan: Basic metabolic panel, CBC with Differential/Platelet, Hemoglobin A1c, Hepatic function panel, Lipid panel  Other insomnia - Plan: Basic metabolic panel, CBC with Differential/Platelet, Hemoglobin A1c, Hepatic function panel, Lipid panel  Urinary frequency - Plan: Basic metabolic panel, CBC with Differential/Platelet, Hemoglobin A1c, Hepatic function panel, Lipid panel  Hyperlipidemia, unspecified hyperlipidemia type - Plan: Basic metabolic panel, CBC with Differential/Platelet, Hemoglobin A1c, Hepatic function panel, Lipid panel  Hyperglycemia - Plan: Basic metabolic panel, CBC with Differential/Platelet, Hemoglobin A1c, Hepatic function panel, Lipid panel  Essential hypertension - Plan: Basic metabolic panel, CBC with Differential/Platelet, Hemoglobin A1c, Hepatic function panel, Lipid panel  Severe obesity (BMI >= 40) (HCC) Disc   About pneumovax  23  Booster she has had so many vaccines and updated  And covid vaccinated  Not sure necessary at this time  But will readdress as needed  Get fasting lab  Counseled. About  Colon cancer screening due next year a candidate for colo guard if she wishes . Let us know if wants to do nutrition  or weigh management referral  Patient Care Team: Arcangel Minion, Neta Mends, MD as PCP - General Oretha Milch, MD as Consulting Physician (Pulmonary Disease)  Patient Instructions   Track intake    Avoid simple  Or processed carbs  Read labels Consider weight watchers  Let us know if you want [place] to refer to weight management  Or  Nutrition   Get fasting labs  Healthy weight loss will help knee and   Rest of health .   Can get cologuard when due if average risk for colon cancer ( next year)   Health Maintenance, Female Adopting a healthy lifestyle and getting preventive care are important in promoting health and wellness. Ask your health care provider about:  The right schedule for you to have regular tests and exams.  Things you can do on your own to prevent diseases and keep yourself healthy. What should I know about diet, weight, and exercise? Eat a healthy diet   Eat a diet that includes plenty of vegetables, fruits, low-fat dairy products, and lean protein.  Do not eat a lot of foods that are high in solid fats, added sugars, or sodium. Maintain a healthy weight Body mass index (BMI) is used to identify weight problems. It estimates body fat based on height and weight. Your health care provider can help determine your BMI and help you achieve or maintain a healthy weight. Get regular exercise Get regular exercise. This is one of the most important things you can do for your health. Most adults should:  Exercise for at least 150 minutes each week. The exercise should increase your heart rate and make you sweat (moderate-intensity exercise).  Do strengthening exercises at least twice a week. This is in addition to the moderate-intensity exercise.  Spend less time sitting. Even light physical activity can be beneficial. Watch cholesterol and blood lipids Have your blood tested for lipids and cholesterol at 69 years of age, then have this test every 5 years. Have your cholesterol  levels checked more often if:  Your lipid or cholesterol levels are high.  You are older than 69 years of age.  You are at high risk for heart disease. What should I know about cancer screening? Depending on your health history and family history, you may need to have cancer screening at various ages. This may include screening for:  Breast cancer.  Cervical cancer.  Colorectal cancer.  Skin cancer.  Lung cancer. What should I know about heart disease, diabetes, and high blood pressure? Blood pressure and heart disease  High blood pressure causes heart disease and increases the risk of stroke. This is more likely to develop in people who have high blood pressure readings, are of African descent, or are overweight.  Have your blood pressure checked: ? Every 3-5 years if you are 29-54 years of age. ? Every year if you are 74 years old or older. Diabetes Have regular diabetes screenings. This checks your fasting blood sugar level. Have the screening done:  Once every three years after age 78 if you are at a normal weight and have a low risk for diabetes.  More often and at a younger age if you are overweight or have a high risk for diabetes.  What should I know about preventing infection? Hepatitis B If you have a higher risk for hepatitis B, you should be screened for this virus. Talk with your health care provider to find out if you are at risk for hepatitis B infection. Hepatitis C Testing is recommended for:  Everyone born from [birth year] through 1965.  Anyone with known risk factors for hepatitis C. Sexually transmitted infections (STIs)  Get screened for STIs, including gonorrhea and chlamydia, if: ? You are sexually active and are younger than 69 years of age. ? You are older than 69 years of age and your health care provider tells you that you are at risk for this type of infection. ? Your sexual activity has changed since you were last screened, and you are at increased  risk for chlamydia or gonorrhea. Ask your health care provider if you are at risk.  Ask your health care provider about whether you are at high risk for HIV. Your health care provider may recommend a prescription medicine to help prevent HIV infection. If you choose to take medicine to prevent HIV, you should first get tested for HIV. You should then be tested every 3 months for as long as you are taking the medicine. Pregnancy  If you are about to stop having your period (premenopausal) and you may become pregnant, seek counseling before you get pregnant.  Take 400 to 800 micrograms (mcg) of folic acid every day if you become pregnant.  Ask for birth control (contraception) if you want to prevent pregnancy. Osteoporosis and menopause Osteoporosis is a disease in which the bones lose minerals and strength with aging. This can result in bone fractures. If you are 72 years old or older, or if you are at risk for osteoporosis and fractures, ask your health care provider if you should:  Be screened for bone loss.  Take a calcium or vitamin D supplement to lower your risk of fractures.  Be given hormone replacement therapy (HRT) to treat symptoms of menopause. Follow these instructions at home: Lifestyle  Do not use any products that contain nicotine or tobacco, such as cigarettes, e-cigarettes, and chewing tobacco. If you need help quitting, ask your health care provider.  Do not use street drugs.  Do not share needles.  Ask your health care provider for help if you need support or information about quitting drugs. Alcohol use  Do not drink alcohol if: ? Your health care provider tells you not to drink. ? You are pregnant, may be pregnant, or are planning to become pregnant.  If you drink alcohol: ? Limit how much you use to 0-1 drink a day. ? Limit intake if you are breastfeeding.  Be aware of how much alcohol is in your drink. In the [place] one drink equals one 12 oz bottle of beer  (355 mL), one 5 oz glass of wine (148 mL), or one 1 oz glass of hard liquor (44 mL). General instructions  Schedule regular health, dental, and eye exams.  Stay current with your vaccines.  Tell your health care provider if: ? You often feel depressed. ? You have ever been abused or do not feel safe at home. Summary  Adopting a healthy lifestyle and getting preventive care are important in promoting health and wellness.  Follow your health care provider's instructions about healthy diet, exercising, and getting tested or screened for diseases.  Follow your health care provider's instructions on monitoring your cholesterol and blood pressure. This information is not intended to  replace advice given to you by your health care provider. Make sure you discuss any questions you have with your health care provider. Document Revised: 05/24/2018 Document Reviewed: 05/24/2018 Elsevier Patient Education  2020 [employer].       [place] K. Rainier Feuerborn M.D.

## 2019-12-03 NOTE — Patient Instructions (Signed)
Track intake    Avoid simple  Or processed carbs  Read labels Consider weight watchers  Let us know if you want [place] to refer to weight management  Or  Nutrition   Get fasting labs  Healthy weight loss will help knee and   Rest of health .   Can get cologuard when due if average risk for colon cancer ( next year)   Health Maintenance, Female Adopting a healthy lifestyle and getting preventive care are important in promoting health and wellness. Ask your health care provider about:  The right schedule for you to have regular tests and exams.  Things you can do on your own to prevent diseases and keep yourself healthy. What should I know about diet, weight, and exercise? Eat a healthy diet   Eat a diet that includes plenty of vegetables, fruits, low-fat dairy products, and lean protein.  Do not eat a lot of foods that are high in solid fats, added sugars, or sodium. Maintain a healthy weight Body mass index (BMI) is used to identify weight problems. It estimates body fat based on height and weight. Your health care provider can help determine your BMI and help you achieve or maintain a healthy weight. Get regular exercise Get regular exercise. This is one of the most important things you can do for your health. Most adults should:  Exercise for at least 150 minutes each week. The exercise should increase your heart rate and make you sweat (moderate-intensity exercise).  Do strengthening exercises at least twice a week. This is in addition to the moderate-intensity exercise.  Spend less time sitting. Even light physical activity can be beneficial. Watch cholesterol and blood lipids Have your blood tested for lipids and cholesterol at 69 years of age, then have this test every 5 years. Have your cholesterol levels checked more often if:  Your lipid or cholesterol levels are high.  You are older than 69 years of age.  You are at high risk for heart disease. What should I know  about cancer screening? Depending on your health history and family history, you may need to have cancer screening at various ages. This may include screening for:  Breast cancer.  Cervical cancer.  Colorectal cancer.  Skin cancer.  Lung cancer. What should I know about heart disease, diabetes, and high blood pressure? Blood pressure and heart disease  High blood pressure causes heart disease and increases the risk of stroke. This is more likely to develop in people who have high blood pressure readings, are of African descent, or are overweight.  Have your blood pressure checked: ? Every 3-5 years if you are 69-69 years of age. ? Every year if you are 69 years old or older. Diabetes Have regular diabetes screenings. This checks your fasting blood sugar level. Have the screening done:  Once every three years after age 32 if you are at a normal weight and have a low risk for diabetes.  More often and at a younger age if you are overweight or have a high risk for diabetes. What should I know about preventing infection? Hepatitis B If you have a higher risk for hepatitis B, you should be screened for this virus. Talk with your health care provider to find out if you are at risk for hepatitis B infection. Hepatitis C Testing is recommended for:  Everyone born from [birth year] through 1965.  Anyone with known risk factors for hepatitis C. Sexually transmitted infections (STIs)  Get screened for STIs,  including gonorrhea and chlamydia, if: ? You are sexually active and are younger than 69 years of age. ? You are older than 69 years of age and your health care provider tells you that you are at risk for this type of infection. ? Your sexual activity has changed since you were last screened, and you are at increased risk for chlamydia or gonorrhea. Ask your health care provider if you are at risk.  Ask your health care provider about whether you are at high risk for HIV. Your health care  provider may recommend a prescription medicine to help prevent HIV infection. If you choose to take medicine to prevent HIV, you should first get tested for HIV. You should then be tested every 3 months for as long as you are taking the medicine. Pregnancy  If you are about to stop having your period (premenopausal) and you may become pregnant, seek counseling before you get pregnant.  Take 400 to 800 micrograms (mcg) of folic acid every day if you become pregnant.  Ask for birth control (contraception) if you want to prevent pregnancy. Osteoporosis and menopause Osteoporosis is a disease in which the bones lose minerals and strength with aging. This can result in bone fractures. If you are 27 years old or older, or if you are at risk for osteoporosis and fractures, ask your health care provider if you should:  Be screened for bone loss.  Take a calcium or vitamin D supplement to lower your risk of fractures.  Be given hormone replacement therapy (HRT) to treat symptoms of menopause. Follow these instructions at home: Lifestyle  Do not use any products that contain nicotine or tobacco, such as cigarettes, e-cigarettes, and chewing tobacco. If you need help quitting, ask your health care provider.  Do not use street drugs.  Do not share needles.  Ask your health care provider for help if you need support or information about quitting drugs. Alcohol use  Do not drink alcohol if: ? Your health care provider tells you not to drink. ? You are pregnant, may be pregnant, or are planning to become pregnant.  If you drink alcohol: ? Limit how much you use to 0-1 drink a day. ? Limit intake if you are breastfeeding.  Be aware of how much alcohol is in your drink. In the [place] one drink equals one 12 oz bottle of beer (355 mL), one 5 oz glass of wine (148 mL), or one 1 oz glass of hard liquor (44 mL). General instructions  Schedule regular health, dental, and eye exams.  Stay current  with your vaccines.  Tell your health care provider if: ? You often feel depressed. ? You have ever been abused or do not feel safe at home. Summary  Adopting a healthy lifestyle and getting preventive care are important in promoting health and wellness.  Follow your health care provider's instructions about healthy diet, exercising, and getting tested or screened for diseases.  Follow your health care provider's instructions on monitoring your cholesterol and blood pressure. This information is not intended to replace advice given to you by your health care provider. Make sure you discuss any questions you have with your health care provider. Document Revised: 05/24/2018 Document Reviewed: 05/24/2018 Elsevier Patient Education  2020 [employer].

## 2019-12-05 ENCOUNTER — Other Ambulatory Visit: Payer: Self-pay

## 2019-12-05 LAB — URINE CULTURE
MICRO NUMBER:: 10614407
SPECIMEN QUALITY:: ADEQUATE

## 2019-12-05 MED ORDER — SULFAMETHOXAZOLE-TRIMETHOPRIM 800-160 MG PO TABS
1.0000 | ORAL_TABLET | Freq: Two times a day (BID) | ORAL | 0 refills | Status: AC
Start: 2019-12-05 — End: 2019-12-08

## 2019-12-05 NOTE — Progress Notes (Signed)
You do have a uti   Please send in septra ds take 1 po bid  disp 6 ( 3 days  usually is enough to cure this infection) Let us know if no better  after treatment

## 2019-12-20 ENCOUNTER — Other Ambulatory Visit: Payer: Self-pay | Admitting: Internal Medicine

## 2019-12-31 ENCOUNTER — Other Ambulatory Visit: Payer: Self-pay | Admitting: Internal Medicine

## 2020-01-01 NOTE — Telephone Encounter (Signed)
Last OV 12/03/19  Last filled 09/28/2019, # 90 with 0 refills

## 2020-01-03 ENCOUNTER — Other Ambulatory Visit: Payer: Self-pay | Admitting: Internal Medicine

## 2020-01-03 ENCOUNTER — Telehealth: Payer: Self-pay | Admitting: Internal Medicine

## 2020-01-03 NOTE — Telephone Encounter (Signed)
error 

## 2020-01-07 ENCOUNTER — Telehealth: Payer: Self-pay

## 2020-01-07 NOTE — Telephone Encounter (Signed)
Faxed PA to [facility] at [phone] and filled out form for PA. Confirmation that the fax went through. Key # [id number]

## 2020-01-25 ENCOUNTER — Other Ambulatory Visit: Payer: Self-pay | Admitting: Internal Medicine

## 2020-02-06 ENCOUNTER — Other Ambulatory Visit: Payer: Self-pay

## 2020-02-06 ENCOUNTER — Telehealth: Payer: Self-pay | Admitting: Internal Medicine

## 2020-02-06 MED ORDER — HYDROCHLOROTHIAZIDE 25 MG PO TABS: 25.0000 mg | ORAL_TABLET | Freq: Every day | ORAL | 0 refills | Status: DC

## 2020-02-06 NOTE — Telephone Encounter (Signed)
Medication has been sent to the pharmacy requested. Patient will need a lab appointment and a follow up appointment for further refills.

## 2020-02-06 NOTE — Telephone Encounter (Signed)
Pt called to say she wants a 90 day subscription of her   hydrochlorothiazide (HYDRODIURIL) 25 MG tablet   She would like that to be sent to the [facility] on [address]. She has been experiencing issues with [facility] and does not want to use them anymore  Please advise

## 2020-04-11 ENCOUNTER — Other Ambulatory Visit: Payer: Self-pay | Admitting: Internal Medicine

## 2020-04-11 MED ORDER — ZOLPIDEM TARTRATE 10 MG PO TABS: 10.0000 mg | ORAL_TABLET | Freq: Every evening | ORAL | 0 refills | Status: DC | PRN

## 2020-04-11 NOTE — Telephone Encounter (Signed)
Pt call and stated she need a refill on zolpidem (AMBIEN) 10 MG tablet want it sent to  [id number] - , [place] - [address] AT [facility] [address] Phone:  [phone]  Fax:  [phone]

## 2020-04-17 ENCOUNTER — Telehealth: Payer: Self-pay

## 2020-04-25 MED ORDER — FLUCONAZOLE 150 MG PO TABS
150.0000 mg | ORAL_TABLET | Freq: Once | ORAL | 0 refills | Status: AC
Start: 2020-04-25 — End: 2020-04-25

## 2020-04-25 NOTE — Telephone Encounter (Signed)
I have sent in diflucan  . Fu  If  persistent or progressive

## 2020-04-25 NOTE — Telephone Encounter (Signed)
Need more informatio and what has sh tired  Etc   Clinical sx etc .is she asking for  Pill verson or creams

## 2020-04-25 NOTE — Telephone Encounter (Signed)
[id number] - [place], [place] - [address] AT [address] OF [address] Phone:  [phone]  Fax:  [phone]      

## 2020-05-04 ENCOUNTER — Other Ambulatory Visit: Payer: Self-pay | Admitting: Internal Medicine

## 2020-05-08 ENCOUNTER — Other Ambulatory Visit: Payer: Self-pay | Admitting: Internal Medicine

## 2020-07-20 ENCOUNTER — Other Ambulatory Visit: Payer: Self-pay | Admitting: Internal Medicine

## 2020-07-21 ENCOUNTER — Other Ambulatory Visit: Payer: Self-pay

## 2020-07-22 ENCOUNTER — Telehealth (INDEPENDENT_AMBULATORY_CARE_PROVIDER_SITE_OTHER): Payer: Medicare Other | Admitting: Internal Medicine

## 2020-07-22 ENCOUNTER — Encounter: Payer: Self-pay | Admitting: Internal Medicine

## 2020-07-22 DIAGNOSIS — Z79899 Other long term (current) drug therapy: Secondary | ICD-10-CM

## 2020-07-22 DIAGNOSIS — G4731 Primary central sleep apnea: Secondary | ICD-10-CM | POA: Diagnosis not present

## 2020-07-22 DIAGNOSIS — G4709 Other insomnia: Secondary | ICD-10-CM

## 2020-07-22 DIAGNOSIS — I10 Essential (primary) hypertension: Secondary | ICD-10-CM | POA: Diagnosis not present

## 2020-07-22 NOTE — Progress Notes (Signed)
Virtual Visit via Video Note  I connected with@ on 07/22/20 at  1:30 PM EST by a video enabled telemedicine application and verified that I am speaking with the correct person using two identifiers. Location patient: home Location provider:work office Persons participating in the virtual visit: patient, provider  [place] national recommendations  regarding COVID 19 pandemic   video visit is advised over in office visit for this patient.  Patient aware  of the limitations of evaluation and management by telemedicine and  availability of in person appointments. and agreed to proceed.   HPI: Rachel Blair presents for video visit  For med check  Sleep : OSA  CPAP   Doing well Ambien still helpful   And w/o sig se .  BP doing well in 120- 125 range  No major change in health status   No t a d   Sweet tea x 1 per day  utd on covid vaccine has had moderna  Booster  And 1 shingrix  ROS: See pertinent positives and negatives per HPI.  Past Medical History:  Diagnosis Date  . Allergy   . Arthritis   . CHEST DISCOMFORT 08/30/2007   Qualifier: Diagnosis of  By: Fabian Sharp MD, Neta Mends   . Chest pain    atypical  . History of pneumonia   . Hypertension   . Nasal septal deviation   . OBSTRUCTIVE SLEEP APNEA 08/30/2007   Qualifier: Diagnosis of  By: Fabian Sharp MD, Neta Mends   . Odontogenic cyst    of left maxillary sinus  . OSA (obstructive sleep apnea)    moderatley severe, uses CPAP nightly  . Trigger thumb of left hand     Past Surgical History:  Procedure Laterality Date  . biopsy and bone graft of left maxillary sinus odontogenic cyst    . CHOLECYSTECTOMY    . left thumb trigger finger release Right   . TONSILLECTOMY    . TRIGGER FINGER RELEASE Left 05/27/2015   Procedure: RELEASE TRIGGER FINGER/A-1 PULLEY LEFT THUMB;  Surgeon: Cindee Salt, MD;  Location: [facility];  Service: Orthopedics;  Laterality: Left;  . TUBAL LIGATION      Family History  Problem Relation Age of  Onset  . Heart disease Mother   . Stroke Father   . Heart failure Father   . Emphysema Father   . Snoring Brother   . Lung cancer Brother   . Rheum arthritis Daughter   . Breast cancer Neg Hx     Social History   Tobacco Use  . Smoking status: Former Smoker    Packs/day: 1.00    Years: 30.00    Pack years: 30.00    Types: Cigarettes    Quit date: 06/14/1998    Years since quitting: 22.1  . Smokeless tobacco: Never Used  Vaping Use  . Vaping Use: Never used  Substance Use Topics  . Alcohol use: Yes    Comment: once a month  . Drug use: No      Current Outpatient Medications:  .  cycloSPORINE (RESTASIS) 0.05 % ophthalmic emulsion, Place 1 drop into both eyes 2 (two) times daily., Disp: , Rfl:  .  hydrochlorothiazide (HYDRODIURIL) 25 MG tablet, TAKE 1 TABLET BY MOUTH DAILY, Disp: 90 tablet, Rfl: 2 .  LORazepam (ATIVAN) 0.5 MG tablet, TAKE 1 TO 2 TABLETS BY MOUTH BEFORE FLIGHT AS NEEDED AND AS DIRECTED. DO NOT TAKE WITH AMBIEN, Disp: 10 tablet, Rfl: 0 .  zolpidem (AMBIEN) 10 MG tablet,  TAKE 1 TABLET(10 MG) BY MOUTH AT BEDTIME AS NEEDED FOR SLEEP, Disp: 90 tablet, Rfl: 0  EXAM: BP Readings from Last 3 Encounters:  12/03/19 122/72  03/23/19 128/72  03/06/19 122/71    VITALS per patient if applicable:  GENERAL: alert, oriented, appears well and in no acute distress  HEENT: atraumatic, conjunttiva clear, no obvious abnormalities on inspection of external nose and ears  NECK: normal movements of the head and neck  LUNGS: on inspection no signs of respiratory distress, breathing rate appears normal, no obvious gross SOB, gasping or wheezing  CV: no obvious cyanosis  PSYCH/NEURO: pleasant and cooperative, no obvious depression or anxiety, speech and thought processing grossly intact Lab Results  Component Value Date   WBC 7.6 03/23/2019   HGB 15.5 (H) 03/23/2019   HCT 45.3 03/23/2019   PLT 236.0 03/23/2019   GLUCOSE 105 (H) 03/23/2019   CHOL 196 03/23/2019   TRIG  188.0 (H) 03/23/2019   HDL 48.40 03/23/2019   LDLDIRECT 121.8 02/26/2011   LDLCALC 110 (H) 03/23/2019   ALT 21 03/23/2019   AST 15 03/23/2019   NA 139 03/23/2019   K 3.7 03/23/2019   CL 100 03/23/2019   CREATININE 0.90 03/23/2019   BUN 13 03/23/2019   CO2 30 03/23/2019   TSH 1.84 03/23/2019   HGBA1C 6.2 03/23/2019    ASSESSMENT AND PLAN:  Discussed the following assessment and plan:    ICD-10-CM   1. Other insomnia  G47.09   2. Medication management  Z79.899   3. Essential hypertension  I10   4. 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 [employer].   Expectant management and discussion of plan and treatment with opportunity to ask questions and all were answered. The patient agreed with the plan and demonstrated an understanding of the instructions.   Advised to call back or seek an in-person evaluation if worsening  or having  further concerns . Return for lab appt soon  in pserson visit  pv or other in 4-6 months .   [name], MD

## 2020-07-23 ENCOUNTER — Telehealth: Payer: Self-pay | Admitting: Internal Medicine

## 2020-07-23 ENCOUNTER — Other Ambulatory Visit (INDEPENDENT_AMBULATORY_CARE_PROVIDER_SITE_OTHER): Payer: Medicare Other

## 2020-07-23 ENCOUNTER — Other Ambulatory Visit: Payer: Self-pay

## 2020-07-23 DIAGNOSIS — E785 Hyperlipidemia, unspecified: Secondary | ICD-10-CM

## 2020-07-23 DIAGNOSIS — G4709 Other insomnia: Secondary | ICD-10-CM

## 2020-07-23 DIAGNOSIS — R739 Hyperglycemia, unspecified: Secondary | ICD-10-CM

## 2020-07-23 DIAGNOSIS — R350 Frequency of micturition: Secondary | ICD-10-CM

## 2020-07-23 DIAGNOSIS — I10 Essential (primary) hypertension: Secondary | ICD-10-CM

## 2020-07-23 DIAGNOSIS — Z79899 Other long term (current) drug therapy: Secondary | ICD-10-CM | POA: Diagnosis not present

## 2020-07-23 LAB — BASIC METABOLIC PANEL
BUN: 12 mg/dL (ref 6–23)
CO2: 32 mEq/L (ref 19–32)
Calcium: 9.1 mg/dL (ref 8.4–10.5)
Chloride: 99 mEq/L (ref 96–112)
Creatinine, Ser: 0.85 mg/dL (ref 0.40–1.20)
GFR: 70.03 mL/min (ref >60.00)
Glucose, Bld: 132 mg/dL — ABNORMAL HIGH (ref 70–99)
Potassium: 3.8 mEq/L (ref 3.5–5.1)
Sodium: 137 mEq/L (ref 135–145)

## 2020-07-23 LAB — CBC WITH DIFFERENTIAL/PLATELET
Basophils Absolute: 0 10*3/uL (ref 0.0–0.1)
Basophils Relative: 0.5 % (ref 0.0–3.0)
Eosinophils Absolute: 0.3 10*3/uL (ref 0.0–0.7)
Eosinophils Relative: 4.3 % (ref 0.0–5.0)
HCT: 45.5 % (ref 36.0–46.0)
Hemoglobin: 15.9 g/dL — ABNORMAL HIGH (ref 12.0–15.0)
Lymphocytes Relative: 49.3 % — ABNORMAL HIGH (ref 12.0–46.0)
Lymphs Abs: 3.1 10*3/uL (ref 0.7–4.0)
MCHC: 34.8 g/dL (ref 30.0–36.0)
MCV: 88.7 fl (ref 78.0–100.0)
Monocytes Absolute: 0.5 10*3/uL (ref 0.1–1.0)
Monocytes Relative: 8.3 % (ref 3.0–12.0)
Neutro Abs: 2.4 10*3/uL (ref 1.4–7.7)
Neutrophils Relative %: 37.6 % — ABNORMAL LOW (ref 43.0–77.0)
Platelets: 230 10*3/uL (ref 150.0–400.0)
RBC: 5.13 Mil/uL — ABNORMAL HIGH (ref 3.87–5.11)
RDW: 13.1 % (ref 11.5–15.5)
WBC: 6.3 10*3/uL (ref 4.0–10.5)

## 2020-07-23 LAB — HEPATIC FUNCTION PANEL
ALT: 47 U/L — ABNORMAL HIGH (ref 0–35)
AST: 33 U/L (ref 0–37)
Albumin: 4.1 g/dL (ref 3.5–5.2)
Alkaline Phosphatase: 59 U/L (ref 39–117)
Bilirubin, Direct: 0.2 mg/dL (ref 0.0–0.3)
Total Bilirubin: 1.3 mg/dL — ABNORMAL HIGH (ref 0.2–1.2)
Total Protein: 6.6 g/dL (ref 6.0–8.3)

## 2020-07-23 LAB — LIPID PANEL
Cholesterol: 176 mg/dL (ref 0–200)
HDL: 43.6 mg/dL (ref >39.00)
LDL Cholesterol: 95 mg/dL (ref 0–99)
NonHDL: 131.95
Total CHOL/HDL Ratio: 4
Triglycerides: 185 mg/dL — ABNORMAL HIGH (ref 0.0–149.0)
VLDL: 37 mg/dL (ref 0.0–40.0)

## 2020-07-23 LAB — HEMOGLOBIN A1C: Hgb A1c MFr Bld: 7.1 % — ABNORMAL HIGH (ref 4.6–6.5)

## 2020-07-23 NOTE — Telephone Encounter (Signed)
Pt is calling in stating that the pharmacy is telling her that she needs a handwritten prescription for Rx zolpidem (AMBIEN) 10 MG b/c of it being a controlled substance medication.  Pharm:  [facility] on [address]

## 2020-07-24 MED ORDER — ZOLPIDEM TARTRATE 10 MG PO TABS: 10.0000 mg | ORAL_TABLET | Freq: Every evening | ORAL | 1 refills | Status: DC | PRN

## 2020-07-24 NOTE — Telephone Encounter (Signed)
Patient informed of the message

## 2020-07-24 NOTE — Progress Notes (Signed)
So lab work shows increased blood sugar into the early diabetic range.  And a minor liver abnormality which may be related.  We could consider medication such as Metformin extended release 500 mg once a day and have you intensify lifestyle cutting out sugars extra carbohydrates.  Option to intensify the lifestyle without the medication.  Either way advise follow-up visit in person so we can recheck your blood sugar in 3 to 4 months. Make fu appt  and then let  us know your preference for beginning metformin  before next visit

## 2020-07-24 NOTE — Telephone Encounter (Signed)
Spoke with Nochia at [facility] and informed her of the message below.  She stated a note was received stating the Rx was denied, responded to by other means and to expect a written Rx.  Nochia stated the Rx can be sent electronically and message sent to PCP.

## 2020-07-24 NOTE — Telephone Encounter (Signed)
Sent in electronically . Again   Not sure what happened

## 2020-07-24 NOTE — Telephone Encounter (Signed)
This doesn't make sense.   Controlled  Should be  Sent electronically as much possible.     Please call pharmacy and find out what  Is the problem  Maybe  Their   E prescribe isnt working?

## 2020-07-29 ENCOUNTER — Telehealth: Payer: Self-pay

## 2020-07-29 NOTE — Telephone Encounter (Signed)
Prior authorization has been submitted to [facility]  for Zolpidem.

## 2020-08-12 NOTE — Telephone Encounter (Signed)
Prior authorization was denied, but called [facility] and patient picked up prescription on 07/24/2020

## 2020-08-13 ENCOUNTER — Encounter: Payer: Self-pay | Admitting: Pulmonary Disease

## 2020-08-13 ENCOUNTER — Ambulatory Visit (INDEPENDENT_AMBULATORY_CARE_PROVIDER_SITE_OTHER): Payer: Medicare Other | Admitting: Pulmonary Disease

## 2020-08-13 ENCOUNTER — Other Ambulatory Visit: Payer: Self-pay

## 2020-08-13 DIAGNOSIS — G4731 Primary central sleep apnea: Secondary | ICD-10-CM | POA: Diagnosis not present

## 2020-08-13 DIAGNOSIS — G4709 Other insomnia: Secondary | ICD-10-CM | POA: Diagnosis not present

## 2020-08-13 NOTE — Assessment & Plan Note (Signed)
Download was reviewed on ASV machine, EPAP 7 cm pressure support from 3 to 10 cm, shows good control of events with average pressure 15 cm with maximum up to 17 cm, minimal leak, excellent compliance  ASV is only helped improve her daytime somnolence and fatigue and this is worked well for her PAP supplies will be renewed for a year

## 2020-08-13 NOTE — Progress Notes (Signed)
Subjective:    Patient ID: Rachel Blair, female    DOB: 1950/09/14, 70 y.o.   MRN: [id number]  HPI  70 yo woman followed for severe complex sleep apnea on ASV and insomnia.  Chief Complaint  Patient presents with  . Follow-up    Patient feels good overall, machine is working good and she is sleeping good.    She obtained a new machine and 2021.  She has done well on ASV, very compliant, persistently helped her daytime somnolence and fatigue. Denies any problems with mask or pressure, she uses nasal interface. No dryness. She is vaccinated for COVID had a mild illness in December but never got tested. She continues to take Ambien every night for insomnia takes lorazepam only if she has to fly    Significant tests/ events reviewed  HST 05/2014: AHI 68/hr ASV titration study 07/2014: Optimal pressure >>PS 3-15, EPAP 7  Review of Systems Patient denies significant dyspnea,cough, hemoptysis,  chest pain, palpitations, pedal edema, orthopnea, paroxysmal nocturnal dyspnea, lightheadedness, nausea, vomiting, abdominal or  leg pains      Objective:   Physical Exam   Gen. Pleasant, obese, in no distress ENT - no lesions, no post nasal drip Neck: No JVD, no thyromegaly, no carotid bruits Lungs: no use of accessory muscles, no dullness to percussion, decreased without rales or rhonchi  Cardiovascular: Rhythm regular, heart sounds  normal, no murmurs or gallops, no peripheral edema Musculoskeletal: No deformities, no cyanosis or clubbing , no tremors        Assessment & Plan:

## 2020-08-13 NOTE — Patient Instructions (Signed)
ASV machine is working well  

## 2020-08-13 NOTE — Assessment & Plan Note (Signed)
Takes Ambien every night.

## 2020-08-18 ENCOUNTER — Encounter: Payer: Self-pay | Admitting: Family Medicine

## 2020-08-18 ENCOUNTER — Telehealth (INDEPENDENT_AMBULATORY_CARE_PROVIDER_SITE_OTHER): Payer: Medicare Other | Admitting: Family Medicine

## 2020-08-18 ENCOUNTER — Other Ambulatory Visit: Payer: Self-pay

## 2020-08-18 DIAGNOSIS — J028 Acute pharyngitis due to other specified organisms: Secondary | ICD-10-CM | POA: Diagnosis not present

## 2020-08-18 MED ORDER — AMOXICILLIN 500 MG PO TABS: 500.0000 mg | ORAL_TABLET | Freq: Two times a day (BID) | ORAL | 0 refills | Status: AC

## 2020-08-18 NOTE — Progress Notes (Signed)
Virtual Visit via Video Note  I connected with Rachel Blair on 08/18/20 at  2:00 PM EST by a video enabled telemedicine application 2/2 COVID-19 pandemic and verified that I am speaking with the correct person using two identifiers.  Location patient: home Location provider:work or home office Persons participating in the virtual visit: patient, provider  I discussed the limitations of evaluation and management by telemedicine and the availability of in person appointments. The patient expressed understanding and agreed to proceed.   HPI: 70 year old female with past medical history significant for seasonal allergies, arthritis, HTN, OSA, former smoker, GERD who is followed by Dr. Fabian Sharp and seen today for acute concern.  Sore throat since Friday.  States mouth feels raw.  Sees white patches in back of throat and feels dry.  Took tylenol and benadryl.  Also tried gargling with warm salt water.  Denies sick contacts, fever, facial pain/pressure, ear pain/pressure, nausea, vomiting, cough.  ROS: See pertinent positives and negatives per HPI.  Past Medical History:  Diagnosis Date  . Allergy   . Arthritis   . CHEST DISCOMFORT 08/30/2007   Qualifier: Diagnosis of  By: Fabian Sharp MD, Neta Mends   . Chest pain    atypical  . History of pneumonia   . Hypertension   . Nasal septal deviation   . OBSTRUCTIVE SLEEP APNEA 08/30/2007   Qualifier: Diagnosis of  By: Fabian Sharp MD, Neta Mends   . Odontogenic cyst    of left maxillary sinus  . OSA (obstructive sleep apnea)    moderatley severe, uses CPAP nightly  . Trigger thumb of left hand     Past Surgical History:  Procedure Laterality Date  . biopsy and bone graft of left maxillary sinus odontogenic cyst    . CHOLECYSTECTOMY    . left thumb trigger finger release Right   . TONSILLECTOMY    . TRIGGER FINGER RELEASE Left 05/27/2015   Procedure: RELEASE TRIGGER FINGER/A-1 PULLEY LEFT THUMB;  Surgeon: Cindee Salt, MD;  Location: [facility];   Service: Orthopedics;  Laterality: Left;  . TUBAL LIGATION      Family History  Problem Relation Age of Onset  . Heart disease Mother   . Stroke Father   . Heart failure Father   . Emphysema Father   . Snoring Brother   . Lung cancer Brother   . Rheum arthritis Daughter   . Breast cancer Neg Hx       Current Outpatient Medications:  .  cycloSPORINE (RESTASIS) 0.05 % ophthalmic emulsion, Place 1 drop into both eyes 2 (two) times daily., Disp: , Rfl:  .  hydrochlorothiazide (HYDRODIURIL) 25 MG tablet, TAKE 1 TABLET BY MOUTH DAILY, Disp: 90 tablet, Rfl: 2 .  LORazepam (ATIVAN) 0.5 MG tablet, TAKE 1 TO 2 TABLETS BY MOUTH BEFORE FLIGHT AS NEEDED AND AS DIRECTED. DO NOT TAKE WITH AMBIEN, Disp: 10 tablet, Rfl: 0 .  zolpidem (AMBIEN) 10 MG tablet, TAKE 1 TABLET(10 MG) BY MOUTH AT BEDTIME AS NEEDED FOR SLEEP, Disp: 90 tablet, Rfl: 0 .  zolpidem (AMBIEN) 10 MG tablet, Take 1 tablet (10 mg total) by mouth at bedtime as needed. for sleep, Disp: 90 tablet, Rfl: 1  EXAM:  VITALS per patient if applicable: RR between 12-20 bpm  GENERAL: alert, oriented, appears well and in no acute distress  HEENT: atraumatic, conjunctiva clear, no obvious abnormalities on inspection of external nose and ears  NECK: normal movements of the head and neck  LUNGS: on inspection no signs of  respiratory distress, breathing rate appears normal, no obvious gross SOB, gasping or wheezing  CV: no obvious cyanosis  MS: moves all visible extremities without noticeable abnormality  PSYCH/NEURO: pleasant and cooperative, no obvious depression or anxiety, speech and thought processing grossly intact  ASSESSMENT AND PLAN:  Discussed the following assessment and plan:  Pharyngitis due to other organism  -concern for strep pharyngitis. -will start Amoxicillin 500 mg BID  -Continue supportive care and Tylenol, gargling with Chloraseptic spray or warm salt water -Given precautions - Plan: amoxicillin (AMOXIL) 500  MG tablet  Follow-up as needed for continued or worsening symptoms   I discussed the assessment and treatment plan with the patient. The patient was provided an opportunity to ask questions and all were answered. The patient agreed with the plan and demonstrated an understanding of the instructions.   The patient was advised to call back or seek an in-person evaluation if the symptoms worsen or if the condition fails to improve as anticipated.   Deeann Saint, MD

## 2020-09-10 ENCOUNTER — Telehealth: Payer: Self-pay | Admitting: Pulmonary Disease

## 2020-09-10 DIAGNOSIS — G4731 Primary central sleep apnea: Secondary | ICD-10-CM

## 2020-09-10 NOTE — Telephone Encounter (Signed)
Pt was recently seen 08/13/20. No order was put in to have pt's supplies renewed. Called and spoke with pt letting her know that we would send order to [facility] for her to receive climate tubing for her BIPAP. Pt verbalized understanding. Nothing further needed.

## 2020-10-06 ENCOUNTER — Encounter: Payer: Self-pay | Admitting: Family Medicine

## 2020-10-06 MED ORDER — FLUCONAZOLE 150 MG PO TABS
150.0000 mg | ORAL_TABLET | Freq: Once | ORAL | 0 refills | Status: AC
Start: 2020-10-06 — End: 2020-10-06

## 2020-10-06 NOTE — Telephone Encounter (Signed)
So Dr. Salomon Fick is not available today A glad you are better Please send in Diflucan 150 mg dispense 1 take 1 p.o. no refills. If having recurrent problem let us know.

## 2020-10-06 NOTE — Telephone Encounter (Signed)
Diflucan sent to patient's pharmacy

## 2020-10-09 ENCOUNTER — Telehealth: Payer: Medicare Other | Admitting: Internal Medicine

## 2020-10-09 ENCOUNTER — Encounter: Payer: Self-pay | Admitting: Internal Medicine

## 2020-10-09 ENCOUNTER — Telehealth (INDEPENDENT_AMBULATORY_CARE_PROVIDER_SITE_OTHER): Payer: Medicare Other | Admitting: Internal Medicine

## 2020-10-09 ENCOUNTER — Other Ambulatory Visit (HOSPITAL_COMMUNITY): Payer: Self-pay

## 2020-10-09 DIAGNOSIS — U071 COVID-19: Secondary | ICD-10-CM | POA: Diagnosis not present

## 2020-10-09 MED ORDER — NIRMATRELVIR/RITONAVIR (PAXLOVID)TABLET
ORAL_TABLET | ORAL | 0 refills | Status: DC
  Filled 2020-10-09: qty 30, 5d supply, fill #0

## 2020-10-09 NOTE — Telephone Encounter (Signed)
My Chart appointment scheduled for 10/09/20 at 11am.  Patient aware

## 2020-10-09 NOTE — Telephone Encounter (Signed)
Virtual visit scheduled for 04/28 at 10:30.

## 2020-10-09 NOTE — Progress Notes (Signed)
Virtual Visit via Video Note  I connected with@ on 10/09/20 at 10:30 AM EDT by a video enabled telemedicine application and verified that I am speaking with the correct person using two identifiers. Location patient: home Location provider:work or home office Persons participating in the virtual visit: patient, provider  [place] national recommendations  regarding COVID 19 pandemic   video visit is advised over in office visit for this patient.  Patient aware  of the limitations of evaluation and management by telemedicine and  availability of in person appointments. and agreed to proceed.   HPI: Rachel Blair presents for video visit   SDA because of respiratory symptoms and a positive COVID test at home. Ms. Rachel Blair began yesterday not feeling well with upper respiratory congestion felt to be from allergy.  She did have a home test which was positive for COVID repeated it later to be sure they were both positive.  Last night she had fever chills 102 but better this morning.  Chest feels somewhat tight but no shortness of breath some headache. Only exposures in groups would be church. She thinks she was exposed by taking care of her granddaughter a 70-year-old who had had a respiratory infection is now better.  Her son who has joint custody has tested positive recently for COVID. Her last immunization booster was about September.  Husband has tested negative is under initial evaluation for new diagnosis of prostate cancer..  She has CPAP machine.  ROS: See pertinent positives and negatives per HPI.  Past Medical History:  Diagnosis Date  . Allergy   . Arthritis   . CHEST DISCOMFORT 08/30/2007   Qualifier: Diagnosis of  By: Rachel Sharp MD, Rachel Blair   . Chest pain    atypical  . History of pneumonia   . Hypertension   . Nasal septal deviation   . OBSTRUCTIVE SLEEP APNEA 08/30/2007   Qualifier: Diagnosis of  By: Rachel Sharp MD, Rachel Blair   . Odontogenic cyst    of left maxillary sinus  . OSA  (obstructive sleep apnea)    moderatley severe, uses CPAP nightly  . Trigger thumb of left hand     Past Surgical History:  Procedure Laterality Date  . biopsy and bone graft of left maxillary sinus odontogenic cyst    . CHOLECYSTECTOMY    . left thumb trigger finger release Right   . TONSILLECTOMY    . TRIGGER FINGER RELEASE Left 05/27/2015   Procedure: RELEASE TRIGGER FINGER/A-1 PULLEY LEFT THUMB;  Surgeon: Rachel Salt, MD;  [facility];  Service: Orthopedics;  Laterality: Left;  . TUBAL LIGATION      Family History  Problem Relation Age of Onset  . Heart disease Mother   . Stroke Father   . Heart failure Father   . Emphysema Father   . Snoring Brother   . Lung cancer Brother   . Rheum arthritis Daughter   . Breast cancer Neg Hx     Social History   Tobacco Use  . Smoking status: Former Smoker    Packs/day: 1.00    Years: 30.00    Pack years: 30.00    Types: Cigarettes    Quit date: 06/14/1998    Years since quitting: 22.3  . Smokeless tobacco: Never Used  Vaping Use  . Vaping Use: Never used  Substance Use Topics  . Alcohol use: Yes    Comment: once a month  . Drug use: No      Current Outpatient Medications:  .  cycloSPORINE (RESTASIS) 0.05 % ophthalmic emulsion, Place 1 drop into both eyes 2 (two) times daily., Disp: , Rfl:  .  hydrochlorothiazide (HYDRODIURIL) 25 MG tablet, TAKE 1 TABLET BY MOUTH DAILY, Disp: 90 tablet, Rfl: 2 .  LORazepam (ATIVAN) 0.5 MG tablet, TAKE 1 TO 2 TABLETS BY MOUTH BEFORE FLIGHT AS NEEDED AND AS DIRECTED. DO NOT TAKE WITH AMBIEN, Disp: 10 tablet, Rfl: 0 .  nirmatrelvir/ritonavir EUA (PAXLOVID) TABS, Patient GFR is 70  Take nirmatrelvir (150 mg) 2 tablet(s) twice daily for 5 days and ritonavir (100 mg) one tablet twice daily for 5 days.take  Medication together., Disp: 30 tablet, Rfl: 0 .  zolpidem (AMBIEN) 10 MG tablet, TAKE 1 TABLET(10 MG) BY MOUTH AT BEDTIME AS NEEDED FOR SLEEP, Disp: 90 tablet, Rfl: 0 .   zolpidem (AMBIEN) 10 MG tablet, Take 1 tablet (10 mg total) by mouth at bedtime as needed. for sleep, Disp: 90 tablet, Rfl: 1  EXAM: BP Readings from Last 3 Encounters:  08/13/20 118/78  12/03/19 122/72  03/23/19 128/72    VITALS per patient if applicable:  GENERAL: alert, oriented, appears well and in no acute distress mild congestion nontoxic no respiratory distress and normal speech.  HEENT: atraumatic, conjunttiva clear, no obvious abnormalities on inspection of external nose and ears  NECK: normal movements of the head and neck  LUNGS: on inspection no signs of respiratory distress, breathing rate appears normal, no obvious gross SOB, gasping or wheezing  CV: no obvious cyanosis  PSYCH/NEURO: pleasant and cooperative, no obvious depression or anxiety, speech and thought processing grossly intact Lab Results  Component Value Date   WBC 6.3 07/23/2020   HGB 15.9 (H) 07/23/2020   HCT 45.5 07/23/2020   PLT 230.0 07/23/2020   GLUCOSE 132 (H) 07/23/2020   CHOL 176 07/23/2020   TRIG 185.0 (H) 07/23/2020   HDL 43.60 07/23/2020   LDLDIRECT 121.8 02/26/2011   LDLCALC 95 07/23/2020   ALT 47 (H) 07/23/2020   AST 33 07/23/2020   NA 137 07/23/2020   K 3.8 07/23/2020   CL 99 07/23/2020   CREATININE 0.85 07/23/2020   BUN 12 07/23/2020   CO2 32 07/23/2020   TSH 1.84 03/23/2019   HGBA1C 7.1 (H) 07/23/2020    ASSESSMENT AND PLAN:  Discussed the following assessment and plan:    ICD-10-CM   1. COVID-19 virus infection  U07.1    Acute respiratory illness with positive home antigen COVID test and probable exposure previous by family member.  Has risk factors include age weight metabolic hypertension. She is on day 2 of symptoms.  And at this time no obvious complications. Candidate for antivirals discussed risk benefit tendon pack Rachel Blair Rachel Blair [facility] pharmacy to begin supportive symptoms isolation for 10 days from the onset of symptoms contact us if any deterioration or  persistent concerns. Counseled.  Should be able to   br out of isolation  By May 9th appt for her husband.   Expectant management and discussion of plan and treatment with opportunity to ask questions and all were answered. The patient agreed with the plan and demonstrated an understanding of the instructions.   Advised to call back or seek an in-person evaluation if worsening  or having  further concerns . No follow-ups on file. 32 minutes to include record review evaluation counseling about infection complications  medication.   [name], MD

## 2020-10-09 NOTE — Telephone Encounter (Signed)
Make her vidoe visit today asap

## 2020-11-07 ENCOUNTER — Other Ambulatory Visit: Payer: Self-pay | Admitting: Internal Medicine

## 2020-11-07 DIAGNOSIS — Z0000 Encounter for general adult medical examination without abnormal findings: Secondary | ICD-10-CM

## 2020-11-24 ENCOUNTER — Ambulatory Visit
Admission: RE | Admit: 2020-11-24 | Discharge: 2020-11-24 | Disposition: A | Discharge status: Home / Self Care | Payer: Medicare Other | Source: Ambulatory Visit | Attending: Internal Medicine | Admitting: Internal Medicine

## 2020-11-24 ENCOUNTER — Other Ambulatory Visit: Payer: Self-pay

## 2020-11-24 DIAGNOSIS — Z0000 Encounter for general adult medical examination without abnormal findings: Secondary | ICD-10-CM

## 2020-11-25 ENCOUNTER — Ambulatory Visit (INDEPENDENT_AMBULATORY_CARE_PROVIDER_SITE_OTHER): Payer: Medicare Other

## 2020-11-25 ENCOUNTER — Other Ambulatory Visit: Payer: Self-pay

## 2020-11-25 DIAGNOSIS — Z0000 Encounter for general adult medical examination without abnormal findings: Secondary | ICD-10-CM | POA: Diagnosis not present

## 2020-11-25 NOTE — Progress Notes (Signed)
Virtual Visit via Telephone Note  I connected with  Rachel Blair on 11/25/20 at 10:15 AM EDT by telephone and verified that I am speaking with the correct person using two identifiers.  Location: Patient: Home Provider: Office Persons participating in the virtual visit: patient/Nurse Health Advisor   I discussed the limitations, risks, security and privacy concerns of performing an evaluation and management service by telephone and the availability of in person appointments. The patient expressed understanding and agreed to proceed.  Interactive audio and video telecommunications were attempted between this nurse and patient, however failed, due to patient having technical difficulties OR patient did not have access to video capability.  We continued and completed visit with audio only.  Some vital signs may be absent or patient reported.    Marzella Schlein, LPN   Subjective:   Rachel Blair is a 70 y.o. female who presents for Medicare Annual (Subsequent) preventive examination.  Review of Systems     Cardiac Risk Factors include: advanced age (>12men, >25 women);hypertension;dyslipidemia;obesity (BMI >30kg/m2)     Objective:    Today's Vitals   11/25/20 1018  PainSc: 2    There is no height or weight on file to calculate BMI.  Advanced Directives 11/25/2020 05/27/2015 05/23/2015 07/18/2014  Does Patient Have a Medical Advance Directive? No No No No  Would patient like information on creating a medical advance directive? Yes (MAU/Ambulatory/Procedural Areas - Information given) No - patient declined information - No - patient declined information    Current Medications (verified) Outpatient Encounter Medications as of 11/25/2020  Medication Sig   cycloSPORINE (RESTASIS) 0.05 % ophthalmic emulsion Place 1 drop into both eyes 2 (two) times daily.   hydrochlorothiazide (HYDRODIURIL) 25 MG tablet TAKE 1 TABLET BY MOUTH DAILY   LORazepam (ATIVAN) 0.5 MG tablet TAKE 1 TO 2 TABLETS  BY MOUTH BEFORE FLIGHT AS NEEDED AND AS DIRECTED. DO NOT TAKE WITH AMBIEN   zolpidem (AMBIEN) 10 MG tablet Take 1 tablet (10 mg total) by mouth at bedtime as needed. for sleep   [DISCONTINUED] zolpidem (AMBIEN) 10 MG tablet TAKE 1 TABLET(10 MG) BY MOUTH AT BEDTIME AS NEEDED FOR SLEEP   nirmatrelvir/ritonavir EUA (PAXLOVID) TABS Take 3 tablets by mouth twice daily (Patient not taking: Reported on 11/25/2020)   No facility-administered encounter medications on file as of 11/25/2020.    Allergies (verified) Amphetamine-dextroamphet er, Duloxetine, Hydrocodone-acetaminophen, Losartan, and Morphine sulfate   History: Past Medical History:  Diagnosis Date   Allergy    Arthritis    CHEST DISCOMFORT 08/30/2007   Qualifier: Diagnosis of  By: Fabian Sharp MD, Neta Mends    Chest pain    atypical   History of pneumonia    Hypertension    Nasal septal deviation    OBSTRUCTIVE SLEEP APNEA 08/30/2007   Qualifier: Diagnosis of  By: Fabian Sharp MD, Neta Mends    Odontogenic cyst    of left maxillary sinus   OSA (obstructive sleep apnea)    moderatley severe, uses CPAP nightly   Trigger thumb of left hand    Past Surgical History:  Procedure Laterality Date   biopsy and bone graft of left maxillary sinus odontogenic cyst     CHOLECYSTECTOMY     left thumb trigger finger release Right    TONSILLECTOMY     TRIGGER FINGER RELEASE Left 05/27/2015   Procedure: RELEASE TRIGGER FINGER/A-1 PULLEY LEFT THUMB;  Surgeon: Cindee Salt, MD;  Location: [facility];  Service: Orthopedics;  Laterality: Left;  TUBAL LIGATION     Family History  Problem Relation Age of Onset   Heart disease Mother    Stroke Father    Heart failure Father    Emphysema Father    Rheum arthritis Daughter    Breast cancer Paternal Aunt    Snoring Brother    Lung cancer Brother    Social History   Socioeconomic History   Marital status: Married    Spouse name: Not on file   Number of children: 3   Years of education: Not  on file   Highest education level: Not on file  Occupational History   Occupation: retired  Tobacco Use   Smoking status: Former    Packs/day: 1.00    Years: 30.00    Pack years: 30.00    Types: Cigarettes    Quit date: 06/14/1998    Years since quitting: 22.4   Smokeless tobacco: Never  Vaping Use   Vaping Use: Never used  Substance and Sexual Activity   Alcohol use: Yes    Comment: once a month   Drug use: No   Sexual activity: Yes    Birth control/protection: None  Other Topics Concern   Not on file  Social History Narrative   Married   Regular exercise   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     Ex smoker  Stopped 2000   G3P3   2 cats and a Nurse, mental health          Social Determinants of Corporate investment banker Strain: Low Risk    Difficulty of Paying Living Expenses: Not hard at [employer] Insecurity: No Food Insecurity   Worried About Programme researcher, broadcasting/film/video in the Last Year: Never true   Barista in the Last Year: Never true  Transportation Needs: No Transportation Needs   Lack of Transportation (Medical): No   Lack of Transportation (Non-Medical): No  Physical Activity: Inactive   Days of Exercise per Week: 0 days   Minutes of Exercise per Session: 0 min  Stress: No Stress Concern Present   Feeling of Stress : Not at all  Social Connections: Moderately Integrated   Frequency of Communication with Friends and Family: More than three times a week   Frequency of Social Gatherings with Friends and Family: More than three times a week   Attends Religious Services: More than 4 times per year   Active Member of [employer] or Organizations: No   Attends Engineer, structural: Never   Marital Status: Married    Tobacco Counseling Counseling given: Not Answered   Clinical Intake:  Pre-visit preparation completed: Yes  Pain : 0-10 Pain Score: 2  Pain Type:  Chronic pain Pain Location: Knee Pain Orientation: Left Pain Descriptors / Indicators: Aching Pain Onset: More than a month ago Pain Frequency: Constant     BMI - recorded: 39.27 Nutritional Status: BMI > 30  Obese Nutritional Risks: None Diabetes: No  How often do you need to have someone help you when you read instructions, pamphlets, or other written materials from your doctor or pharmacy?: 1 - Never  Diabetic?No  Interpreter Needed?: No  Information entered by :: Lanier Ensign, LPN   Activities of Daily Living In your present state of health, do you have any difficulty performing the following activities: 11/25/2020  Hearing? N  Vision? N  Difficulty concentrating or making decisions? N  Walking or climbing stairs? Y  Comment slowly  Dressing or bathing? N  Doing errands, shopping? N  Preparing Food and eating ? N  Using the Toilet? N  In the past six months, have you accidently leaked urine? N  Do you have problems with loss of bowel control? N  Managing your Medications? N  Managing your Finances? N  Housekeeping or managing your Housekeeping? N  Some recent data might be hidden    Patient Care Team: Panosh, Neta Mends, MD as PCP - General Oretha Milch, MD as Consulting Physician (Pulmonary Disease)  Indicate any recent Medical Services you may have received from other than [facility] providers in the past year (date may be approximate).     Assessment:   This is a routine wellness examination for Rachel Blair.  Hearing/Vision screen Hearing Screening - Comments:: Pt denies any hearing issues  Vision Screening - Comments:: Pt follows up with Dr Darel Hong for annual eye exam  Dietary issues and exercise activities discussed: Current Exercise Habits: The patient does not participate in regular exercise at present (stay active)   Goals Addressed             This Visit's Progress    Patient Stated       Lose 100lbs        Depression Screen PHQ 2/9 Scores  11/25/2020 12/03/2019 03/06/2019 11/21/2017 11/21/2017 11/22/2016 08/17/2016  PHQ - 2 Score 0 0 0 0 0 0 0  PHQ- 9 Score - 0 - 0 0 - -    Fall Risk Fall Risk  11/25/2020 12/03/2019 03/06/2019 11/22/2016 08/17/2016  Falls in the past year? 0 0 0 No No  Number falls in past yr: 0 - 0 - -  Injury with Fall? 0 - 0 - -  Risk for fall due to : Impaired vision - - - -  Follow up Falls prevention discussed - - - -    FALL RISK PREVENTION PERTAINING TO THE HOME:  Any stairs in or around the home? Yes  If so, are there any without handrails? No  Home free of loose throw rugs in walkways, pet beds, electrical cords, etc? Yes  Adequate lighting in your home to reduce risk of falls? Yes   ASSISTIVE DEVICES UTILIZED TO PREVENT FALLS:  Life alert? No  Use of a cane, walker or w/c? No  Grab bars in the bathroom? No  Shower chair or bench in shower? No  Elevated toilet seat or a handicapped toilet? No   TIMED UP AND GO:  Was the test performed? No .     Cognitive Function:     6CIT Screen 11/25/2020  What Year? 0 points  What month? 0 points  What time? 0 points  Count back from 20 0 points  Months in reverse 0 points  Repeat phrase 0 points  Total Score 0    Immunizations Immunization History  Administered Date(s) Administered   Influenza Split 02/21/2012, 03/14/2016   Influenza, High Dose Seasonal PF 04/19/2017, 03/14/2018, 02/27/2019   Influenza,inj,Quad PF,6+ Mos 02/20/2013, 02/25/2014, 04/08/2015, 05/02/2018   Moderna Sars-Covid-2 Vaccination 07/13/2019, 08/10/2019, 03/07/2020   Pneumococcal Conjugate-13 11/22/2016   Pneumococcal Polysaccharide-23 02/20/2013   Td 06/09/2010   Zoster, Live 11/15/2011    TDAP status: Due, Education has been provided regarding the importance of this vaccine. Advised may receive this vaccine at local pharmacy or Health Dept. Aware to provide a copy of the vaccination record  if obtained from local pharmacy or Health Dept. Verbalized acceptance and  understanding.  Flu Vaccine status: Due, Education has been provided regarding the importance of this vaccine. Advised may receive this vaccine at local pharmacy or Health Dept. Aware to provide a copy of the vaccination record if obtained from local pharmacy or Health Dept. Verbalized acceptance and understanding.  Pneumococcal vaccine status: Due, Education has been provided regarding the importance of this vaccine. Advised may receive this vaccine at local pharmacy or Health Dept. Aware to provide a copy of the vaccination record if obtained from local pharmacy or Health Dept. Verbalized acceptance and understanding.  Covid-19 vaccine status: Completed vaccines  Qualifies for Shingles Vaccine? Yes   Zostavax completed Yes   Shingrix Completed?: Yes pt will bring dates 11/26/20  Screening Tests Health Maintenance  Topic Date Due   Zoster Vaccines- Shingrix (1 of 2) Never done   PNA vac Low Risk Adult (2 of 2 - PPSV23) 02/20/2018   COVID-19 Vaccine (4 - Booster for Moderna series) 06/06/2020   TETANUS/TDAP  06/09/2020   INFLUENZA VACCINE  01/12/2021   COLONOSCOPY (Pts [id number] Insurance coverage will need to be confirmed)  02/23/2021   MAMMOGRAM  10/03/2021   DEXA SCAN  Completed   Hepatitis C Screening  Completed   HPV VACCINES  Aged Out    Health Maintenance  Health Maintenance Due  Topic Date Due   Zoster Vaccines- Shingrix (1 of 2) Never done   PNA vac Low Risk Adult (2 of 2 - PPSV23) 02/20/2018   COVID-19 Vaccine (4 - Booster for Moderna series) 06/06/2020   TETANUS/TDAP  06/09/2020    Colon cancer screening: pt interested in cologuard information for 02/2021  Mammogram status: Completed 11/24/20. Repeat every year  Bone Density status: Completed 12/20/16. Results reflect: Bone density results: NORMAL. Repeat every 3-5 years.   Additional Screening:  Hepatitis C Screening:  Completed 11/22/16  Vision Screening: Recommended annual ophthalmology exams for early detection  of glaucoma and other disorders of the eye. Is the patient up to date with their annual eye exam?  Yes  Who is the provider or what is the name of the office in which the patient attends annual eye exams? Mr Darel [facility]  If pt is not established with a provider, would they like to be referred to a provider to establish care? No .   Dental Screening: Recommended annual dental exams for proper oral hygiene  Community Resource Referral / Chronic Care Management: CRR required this visit?  No   CCM required this visit?  No      Plan:     I have personally reviewed and noted the following in the patient's chart:   Medical and social history Use of alcohol, tobacco or illicit drugs  Current medications and supplements including opioid prescriptions.  Functional ability and status Nutritional status Physical activity Advanced directives List of other physicians Hospitalizations, surgeries, and ER visits in previous 12 months Vitals Screenings to include cognitive, depression, and falls Referrals and appointments  In addition, I have reviewed and discussed with patient certain preventive protocols, quality metrics, and best practice recommendations. A written personalized care plan for preventive services as well as general preventive health recommendations were provided to patient.     Marzella Schleinina H Phila Shoaf, LPN   9/14/78296/14/2022   Nurse Notes: None

## 2020-11-25 NOTE — Patient Instructions (Signed)
Rachel Blair ,  Thank you for taking time to come for your Medicare Wellness Visit. I appreciate your ongoing commitment to your health goals. Please review the following plan we discussed and let me know if I can assist you in the future.   Screening recommendations/referrals: Colonoscopy: Done 02/24/11 pt will get more info on cologuard from Dr Fabian Sharp  Mammogram: Done 11/24/20 repeat every year Bone Density: Done 12/20/16 Recommended yearly ophthalmology/optometry visit for glaucoma screening and checkup Recommended yearly dental visit for hygiene and checkup  Vaccinations: Influenza vaccine: Due 01/12/21 Pneumococcal vaccine: Due  Tdap vaccine: Due  Shingles vaccine: Pt will get dates of completion   Covid-19:Completed 1/29, 2/26, & 03/07/20   Advanced directives: Advance directive discussed with you today. I have provided a copy for you to complete at home and have notarized. Once this is complete please bring a copy in to our office so we can scan it into your chart.   Conditions/risks identified: lose 100 lbs   Next appointment: Follow up in one year for your annual wellness visit    Preventive Care 65 Years and Older, Female Preventive care refers to lifestyle choices and visits with your health care provider that can promote health and wellness. What does preventive care include? A yearly physical exam. This is also called an annual well check. Dental exams once or twice a year. Routine eye exams. Ask your health care provider how often you should have your eyes checked. Personal lifestyle choices, including: Daily care of your teeth and gums. Regular physical activity. Eating a healthy diet. Avoiding tobacco and drug use. Limiting alcohol use. Practicing safe sex. Taking low-dose aspirin every day. Taking vitamin and mineral supplements as recommended by your health care provider. What happens during an annual well check? The services and screenings done by your health  care provider during your annual well check will depend on your age, overall health, lifestyle risk factors, and family history of disease. Counseling  Your health care provider may ask you questions about your: Alcohol use. Tobacco use. Drug use. Emotional well-being. Home and relationship well-being. Sexual activity. Eating habits. History of falls. Memory and ability to understand (cognition). Work and work Astronomer. Reproductive health. Screening  You may have the following tests or measurements: Height, weight, and BMI. Blood pressure. Lipid and cholesterol levels. These may be checked every 5 years, or more frequently if you are over 14 years old. Skin check. Lung cancer screening. You may have this screening every year starting at age 80 if you have a 30-pack-year history of smoking and currently smoke or have quit within the past 15 years. Fecal occult blood test (FOBT) of the stool. You may have this test every year starting at age 39. Flexible sigmoidoscopy or colonoscopy. You may have a sigmoidoscopy every 5 years or a colonoscopy every 10 years starting at age 20. Hepatitis C blood test. Hepatitis B blood test. Sexually transmitted disease (STD) testing. Diabetes screening. This is done by checking your blood sugar (glucose) after you have not eaten for a while (fasting). You may have this done every 1-3 years. Bone density scan. This is done to screen for osteoporosis. You may have this done starting at age 35. Mammogram. This may be done every 1-2 years. Talk to your health care provider about how often you should have regular mammograms. Talk with your health care provider about your test results, treatment options, and if necessary, the need for more tests. Vaccines  Your health care provider  may recommend certain vaccines, such as: Influenza vaccine. This is recommended every year. Tetanus, diphtheria, and acellular pertussis (Tdap, Td) vaccine. You may need a Td  booster every 10 years. Zoster vaccine. You may need this after age 61. Pneumococcal 13-valent conjugate (PCV13) vaccine. One dose is recommended after age 32. Pneumococcal polysaccharide (PPSV23) vaccine. One dose is recommended after age 12. Talk to your health care provider about which screenings and vaccines you need and how often you need them. This information is not intended to replace advice given to you by your health care provider. Make sure you discuss any questions you have with your health care provider. Document Released: 06/27/2015 Document Revised: 02/18/2016 Document Reviewed: 04/01/2015 Elsevier Interactive Patient Education  2017 [employer] Prevention in the Home Falls can cause injuries. They can happen to people of all ages. There are many things you can do to make your home safe and to help prevent falls. What can I do on the outside of my home? Regularly fix the edges of walkways and driveways and fix any cracks. Remove anything that might make you trip as you walk through a door, such as a raised step or threshold. Trim any bushes or trees on the path to your home. Use bright outdoor lighting. Clear any walking paths of anything that might make someone trip, such as rocks or tools. Regularly check to see if handrails are loose or broken. Make sure that both sides of any steps have handrails. Any raised decks and porches should have guardrails on the edges. Have any leaves, snow, or ice cleared regularly. Use sand or salt on walking paths during winter. Clean up any spills in your garage right away. This includes oil or grease spills. What can I do in the bathroom? Use night lights. Install grab bars by the toilet and in the tub and shower. Do not use towel bars as grab bars. Use non-skid mats or decals in the tub or shower. If you need to sit down in the shower, use a plastic, non-slip stool. Keep the floor dry. Clean up any water that spills on the floor  as soon as it happens. Remove soap buildup in the tub or shower regularly. Attach bath mats securely with double-sided non-slip rug tape. Do not have throw rugs and other things on the floor that can make you trip. What can I do in the bedroom? Use night lights. Make sure that you have a light by your bed that is easy to reach. Do not use any sheets or blankets that are too big for your bed. They should not hang down onto the floor. Have a firm chair that has side arms. You can use this for support while you get dressed. Do not have throw rugs and other things on the floor that can make you trip. What can I do in the kitchen? Clean up any spills right away. Avoid walking on wet floors. Keep items that you use a lot in easy-to-reach places. If you need to reach something above you, use a strong step stool that has a grab bar. Keep electrical cords out of the way. Do not use floor polish or wax that makes floors slippery. If you must use wax, use non-skid floor wax. Do not have throw rugs and other things on the floor that can make you trip. What can I do with my stairs? Do not leave any items on the stairs. Make sure that there are handrails on both sides  of the stairs and use them. Fix handrails that are broken or loose. Make sure that handrails are as long as the stairways. Check any carpeting to make sure that it is firmly attached to the stairs. Fix any carpet that is loose or worn. Avoid having throw rugs at the top or bottom of the stairs. If you do have throw rugs, attach them to the floor with carpet tape. Make sure that you have a light switch at the top of the stairs and the bottom of the stairs. If you do not have them, ask someone to add them for you. What else can I do to help prevent falls? Wear shoes that: Do not have high heels. Have rubber bottoms. Are comfortable and fit you well. Are closed at the toe. Do not wear sandals. If you use a stepladder: Make sure that it is  fully opened. Do not climb a closed stepladder. Make sure that both sides of the stepladder are locked into place. Ask someone to hold it for you, if possible. Clearly mark and make sure that you can see: Any grab bars or handrails. First and last steps. Where the edge of each step is. Use tools that help you move around (mobility aids) if they are needed. These include: Canes. Walkers. Scooters. Crutches. Turn on the lights when you go into a dark area. Replace any light bulbs as soon as they burn out. Set up your furniture so you have a clear path. Avoid moving your furniture around. If any of your floors are uneven, fix them. If there are any pets around you, be aware of where they are. Review your medicines with your doctor. Some medicines can make you feel dizzy. This can increase your chance of falling. Ask your doctor what other things that you can do to help prevent falls. This information is not intended to replace advice given to you by your health care provider. Make sure you discuss any questions you have with your health care provider. Document Released: 03/27/2009 Document Revised: 11/06/2015 Document Reviewed: 07/05/2014 Elsevier Interactive Patient Education  2017 [employer].

## 2020-11-26 ENCOUNTER — Ambulatory Visit (INDEPENDENT_AMBULATORY_CARE_PROVIDER_SITE_OTHER): Payer: Medicare Other | Admitting: Internal Medicine

## 2020-11-26 ENCOUNTER — Encounter: Payer: Self-pay | Admitting: Internal Medicine

## 2020-11-26 VITALS — BP 126/62 | HR 77 | Temp 98.7°F | Ht 65.0 in | Wt 233.6 lb

## 2020-11-26 DIAGNOSIS — Z8616 Personal history of COVID-19: Secondary | ICD-10-CM

## 2020-11-26 DIAGNOSIS — Z79899 Other long term (current) drug therapy: Secondary | ICD-10-CM

## 2020-11-26 DIAGNOSIS — E119 Type 2 diabetes mellitus without complications: Secondary | ICD-10-CM

## 2020-11-26 DIAGNOSIS — Z6838 Body mass index (BMI) 38.0-38.9, adult: Secondary | ICD-10-CM

## 2020-11-26 DIAGNOSIS — R739 Hyperglycemia, unspecified: Secondary | ICD-10-CM

## 2020-11-26 LAB — POCT GLYCOSYLATED HEMOGLOBIN (HGB A1C): Hemoglobin A1C: 7 % — AB (ref 4.0–5.6)

## 2020-11-26 LAB — POCT GLUCOSE (DEVICE FOR HOME USE): POC Glucose: 115 mg/dl — AB (ref 70–99)

## 2020-11-26 MED ORDER — METFORMIN HCL ER 500 MG PO TB24
500.0000 mg | ORAL_TABLET | Freq: Every day | ORAL | 3 refills | Status: DC
Start: 2020-11-26 — End: 2021-01-30

## 2020-11-26 NOTE — Progress Notes (Signed)
Chief Complaint  Patient presents with   Blood sugars    HPI: Rachel Blair 70 y.o. come in for fu of elevated hga1c  7.1 sugar issues  Hasn't made  that many changes  Beverages  tea with sugar 2 and 1/2 soft drink   juice .  Snacks   popcorn watermelon.   Weight   steady. Neg fam hx DM except  father who was overweight . Not a lot of exercise  meniscus tear.   Recovered from covid  had se of paxlovid so stopped after 1-2 days ROS: See pertinent positives and negatives per HPI.  Past Medical History:  Diagnosis Date   Allergy    Arthritis    CHEST DISCOMFORT 08/30/2007   Qualifier: Diagnosis of  By: Fabian SharpPanosh MD, Neta MendsWanda K    Chest pain    atypical   History of pneumonia    Hypertension    Nasal septal deviation    OBSTRUCTIVE SLEEP APNEA 08/30/2007   Qualifier: Diagnosis of  By: Fabian SharpPanosh MD, Neta MendsWanda K    Odontogenic cyst    of left maxillary sinus   OSA (obstructive sleep apnea)    moderatley severe, uses CPAP nightly   Trigger thumb of left hand     Family History  Problem Relation Age of Onset   Heart disease Mother    Stroke Father    Heart failure Father    Emphysema Father    Rheum arthritis Daughter    Breast cancer Paternal Aunt    Snoring Brother    Lung cancer Brother     Social History   Socioeconomic History   Marital status: Married    Spouse name: Not on file   Number of children: 3   Years of education: Not on file   Highest education level: Not on file  Occupational History   Occupation: retired  Tobacco Use   Smoking status: Former    Packs/day: 1.00    Years: 30.00    Pack years: 30.00    Types: Cigarettes    Quit date: 06/14/1998    Years since quitting: 22.4   Smokeless tobacco: Never  Vaping Use   Vaping Use: Never used  Substance and Sexual Activity   Alcohol use: Yes    Comment: once a month   Drug use: No   Sexual activity: Yes    Birth control/protection: None  Other Topics Concern   Not on file  Social History Narrative    Married   Regular exercise   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adlitem     Ex smoker  Stopped 2000   G3P3   2 cats and a Nurse, mental healthdog          Social Determinants of Corporate investment bankerHealth   Financial Resource Strain: Low Risk    Difficulty of Paying Living Expenses: Not hard at [employer]  Food Insecurity: No Food Insecurity   Worried About Programme researcher, broadcasting/film/videounning Out of Food in the Last Year: Never true   Baristaan Out of Food in the Last Year: Never true  Transportation Needs: No Transportation Needs   Lack of Transportation (Medical): No   Lack of Transportation (Non-Medical): No  Physical Activity: Inactive   Days of Exercise per Week: 0 days   Minutes of Exercise per Session: 0 min  Stress: No Stress Concern Present   Feeling of Stress :  Not at all  Social Connections: Moderately Integrated   Frequency of Communication with Friends and Family: More than three times a week   Frequency of Social Gatherings with Friends and Family: More than three times a week   Attends Religious Services: More than 4 times per year   Active Member of [employer] or Organizations: No   Attends Banker Meetings: Never   Marital Status: Married    Outpatient Medications Prior to Visit  Medication Sig Dispense Refill   cycloSPORINE (RESTASIS) 0.05 % ophthalmic emulsion Place 1 drop into both eyes 2 (two) times daily.     hydrochlorothiazide (HYDRODIURIL) 25 MG tablet TAKE 1 TABLET BY MOUTH DAILY 90 tablet 2   LORazepam (ATIVAN) 0.5 MG tablet TAKE 1 TO 2 TABLETS BY MOUTH BEFORE FLIGHT AS NEEDED AND AS DIRECTED. DO NOT TAKE WITH AMBIEN 10 tablet 0   nirmatrelvir/ritonavir EUA (PAXLOVID) TABS Take 3 tablets by mouth twice daily 30 tablet 0   zolpidem (AMBIEN) 10 MG tablet Take 1 tablet (10 mg total) by mouth at bedtime as needed. for sleep 90 tablet 1   No facility-administered medications prior to visit.     EXAM:  BP 126/62 (BP  Location: Left Arm, Patient Position: Sitting, Cuff Size: Large)   Pulse 77   Temp 98.7 F (37.1 C) (Oral)   Ht 5\' 5"  (1.651 m)   Wt 233 lb 9.6 oz (106 kg)   SpO2 96%   BMI 38.87 kg/m   Body mass index is 38.87 kg/m. Wt Readings from Last 3 Encounters:  11/26/20 233 lb 9.6 oz (106 kg)  08/13/20 236 lb (107 kg)  12/03/19 234 lb (106.1 kg)    GENERAL: vitals reviewed and listed above, alert, oriented, appears well hydrated and in no acute distress HEENT: atraumatic, conjunctiva  clear, no obvious abnormalities on inspection of external nose and ears OP : masked  NECK: no obvious masses on inspection palpation   MS: moves all extremities without noticeable focal  abnormality PSYCH: pleasant and cooperative, no obvious depression or anxiety Lab Results  Component Value Date   WBC 6.3 07/23/2020   HGB 15.9 (H) 07/23/2020   HCT 45.5 07/23/2020   PLT 230.0 07/23/2020   GLUCOSE 132 (H) 07/23/2020   CHOL 176 07/23/2020   TRIG 185.0 (H) 07/23/2020   HDL 43.60 07/23/2020   LDLDIRECT 121.8 02/26/2011   LDLCALC 95 07/23/2020   ALT 47 (H) 07/23/2020   AST 33 07/23/2020   NA 137 07/23/2020   K 3.8 07/23/2020   CL 99 07/23/2020   CREATININE 0.85 07/23/2020   BUN 12 07/23/2020   CO2 32 07/23/2020   TSH 1.84 03/23/2019   HGBA1C 7.0 (A) 11/26/2020   BP Readings from Last 3 Encounters:  11/26/20 126/62  08/13/20 118/78  12/03/19 122/72    ASSESSMENT AND PLAN:  Discussed the following assessment and plan:  Diabetes mellitus, new onset (HCC) - a1c stabael 7.1   Hyperglycemia - Plan: POCT Glucose (Device for Home Use), POCT glycosylated hemoglobin (Hb A1C)  Medication management  Personal history of COVID-19 - april 2022 paxlovid  Class 2 severe obesity with serious comorbidity and body mass index (BMI) of 38.0 to 38.9 in adult, unspecified obesity type (HCC) Review counsel plan and fu  31 minutes  Counseled  about  diet   changes  get rid of sugar beverages as possible     metformin benefit more than risk Plan   fu labs in 3 months .  And  then ROV.   For now nutritional referral declined   will work on things first and  investigate -Patient advised to return or notify health care team  if  new concerns arise.  Patient Instructions  Your  sugars are now in diabetes .  Range  Cut out  or limit  the  sweet sugar beverages  as we discussed.  Begin metformin 500 mg once a day .  Plan ROV in 3 months    Diabetes Mellitus and Standards of Medical Care Living with and managing diabetes (diabetes mellitus) can be complicated. Your diabetes treatment may be managed by a team of health care providers, including: A physician who specializes in diabetes (endocrinologist). You might also have visits with a nurse practitioner or physician assistant. Nurses. A registered dietitian. A certified diabetes care and education specialist. An exercise specialist. A pharmacist. An eye doctor. A foot specialist (podiatrist). A dental care provider. A primary care provider. A mental health care provider. How to manage your diabetes You can do many things to successfully manage your diabetes. Your health care providers will follow guidelines to help you get the best quality of care. Here are general guidelines for your diabetes management plan. Your health careproviders may give you more specific instructions. Physical exams When you are diagnosed with diabetes, and each year after that, your health care provider will ask about your medical and family history. You will have a physical exam, which may include: Measuring your height, weight, and body mass index (BMI). Checking your blood pressure. This will be done at every routine medical visit. Your target blood pressure may vary depending on your medical conditions, your age, and other factors. A thyroid exam. A skin exam. Screening for nerve damage (peripheral neuropathy). This may include checking the pulse in your legs and  feet and the level of sensation in your hands and feet. A foot exam to inspect the structure and skin of your feet, including checking for cuts, bruises, redness, blisters, sores, or other problems. Screening for blood vessel (vascular) problems. This may include checking the pulse in your legs and feet and checking your temperature. Blood tests Depending on your treatment plan and your personal needs, you may have the following tests: Hemoglobin A1C (HbA1C). This test provides information about blood sugar (glucose) control over the previous 2-3 months. It is used to adjust your treatment plan, if needed. This test will be done: At least 2 times a year, if you are meeting your treatment goals. 4 times a year, if you are not meeting your treatment goals or if your goals have changed. Lipid testing, including total cholesterol, LDL and HDL cholesterol, and triglyceride levels. The goal for LDL is less than 100 mg/dL (5.5 mmol/L). If you are at high risk for complications, the goal is less than 70 mg/dL (3.9 mmol/L). The goal for HDL is 40 mg/dL (2.2 mmol/L) or higher for men, and 50 mg/dL (2.8 mmol/L) or higher for women. An HDL cholesterol of 60 mg/dL (3.3 mmol/L) or higher gives some protection against heart disease. The goal for triglycerides is less than 150 mg/dL (8.3 mmol/L). Liver function tests. Kidney function tests. Thyroid function tests.  Dental and eye exams  Visit your dentist two times a year. If you have type 1 diabetes, your health care provider may recommend an eye exam within 5 years after you are diagnosed, and then once a year after your first exam. For children with type 1 diabetes, the health care provider may  recommend an eye exam when your child is age 82 or older and has had diabetes for 3-5 years. After the first exam, your child should get an eye exam once a year. If you have type 2 diabetes, your health care provider may recommend an eye exam as soon as you are  diagnosed, and then every 1-2 years after your first exam.  Immunizations A yearly flu (influenza) vaccine is recommended annually for everyone 6 months or older. This is especially important if you have diabetes. The pneumonia (pneumococcal) vaccine is recommended for everyone 2 years or older who has diabetes. If you are age 54 or older, you may get the pneumonia vaccine as a series of two separate shots. The hepatitis B vaccine is recommended for adults shortly after being diagnosed with diabetes. Adults and children with diabetes should receive all other vaccines according to age-specific recommendations from the Centers for Disease Control andPrevention (CDC). Mental and emotional health Screening for symptoms of eating disorders, anxiety, and depression is recommended at the time of diagnosis and after as needed. If your screening shows that you have symptoms, you may need more evaluation. You may work with [facility] [facility] health care provider. Follow these instructions at home: Treatment plan You will monitor your blood glucose levels and may give yourself insulin. Your treatment plan will be reviewed at every medical visit. You and your health care provider will discuss: How you are taking your medicines, including insulin. Any side effects you have. Your blood glucose level target goals. How often you monitor your blood glucose level. Lifestyle habits, such as activity level and tobacco, alcohol, and substance use. Education Your health care provider will assess how well you are monitoring your blood glucose levels and whether you are taking your insulin and medicines correctly. He or she may refer you to: A certified diabetes care and education specialist to manage your diabetes throughout your life, starting at diagnosis. A registered dietitian who can create and review your personal nutrition plan. An exercise specialist who can discuss your activity level and exercise plan. General  instructions Take over-the-counter and prescription medicines only as told by your health care provider. Keep all follow-up visits. This is important. Where to find support There are many diabetes support networks, including: American Diabetes Association (ADA): diabetes.org Defeat Diabetes Foundation: defeatdiabetes.org Where to find more information American Diabetes Association (ADA): www.diabetes.org Association of Diabetes Care & Education Specialists (ADCES): diabeteseducator.org International Diabetes Federation (IDF): http://hill.biz/ Summary Managing diabetes (diabetes mellitus) can be complicated. Your diabetes treatment may be managed by a team of health care providers. Your health care providers follow guidelines to help you get the best quality care. You should have physical exams, blood tests, blood pressure monitoring, immunizations, and screening tests regularly. Stay updated on how to manage your diabetes. Your health care providers may also give you more specific instructions based on your individual health. This information is not intended to replace advice given to you by your health care provider. Make sure you discuss any questions you have with your healthcare provider. Document Revised: 12/06/2019 Document Reviewed: 12/06/2019 Elsevier Patient Education  2022 [employer].     [place]. Lyrick Lagrand M.D.

## 2020-11-26 NOTE — Patient Instructions (Signed)
Your  sugars are now in diabetes .  Range  Cut out  or limit  the  sweet sugar beverages  as we discussed.  Begin metformin 500 mg once a day .  Plan ROV in 3 months    Diabetes Mellitus and Standards of Medical Care Living with and managing diabetes (diabetes mellitus) can be complicated. Your diabetes treatment may be managed by a team of health care providers, including: A physician who specializes in diabetes (endocrinologist). You might also have visits with a nurse practitioner or physician assistant. Nurses. A registered dietitian. A certified diabetes care and education specialist. An exercise specialist. A pharmacist. An eye doctor. A foot specialist (podiatrist). A dental care provider. A primary care provider. A mental health care provider. How to manage your diabetes You can do many things to successfully manage your diabetes. Your health care providers will follow guidelines to help you get the best quality of care. Here are general guidelines for your diabetes management plan. Your health careproviders may give you more specific instructions. Physical exams When you are diagnosed with diabetes, and each year after that, your health care provider will ask about your medical and family history. You will have a physical exam, which may include: Measuring your height, weight, and body mass index (BMI). Checking your blood pressure. This will be done at every routine medical visit. Your target blood pressure may vary depending on your medical conditions, your age, and other factors. A thyroid exam. A skin exam. Screening for nerve damage (peripheral neuropathy). This may include checking the pulse in your legs and feet and the level of sensation in your hands and feet. A foot exam to inspect the structure and skin of your feet, including checking for cuts, bruises, redness, blisters, sores, or other problems. Screening for blood vessel (vascular) problems. This may include  checking the pulse in your legs and feet and checking your temperature. Blood tests Depending on your treatment plan and your personal needs, you may have the following tests: Hemoglobin A1C (HbA1C). This test provides information about blood sugar (glucose) control over the previous 2-3 months. It is used to adjust your treatment plan, if needed. This test will be done: At least 2 times a year, if you are meeting your treatment goals. 4 times a year, if you are not meeting your treatment goals or if your goals have changed. Lipid testing, including total cholesterol, LDL and HDL cholesterol, and triglyceride levels. The goal for LDL is less than 100 mg/dL (5.5 mmol/L). If you are at high risk for complications, the goal is less than 70 mg/dL (3.9 mmol/L). The goal for HDL is 40 mg/dL (2.2 mmol/L) or higher for men, and 50 mg/dL (2.8 mmol/L) or higher for women. An HDL cholesterol of 60 mg/dL (3.3 mmol/L) or higher gives some protection against heart disease. The goal for triglycerides is less than 150 mg/dL (8.3 mmol/L). Liver function tests. Kidney function tests. Thyroid function tests.  Dental and eye exams  Visit your dentist two times a year. If you have type 1 diabetes, your health care provider may recommend an eye exam within 5 years after you are diagnosed, and then once a year after your first exam. For children with type 1 diabetes, the health care provider may recommend an eye exam when your child is age 87 or older and has had diabetes for 3-5 years. After the first exam, your child should get an eye exam once a year. If you have type 2  diabetes, your health care provider may recommend an eye exam as soon as you are diagnosed, and then every 1-2 years after your first exam.  Immunizations A yearly flu (influenza) vaccine is recommended annually for everyone 6 months or older. This is especially important if you have diabetes. The pneumonia (pneumococcal) vaccine is recommended  for everyone 2 years or older who has diabetes. If you are age 81 or older, you may get the pneumonia vaccine as a series of two separate shots. The hepatitis B vaccine is recommended for adults shortly after being diagnosed with diabetes. Adults and children with diabetes should receive all other vaccines according to age-specific recommendations from the Centers for Disease Control andPrevention (CDC). Mental and emotional health Screening for symptoms of eating disorders, anxiety, and depression is recommended at the time of diagnosis and after as needed. If your screening shows that you have symptoms, you may need more evaluation. You may work with [employer] [facility] health care provider. Follow these instructions at home: Treatment plan You will monitor your blood glucose levels and may give yourself insulin. Your treatment plan will be reviewed at every medical visit. You and your health care provider will discuss: How you are taking your medicines, including insulin. Any side effects you have. Your blood glucose level target goals. How often you monitor your blood glucose level. Lifestyle habits, such as activity level and tobacco, alcohol, and substance use. Education Your health care provider will assess how well you are monitoring your blood glucose levels and whether you are taking your insulin and medicines correctly. He or she may refer you to: A certified diabetes care and education specialist to manage your diabetes throughout your life, starting at diagnosis. A registered dietitian who can create and review your personal nutrition plan. An exercise specialist who can discuss your activity level and exercise plan. General instructions Take over-the-counter and prescription medicines only as told by your health care provider. Keep all follow-up visits. This is important. Where to find support There are many diabetes support networks, including: American Diabetes Association (ADA):  diabetes.org Defeat Diabetes Foundation: defeatdiabetes.org Where to find more information American Diabetes Association (ADA): www.diabetes.org Association of Diabetes Care & Education Specialists (ADCES): diabeteseducator.org International Diabetes Federation (IDF): http://hill.biz/ Summary Managing diabetes (diabetes mellitus) can be complicated. Your diabetes treatment may be managed by a team of health care providers. Your health care providers follow guidelines to help you get the best quality care. You should have physical exams, blood tests, blood pressure monitoring, immunizations, and screening tests regularly. Stay updated on how to manage your diabetes. Your health care providers may also give you more specific instructions based on your individual health. This information is not intended to replace advice given to you by your health care provider. Make sure you discuss any questions you have with your healthcare provider. Document Revised: 12/06/2019 Document Reviewed: 12/06/2019 Elsevier Patient Education  2022 [employer].

## 2021-01-12 ENCOUNTER — Other Ambulatory Visit: Payer: Self-pay

## 2021-01-12 NOTE — Progress Notes (Signed)
Chief Complaint  Patient presents with   Back Pain    Patient complains of low back pain, Patient denies any known injury, x2 weeks, Tried Tylenol and Advil with little relief.Patient states she feel electric impulses when her back is touched.     HPI: Rachel Blair 70 y.o. come in for  Back ache  2.5 weeks ago  cresendo gradually worse   , seems to be localized just left of the spine occasionally goes down to the buttocks but not the leg she is battling a left knee cartilage problem and trying to decide whether to have cartilage surgery. New onset dm June cut out sugars but did not start the metformin to the end of July. Pain is about a 5 out of 10 but not relieved by Advil Aleve Tylenol.  Does do a lot of sitting and it is worse with sitting. Remote history of back pain and has had physical therapy remembering in the past. No UTI symptoms hematuria fever GI related. No specific weakness but does feel different on the left side. Tried a leftover muscle relaxant Flexeril and had funny dreams when she takes the Ambien and her sleep apnea. Hx meniscus  left knee . ROS: See pertinent positives and negatives per HPI.    Past Medical History:  Diagnosis Date   Allergy    Arthritis    CHEST DISCOMFORT 08/30/2007   Qualifier: Diagnosis of  By: Fabian Sharp MD, Neta Mends    Chest pain    atypical   History of pneumonia    Hypertension    Nasal septal deviation    OBSTRUCTIVE SLEEP APNEA 08/30/2007   Qualifier: Diagnosis of  By: Fabian Sharp MD, Neta Mends    Odontogenic cyst    of left maxillary sinus   OSA (obstructive sleep apnea)    moderatley severe, uses CPAP nightly   Trigger thumb of left hand     Family History  Problem Relation Age of Onset   Heart disease Mother    Stroke Father    Heart failure Father    Emphysema Father    Rheum arthritis Daughter    Breast cancer Paternal Aunt    Snoring Brother    Lung cancer Brother     Social History   Socioeconomic History   Marital  status: Married    Spouse name: Not on file   Number of children: 3   Years of education: Not on file   Highest education level: Not on file  Occupational History   Occupation: retired  Tobacco Use   Smoking status: Former    Packs/day: 1.00    Years: 30.00    Pack years: 30.00    Types: Cigarettes    Quit date: 06/14/1998    Years since quitting: 22.6   Smokeless tobacco: Never  Vaping Use   Vaping Use: Never used  Substance and Sexual Activity   Alcohol use: Yes    Comment: once a month   Drug use: No   Sexual activity: Yes    Birth control/protection: None  Other Topics Concern   Not on file  Social History Narrative   Married   Regular exercise   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    Working as guardian adlitem     Ex smoker  Stopped 2000   G3P3   2 cats and a Nurse, mental health  Social Determinants of Health   Financial Resource Strain: Low Risk    Difficulty of Paying Living Expenses: Not hard at all  Food Insecurity: No Food Insecurity   Worried About Programme researcher, broadcasting/film/video in the Last Year: Never true   Ran Out of Food in the Last Year: Never true  Transportation Needs: No Transportation Needs   Lack of Transportation (Medical): No   Lack of Transportation (Non-Medical): No  Physical Activity: Inactive   Days of Exercise per Week: 0 days   Minutes of Exercise per Session: 0 min  Stress: No Stress Concern Present   Feeling of Stress : Not at all  Social Connections: Moderately Integrated   Frequency of Communication with Friends and Family: More than three times a week   Frequency of Social Gatherings with Friends and Family: More than three times a week   Attends Religious Services: More than 4 times per year   Active Member of [employer] or Organizations: No   Attends Banker Meetings: Never   Marital Status: Married    Outpatient Medications Prior to Visit  Medication Sig  Dispense Refill   cycloSPORINE (RESTASIS) 0.05 % ophthalmic emulsion Place 1 drop into both eyes 2 (two) times daily.     hydrochlorothiazide (HYDRODIURIL) 25 MG tablet TAKE 1 TABLET BY MOUTH DAILY 90 tablet 2   LORazepam (ATIVAN) 0.5 MG tablet TAKE 1 TO 2 TABLETS BY MOUTH BEFORE FLIGHT AS NEEDED AND AS DIRECTED. DO NOT TAKE WITH AMBIEN 10 tablet 0   metFORMIN (GLUCOPHAGE-XR) 500 MG 24 hr tablet Take 1 tablet (500 mg total) by mouth daily with breakfast. 90 tablet 3   zolpidem (AMBIEN) 10 MG tablet Take 1 tablet (10 mg total) by mouth at bedtime as needed. for sleep 90 tablet 1   nirmatrelvir/ritonavir EUA (PAXLOVID) TABS Take 3 tablets by mouth twice daily 30 tablet 0   No facility-administered medications prior to visit.     EXAM:  BP 132/60 (BP Location: Left Arm, Patient Position: Sitting, Cuff Size: Large)   Pulse 79   Temp 98.3 F (36.8 C) (Oral)   Ht 5\' 5"  (1.651 m)   Wt 233 lb 12.8 oz (106.1 kg)   SpO2 96%   BMI 38.91 kg/m   Body mass index is 38.91 kg/m. Wt Readings from Last 3 Encounters:  01/13/21 233 lb 12.8 oz (106.1 kg)  11/26/20 233 lb 9.6 oz (106 kg)  08/13/20 236 lb (107 kg)    GENERAL: vitals reviewed and listed above, alert, oriented, appears well hydrated and in no acute distress HEENT: atraumatic, conjunctiva  clear, no obvious abnormalities on inspection of external nose and ears OP Mast NECK: no obvious masses on inspection palpation  LUNGS: No respiratory distress CV: HRRR, no clubbing cyanosis or  peripheral edema nl cap refill  MS: moves all extremities without noticeable focal  abnormality gait fairly even Spine no midline tenderness points to left paraspinal low back area Abdomen soft without again a megaly guarding or rebound Neurologic toe heel normal no obvious weakness negative SLR does prefer to bend knees when lying flat for comfort. PSYCH: pleasant and cooperative, no obvious depression or anxiety Lab Results  Component Value Date   WBC  6.3 07/23/2020   HGB 15.9 (H) 07/23/2020   HCT 45.5 07/23/2020   PLT 230.0 07/23/2020   GLUCOSE 132 (H) 07/23/2020   CHOL 176 07/23/2020   TRIG 185.0 (H) 07/23/2020   HDL 43.60 07/23/2020   LDLDIRECT 121.8 02/26/2011  LDLCALC 95 07/23/2020   ALT 47 (H) 07/23/2020   AST 33 07/23/2020   NA 137 07/23/2020   K 3.8 07/23/2020   CL 99 07/23/2020   CREATININE 0.85 07/23/2020   BUN 12 07/23/2020   CO2 32 07/23/2020   TSH 1.84 03/23/2019   HGBA1C 7.0 (A) 11/26/2020   BP Readings from Last 3 Encounters:  01/13/21 132/60  11/26/20 126/62  08/13/20 118/78  Point-of-care urinalysis clear.  ASSESSMENT AND PLAN:  Discussed the following assessment and plan:  Acute left-sided low back pain, unspecified whether sciatica present - Plan: POCT Urinalysis Dipstick (Automated) Appears to be mechanical with some sciatic type symptoms discussed physical therapy local care back hygiene First to try some exercises on her own and if progressing then go the other route. At this point imaging not indicated.  Can try tizanidine at night if helpful with CNS precautions. And estimated follow-up in a month if not significantly improved or other. Will have to have follow-up on her blood sugar at some point she began the metformin late.  -Patient advised to return or notify health care team  if  new concerns arise.  Patient Instructions  Back pain seems mechanical in nature  with pinched nerve .   May benefit from physical therapy . Can try mckenzie type exercises first adapted . Can try a different mild muscle relaxant at  night if needed. Let us know . Can try local otc patches    in short term.   If  persistent or progressive   plan reevaluation.   If getting fever  other sx  of concern plan reevaluation. Otherwise plan   OV  or  FU in about a  month (after  PT. ) or exercises .        Neta Mends. Toa Mia M.D.

## 2021-01-13 ENCOUNTER — Encounter: Payer: Self-pay | Admitting: Internal Medicine

## 2021-01-13 ENCOUNTER — Ambulatory Visit (INDEPENDENT_AMBULATORY_CARE_PROVIDER_SITE_OTHER): Payer: Medicare Other | Admitting: Internal Medicine

## 2021-01-13 VITALS — BP 132/60 | HR 79 | Temp 98.3°F | Ht 65.0 in | Wt 233.8 lb

## 2021-01-13 DIAGNOSIS — M545 Low back pain, unspecified: Secondary | ICD-10-CM

## 2021-01-13 LAB — POC URINALSYSI DIPSTICK (AUTOMATED)
Bilirubin, UA: NEGATIVE
Blood, UA: NEGATIVE
Glucose, UA: NEGATIVE
Ketones, UA: NEGATIVE
Leukocytes, UA: NEGATIVE
Nitrite, UA: NEGATIVE
Protein, UA: NEGATIVE
Spec Grav, UA: 1.02 (ref 1.010–1.025)
Urobilinogen, UA: 0.2 E.U./dL
pH, UA: 6 (ref 5.0–8.0)

## 2021-01-13 MED ORDER — TIZANIDINE HCL 2 MG PO TABS: 2.0000 mg | ORAL_TABLET | Freq: Every evening | ORAL | 0 refills | Status: DC | PRN

## 2021-01-13 NOTE — Patient Instructions (Addendum)
Back pain seems mechanical in nature  with pinched nerve .   May benefit from physical therapy . Can try mckenzie type exercises first adapted . Can try a different mild muscle relaxant at  night if needed. Let us know . Can try local otc patches    in short term.   If  persistent or progressive   plan reevaluation.   If getting fever  other sx  of concern plan reevaluation. Otherwise plan   OV  or  FU in about a  month (after  PT. ) or exercises .

## 2021-01-19 ENCOUNTER — Other Ambulatory Visit: Payer: Self-pay | Admitting: Internal Medicine

## 2021-01-20 ENCOUNTER — Other Ambulatory Visit: Payer: Self-pay

## 2021-01-20 MED ORDER — LORAZEPAM 0.5 MG PO TABS: ORAL_TABLET | ORAL | 0 refills | Status: DC

## 2021-01-20 NOTE — Addendum Note (Signed)
Addended by: Gershon Crane A on: 01/20/2021 04:25 PM   Modules accepted: Orders

## 2021-01-20 NOTE — Telephone Encounter (Signed)
Done

## 2021-01-30 NOTE — Telephone Encounter (Signed)
Sorry  you had possible side effect  We can try other medications   but ok to do diet changes in interim either way we can recheck your sugar  in September as planned  appt

## 2021-02-04 LAB — FECAL OCCULT BLOOD, GUAIAC: Fecal Occult Blood: NEGATIVE

## 2021-02-10 ENCOUNTER — Other Ambulatory Visit: Payer: Self-pay

## 2021-02-10 MED ORDER — HYDROCHLOROTHIAZIDE 25 MG PO TABS: 25.0000 mg | ORAL_TABLET | Freq: Every day | ORAL | 2 refills | Status: DC

## 2021-02-17 ENCOUNTER — Telehealth: Payer: Self-pay | Admitting: Internal Medicine

## 2021-02-17 ENCOUNTER — Other Ambulatory Visit: Payer: Self-pay | Admitting: Adult Health

## 2021-02-17 MED ORDER — ZOLPIDEM TARTRATE 10 MG PO TABS: ORAL_TABLET | ORAL | 3 refills | Status: DC

## 2021-02-17 NOTE — Telephone Encounter (Signed)
Patient called wanting a 90 day refill on zolpidem (AMBIEN) 10 MG tablet  Please send to   [id number] [place], [place] - [address] AT [facility] OF [address] Phone:  [phone]  Fax:  [phone]      Good callback number is [phone]  Please Advise

## 2021-02-18 NOTE — Telephone Encounter (Signed)
Noted  

## 2021-02-19 ENCOUNTER — Other Ambulatory Visit (INDEPENDENT_AMBULATORY_CARE_PROVIDER_SITE_OTHER): Payer: Medicare Other

## 2021-02-19 ENCOUNTER — Other Ambulatory Visit: Payer: Self-pay

## 2021-02-19 ENCOUNTER — Encounter: Payer: Self-pay | Admitting: Internal Medicine

## 2021-02-19 DIAGNOSIS — E119 Type 2 diabetes mellitus without complications: Secondary | ICD-10-CM | POA: Diagnosis not present

## 2021-02-19 LAB — BASIC METABOLIC PANEL
BUN: 16 mg/dL (ref 6–23)
CO2: 28 mEq/L (ref 19–32)
Calcium: 9.1 mg/dL (ref 8.4–10.5)
Chloride: 100 mEq/L (ref 96–112)
Creatinine, Ser: 0.8 mg/dL (ref 0.40–1.20)
GFR: 75.01 mL/min (ref >60.00)
Glucose, Bld: 116 mg/dL — ABNORMAL HIGH (ref 70–99)
Potassium: 3.5 mEq/L (ref 3.5–5.1)
Sodium: 139 mEq/L (ref 135–145)

## 2021-02-19 LAB — HEPATIC FUNCTION PANEL
ALT: 48 U/L — ABNORMAL HIGH (ref 0–35)
AST: 29 U/L (ref 0–37)
Albumin: 4.1 g/dL (ref 3.5–5.2)
Alkaline Phosphatase: 51 U/L (ref 39–117)
Bilirubin, Direct: 0.2 mg/dL (ref 0.0–0.3)
Total Bilirubin: 1.1 mg/dL (ref 0.2–1.2)
Total Protein: 6.2 g/dL (ref 6.0–8.3)

## 2021-02-19 LAB — LIPID PANEL
Cholesterol: 187 mg/dL (ref 0–200)
HDL: 49.1 mg/dL (ref >39.00)
NonHDL: 138.13
Total CHOL/HDL Ratio: 4
Triglycerides: 203 mg/dL — ABNORMAL HIGH (ref 0.0–149.0)
VLDL: 40.6 mg/dL — ABNORMAL HIGH (ref 0.0–40.0)

## 2021-02-19 LAB — LDL CHOLESTEROL, DIRECT: Direct LDL: 112 mg/dL

## 2021-02-21 NOTE — Progress Notes (Signed)
Labs will review at upcoming visit  can do ac at the visit

## 2021-02-25 ENCOUNTER — Ambulatory Visit: Payer: Medicare Other | Admitting: Internal Medicine

## 2021-03-02 NOTE — Progress Notes (Signed)
Chief Complaint  Patient presents with   Follow-up    HPI: Rachel Blair 70 y.o. come in for Chronic disease management    bg  a1c had risen  bug she couldn't tolerated metformin low dose 500 so working on  [employer] .  On Ambien and asks for 90 days if possible at next refill  Wants to delay  flu shot  Osa nasal irritation  sincetried metformin    ROS: See pertinent positives and negatives per HPI.  Past Medical History:  Diagnosis Date   Allergy    Arthritis    CHEST DISCOMFORT 08/30/2007   Qualifier: Diagnosis of  By: Fabian Sharp MD, Neta Mends    Chest pain    atypical   History of pneumonia    Hypertension    Nasal septal deviation    OBSTRUCTIVE SLEEP APNEA 08/30/2007   Qualifier: Diagnosis of  By: Fabian Sharp MD, Neta Mends    Odontogenic cyst    of left maxillary sinus   OSA (obstructive sleep apnea)    moderatley severe, uses CPAP nightly   Trigger thumb of left hand     Family History  Problem Relation Age of Onset   Heart disease Mother    Stroke Father    Heart failure Father    Emphysema Father    Rheum arthritis Daughter    Breast cancer Paternal Aunt    Snoring Brother    Lung cancer Brother     Social History   Socioeconomic History   Marital status: Married    Spouse name: Not on file   Number of children: 3   Years of education: Not on file   Highest education level: Not on file  Occupational History   Occupation: retired  Tobacco Use   Smoking status: Former    Packs/day: 1.00    Years: 30.00    Pack years: 30.00    Types: Cigarettes    Quit date: 06/14/1998    Years since quitting: 22.7   Smokeless tobacco: Never  Vaping Use   Vaping Use: Never used  Substance and Sexual Activity   Alcohol use: Yes    Comment: once a month   Drug use: No   Sexual activity: Yes    Birth control/protection: None  Other Topics Concern   Not on file  Social History Narrative   Married   Regular exercise   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    Working as guardian Firefighter     Ex smoker  Stopped 2000   G3P3   2 cats and a Nurse, mental health          Social Determinants of Corporate investment banker Strain: Low Risk    Difficulty of Paying Living Expenses: Not hard at [employer] Insecurity: No Food Insecurity   Worried About Programme researcher, broadcasting/film/video in the Last Year: Never true   Barista in the Last Year: Never true  Transportation Needs: No Transportation Needs   Lack of Transportation (Medical): No   Lack of Transportation (Non-Medical): No  Physical Activity: Inactive   Days of Exercise per Week: 0 days   Minutes of Exercise per Session: 0 min  Stress: No Stress Concern Present   Feeling of Stress : Not at all  Social Connections: Moderately Integrated   Frequency of Communication with Friends and Family: More than  three times a week   Frequency of Social Gatherings with Friends and Family: More than three times a week   Attends Religious Services: More than 4 times per year   Active Member of Clubs or Organizations: No   Attends Banker Meetings: Never   Marital Status: Married    Outpatient Medications Prior to Visit  Medication Sig Dispense Refill   cycloSPORINE (RESTASIS) 0.05 % ophthalmic emulsion Place 1 drop into both eyes 2 (two) times daily.     hydrochlorothiazide (HYDRODIURIL) 25 MG tablet Take 1 tablet (25 mg total) by mouth daily. 90 tablet 2   LORazepam (ATIVAN) 0.5 MG tablet TAKE 1 TO 2 TABLETS BY MOUTH BEFORE FLIGHT AS NEEDED AND AS DIRECTED. DO NOT TAKE WITH AMBIEN 10 tablet 0   metFORMIN (GLUCOPHAGE-XR) 500 MG 24 hr tablet Take 500 mg by mouth daily.     tiZANidine (ZANAFLEX) 2 MG tablet Take 1-2 tablets (2-4 mg total) by mouth at bedtime as needed for muscle spasms. 20 tablet 0   zolpidem (AMBIEN) 10 MG tablet TAKE 1 TABLET(10 MG) BY MOUTH AT BEDTIME AS NEEDED FOR SLEEP 30 tablet 3   No facility-administered medications prior to visit.      EXAM:  BP 116/64 (BP Location: Left Arm, Patient Position: Sitting, Cuff Size: Large)   Pulse 73   Temp 98.4 F (36.9 C) (Oral)   Ht 5\' 5"  (1.651 m)   Wt 232 lb 3.2 oz (105.3 kg)   SpO2 97%   BMI 38.64 kg/m   Body mass index is 38.64 kg/m. Wt Readings from Last 3 Encounters:  03/03/21 232 lb 3.2 oz (105.3 kg)  01/13/21 233 lb 12.8 oz (106.1 kg)  11/26/20 233 lb 9.6 oz (106 kg)    GENERAL: vitals reviewed and listed above, alert, oriented, appears well hydrated and in no acute distress HEENT: atraumatic, conjunctiva  clear, no obvious abnormalities on inspection of external nose and ears OP : masked  NECK: no obvious masses on inspection palpation  LUNGS: no resp distress  CV: HRRR, no clubbing cyanosis or  peripheral edema nl cap refill  MS: moves all extremities without noticeable focal  abnormality PSYCH: pleasant and cooperative, no obvious depression or anxiety Lab Results  Component Value Date   WBC 6.3 07/23/2020   HGB 15.9 (H) 07/23/2020   HCT 45.5 07/23/2020   PLT 230.0 07/23/2020   GLUCOSE 116 (H) 02/19/2021   CHOL 187 02/19/2021   TRIG 203.0 (H) 02/19/2021   HDL 49.10 02/19/2021   LDLDIRECT 112.0 02/19/2021   LDLCALC 95 07/23/2020   ALT 48 (H) 02/19/2021   AST 29 02/19/2021   NA 139 02/19/2021   K 3.5 02/19/2021   CL 100 02/19/2021   CREATININE 0.80 02/19/2021   BUN 16 02/19/2021   CO2 28 02/19/2021   TSH 1.84 03/23/2019   HGBA1C 6.5 (A) 03/03/2021   BP Readings from Last 3 Encounters:  03/03/21 116/64  01/13/21 132/60  11/26/20 126/62    ASSESSMENT AND PLAN:  Discussed the following assessment and plan:  Type 2 diabetes mellitus with hyperglycemia, without long-term current use of insulin (HCC) - a1c dropped from 7 to 6.5 w stopping sugar beverages ok to cont lsi and follow diet control - Plan: POCT glycosylated hemoglobin (Hb A1C), Lipid panel, Hemoglobin A1c, Hepatic function panel  Medication management - Plan: Lipid panel,  Hemoglobin A1c, Hepatic function panel  Essential hypertension - Plan: Lipid panel, Hemoglobin A1c, Hepatic function panel  Other insomnia - pt  aware of med risk   benefoit  ahs been on med for a while  Complex sleep apnea syndrome - nasal irritation recently  try saline gel or aquafor   Hyperlipidemia, unspecified hyperlipidemia type - trial crestor  - Plan: Lipid panel, Hemoglobin A1c, Hepatic function panel  -Patient advised to return or notify health care team  if  new concerns arise.  Patient Instructions   Your blood sugar is better . Continue  better diet ..no sugar drinks.  Track and try  limiting other sugars simple carbs  .   Get a flu  shot   in October . Consider  adding  lipid statin medication.   I will send in med to your pharmacy .  Plan fasting lab in 4 months  and then follow up.     Neta Mends. Garnie Borchardt M.D.

## 2021-03-03 ENCOUNTER — Ambulatory Visit (INDEPENDENT_AMBULATORY_CARE_PROVIDER_SITE_OTHER): Payer: Medicare Other | Admitting: Internal Medicine

## 2021-03-03 ENCOUNTER — Other Ambulatory Visit: Payer: Self-pay

## 2021-03-03 ENCOUNTER — Encounter: Payer: Self-pay | Admitting: Internal Medicine

## 2021-03-03 VITALS — BP 116/64 | HR 73 | Temp 98.4°F | Ht 65.0 in | Wt 232.2 lb

## 2021-03-03 DIAGNOSIS — G4709 Other insomnia: Secondary | ICD-10-CM

## 2021-03-03 DIAGNOSIS — E1165 Type 2 diabetes mellitus with hyperglycemia: Secondary | ICD-10-CM

## 2021-03-03 DIAGNOSIS — E119 Type 2 diabetes mellitus without complications: Secondary | ICD-10-CM

## 2021-03-03 DIAGNOSIS — Z79899 Other long term (current) drug therapy: Secondary | ICD-10-CM | POA: Diagnosis not present

## 2021-03-03 DIAGNOSIS — I10 Essential (primary) hypertension: Secondary | ICD-10-CM

## 2021-03-03 DIAGNOSIS — G4731 Primary central sleep apnea: Secondary | ICD-10-CM

## 2021-03-03 DIAGNOSIS — E785 Hyperlipidemia, unspecified: Secondary | ICD-10-CM

## 2021-03-03 LAB — POCT GLYCOSYLATED HEMOGLOBIN (HGB A1C): Hemoglobin A1C: 6.5 % — AB (ref 4.0–5.6)

## 2021-03-03 MED ORDER — ROSUVASTATIN CALCIUM 5 MG PO TABS: 5.0000 mg | ORAL_TABLET | Freq: Every day | ORAL | 4 refills | Status: DC

## 2021-03-03 NOTE — Patient Instructions (Signed)
  Your blood sugar is better . Continue  better diet ..no sugar drinks.  Track and try  limiting other sugars simple carbs  .   Get a flu  shot   in October . Consider  adding  lipid statin medication.   I will send in med to your pharmacy .  Plan fasting lab in 4 months  and then follow up.

## 2021-04-21 ENCOUNTER — Other Ambulatory Visit: Payer: Self-pay | Admitting: Internal Medicine

## 2021-05-30 ENCOUNTER — Other Ambulatory Visit: Payer: Self-pay | Admitting: Internal Medicine

## 2021-06-21 ENCOUNTER — Encounter: Payer: Self-pay | Admitting: Internal Medicine

## 2021-06-22 ENCOUNTER — Other Ambulatory Visit: Payer: Self-pay

## 2021-06-22 ENCOUNTER — Other Ambulatory Visit: Payer: Self-pay | Admitting: Internal Medicine

## 2021-06-22 MED ORDER — ZOLPIDEM TARTRATE 10 MG PO TABS: ORAL_TABLET | ORAL | 0 refills | Status: DC

## 2021-06-25 ENCOUNTER — Other Ambulatory Visit: Payer: Self-pay

## 2021-06-25 ENCOUNTER — Encounter: Payer: Self-pay | Admitting: Internal Medicine

## 2021-06-25 ENCOUNTER — Telehealth: Payer: Self-pay | Admitting: Internal Medicine

## 2021-06-25 ENCOUNTER — Other Ambulatory Visit: Payer: Self-pay | Admitting: Internal Medicine

## 2021-06-25 ENCOUNTER — Telehealth (INDEPENDENT_AMBULATORY_CARE_PROVIDER_SITE_OTHER): Payer: Medicare Other | Admitting: Internal Medicine

## 2021-06-25 DIAGNOSIS — G4731 Primary central sleep apnea: Secondary | ICD-10-CM | POA: Diagnosis not present

## 2021-06-25 DIAGNOSIS — B9789 Other viral agents as the cause of diseases classified elsewhere: Secondary | ICD-10-CM

## 2021-06-25 DIAGNOSIS — Z8616 Personal history of COVID-19: Secondary | ICD-10-CM

## 2021-06-25 DIAGNOSIS — Z79899 Other long term (current) drug therapy: Secondary | ICD-10-CM

## 2021-06-25 DIAGNOSIS — J988 Other specified respiratory disorders: Secondary | ICD-10-CM

## 2021-06-25 DIAGNOSIS — G4709 Other insomnia: Secondary | ICD-10-CM

## 2021-06-25 MED ORDER — PROMETHAZINE-DM 6.25-15 MG/5ML PO SYRP: 5.0000 mL | ORAL_SOLUTION | Freq: Four times a day (QID) | ORAL | 0 refills | Status: DC | PRN

## 2021-06-25 NOTE — Telephone Encounter (Signed)
Patient followed up on prescription being denied. I let her know that message had been sent to Dr.Panosh for her to take a look at it. Patient stated that she would wait a little bit and then check back in with pharmacy to see.        Please advise

## 2021-06-25 NOTE — Telephone Encounter (Signed)
Pt called after hours line-- stated she has a prescription that was sent through [facility].  States she placed a request through MyChart and has been taking the medication for several years.  [facility] is now stating that her request was denied.

## 2021-06-25 NOTE — Telephone Encounter (Signed)
Pt called at 8:09 this am requesting medication be refilled to [facility]. Upon review of chart medication was refilled on 06/22/20 but it was printed. Medication pended & sent to PCP for refill.

## 2021-06-25 NOTE — Progress Notes (Signed)
Virtual Visit via Video Note  I connected with Rachel Blair on 06/25/21 at  9:00 AM EST by a video enabled telemedicine application and verified that I am speaking with the correct person using two identifiers. Location patient: home Location provider:home office Persons participating in the virtual visit: patient, provider  [place] national recommendations  regarding COVID 19 pandemic   video visit is advised over in office visit for this patient.  Patient aware  of the limitations of evaluation and management by telemedicine and  availability of in person appointments. and agreed to proceed.   HPI: Rachel Blair presents for video visit because of 2 to 3 days of upper respiratory congestion and then cough recently with some productive and no hemoptysis no fever or myalgias although temp was 99 she did lose her sense of taste and smell and has done 2 negative COVID tests.  Back urts from coughing  Her husband had a similar symptom onset last week still has some symptoms getting better. She had COVID in the spring this year had pack Slo-Bid for couple days but did not tolerate it so stopped she states that was pretty mild and recovered. She is on CPAP took Benadryl at night to help her sleep with the CPAP and it seemed to help but Mucinex did not. There is a prescription Ambien refill pending and some confusion with the pharmacy saying it was denied.  She has a follow-up visit with me at the end of the month.    Not taking tizanidine  ROS: See pertinent positives and negatives per HPI. No local pain sob   Past Medical History:  Diagnosis Date   Allergy    Arthritis    CHEST DISCOMFORT 08/30/2007   Qualifier: Diagnosis of  By: Fabian Sharp MD, Neta Mends    Chest pain    atypical   History of pneumonia    Hypertension    Nasal septal deviation    OBSTRUCTIVE SLEEP APNEA 08/30/2007   Qualifier: Diagnosis of  By: Fabian Sharp MD, Neta Mends    Odontogenic cyst    of left maxillary sinus   OSA  (obstructive sleep apnea)    moderatley severe, uses CPAP nightly   Trigger thumb of left hand     Past Surgical History:  Procedure Laterality Date   biopsy and bone graft of left maxillary sinus odontogenic cyst     CHOLECYSTECTOMY     left thumb trigger finger release Right    TONSILLECTOMY     TRIGGER FINGER RELEASE Left 05/27/2015   Procedure: RELEASE TRIGGER FINGER/A-1 PULLEY LEFT THUMB;  Surgeon: Cindee Salt, MD;  Location: [facility];  Service: Orthopedics;  Laterality: Left;   TUBAL LIGATION      Family History  Problem Relation Age of Onset   Heart disease Mother    Stroke Father    Heart failure Father    Emphysema Father    Rheum arthritis Daughter    Breast cancer Paternal Aunt    Snoring Brother    Lung cancer Brother     Social History   Tobacco Use   Smoking status: Former    Packs/day: 1.00    Years: 30.00    Pack years: 30.00    Types: Cigarettes    Quit date: 06/14/1998    Years since quitting: 23.0   Smokeless tobacco: Never  Vaping Use   Vaping Use: Never used  Substance Use Topics   Alcohol use: Yes    Comment: once  a month   Drug use: No      Current Outpatient Medications:    cycloSPORINE (RESTASIS) 0.05 % ophthalmic emulsion, Place 1 drop into both eyes 2 (two) times daily., Disp: , Rfl:    hydrochlorothiazide (HYDRODIURIL) 25 MG tablet, Take 1 tablet (25 mg total) by mouth daily., Disp: 90 tablet, Rfl: 2   LORazepam (ATIVAN) 0.5 MG tablet, TAKE 1 TO 2 TABLETS BY MOUTH BEFORE FLIGHT AS NEEDED AND AS DIRECTED. DO NOT TAKE WITH AMBIEN, Disp: 10 tablet, Rfl: 0   metFORMIN (GLUCOPHAGE-XR) 500 MG 24 hr tablet, Take 500 mg by mouth daily., Disp: , Rfl:    promethazine-dextromethorphan (PROMETHAZINE-DM) 6.25-15 MG/5ML syrup, Take 5 mLs by mouth 4 (four) times daily as needed for cough., Disp: 118 mL, Rfl: 0   rosuvastatin (CRESTOR) 5 MG tablet, TAKE 1 TABLET(5 MG) BY MOUTH DAILY, Disp: 90 tablet, Rfl: 0   tiZANidine (ZANAFLEX) 2 MG  tablet, TAKE 1 TO 2 TABLETS(2 TO 4 MG) BY MOUTH AT BEDTIME AS NEEDED FOR MUSCLE SPASMS, Disp: 20 tablet, Rfl: 0   zolpidem (AMBIEN) 10 MG tablet, TAKE 1 TABLET(10 MG) BY MOUTH AT BEDTIME AS NEEDED FOR SLEEP, Disp: 90 tablet, Rfl: 0  EXAM: BP Readings from Last 3 Encounters:  03/03/21 116/64  01/13/21 132/60  11/26/20 126/62    VITALS per patient if applicable:  GENERAL: alert, oriented, appears well and in no acute distress has some congestion but looks nontoxic and no respiratory distress no coughing during the visit.  HEENT: atraumatic, conjunttiva clear, no obvious abnormalities on inspection of external nose and ears  NECK: normal movements of the head and neck  LUNGS: on inspection no signs of respiratory distress, breathing rate appears normal, no obvious gross SOB, gasping or wheezing  CV: no obvious cyanosis  MS: moves all visible extremities without noticeable abnormality  PSYCH/NEURO: pleasant and cooperative, no obvious depression or anxiety, speech and thought processing grossly intact   ASSESSMENT AND PLAN:  Discussed the following assessment and plan:    ICD-10-CM   1. Viral respiratory infection  J98.8    B97.89     2. Personal history of COVID-19  Z86.16     3. Medication management  Z79.899     4. Complex sleep apnea syndrome  G47.31     5. Other insomnia  G47.09       Counseled.  Symptomatic treatment expectant management follow-up for alarm findings. She can try Afrin a few nights in a rub before bed since Benadryl was helpful I will send in Promethazine DM Review of record shows that Ambien 90 was sent into pharmacy on January 9 please check with her pharmacy. She could choose to recheck COVID test but not sure there would be any new intervention at this time  Expectant management and discussion of plan and treatment with opportunity to ask questions and all were answered. The patient agreed with the plan and demonstrated an understanding of the  instructions.   Advised to call back or seek an in-person evaluation if worsening  or having  further concerns  in interim. No follow-ups on file.    Rachel Andreas, MD

## 2021-06-25 NOTE — Telephone Encounter (Signed)
Pt is needs a refill on zolpidem (AMBIEN) 10 MG tablet the rx said printed. Please resend to  [facility] #[id number] - [place], [place] - [address] AT [facility] OF [address] Phone:  [phone]  Fax:  [phone]

## 2021-06-26 ENCOUNTER — Telehealth: Payer: Self-pay | Admitting: Internal Medicine

## 2021-06-26 MED ORDER — ZOLPIDEM TARTRATE 10 MG PO TABS: ORAL_TABLET | ORAL | 0 refills | Status: DC

## 2021-06-26 NOTE — Telephone Encounter (Signed)
Spoke with patient to inform Rx was sent to the pharmacy this morning and to call the pharmacy to confirm and if not to give the office a call back.

## 2021-06-26 NOTE — Telephone Encounter (Signed)
Patient is requesting a call back.  She is having a hard time getting her Ambien.  Patient is completely out.  She is wanting to come by and get a prescription to take to the pharmacy.

## 2021-06-29 ENCOUNTER — Other Ambulatory Visit: Payer: Medicare Other

## 2021-06-30 ENCOUNTER — Other Ambulatory Visit (INDEPENDENT_AMBULATORY_CARE_PROVIDER_SITE_OTHER): Payer: Medicare Other

## 2021-06-30 DIAGNOSIS — E785 Hyperlipidemia, unspecified: Secondary | ICD-10-CM | POA: Diagnosis not present

## 2021-06-30 DIAGNOSIS — I10 Essential (primary) hypertension: Secondary | ICD-10-CM | POA: Diagnosis not present

## 2021-06-30 DIAGNOSIS — E1165 Type 2 diabetes mellitus with hyperglycemia: Secondary | ICD-10-CM

## 2021-06-30 DIAGNOSIS — Z79899 Other long term (current) drug therapy: Secondary | ICD-10-CM

## 2021-06-30 LAB — HEPATIC FUNCTION PANEL
ALT: 40 U/L — ABNORMAL HIGH (ref 0–35)
AST: 29 U/L (ref 0–37)
Albumin: 4.3 g/dL (ref 3.5–5.2)
Alkaline Phosphatase: 61 U/L (ref 39–117)
Bilirubin, Direct: 0.1 mg/dL (ref 0.0–0.3)
Total Bilirubin: 1 mg/dL (ref 0.2–1.2)
Total Protein: 7.1 g/dL (ref 6.0–8.3)

## 2021-06-30 LAB — LIPID PANEL
Cholesterol: 119 mg/dL (ref 0–200)
HDL: 43.1 mg/dL (ref >39.00)
LDL Cholesterol: 41 mg/dL (ref 0–99)
NonHDL: 75.51
Total CHOL/HDL Ratio: 3
Triglycerides: 171 mg/dL — ABNORMAL HIGH (ref 0.0–149.0)
VLDL: 34.2 mg/dL (ref 0.0–40.0)

## 2021-06-30 LAB — HEMOGLOBIN A1C: Hgb A1c MFr Bld: 7.4 % — ABNORMAL HIGH (ref 4.6–6.5)

## 2021-07-05 NOTE — Progress Notes (Signed)
Chief Complaint  Patient presents with   Follow-up    HPI: [address] T Sigg 71 y.o. come in for Chronic disease management  See last note for viral respiratory infection  Cough  still a problem and "sinuses is in a mess "   smell bad bends over face hurts    cough  no help with cough medicine.  No fever or shortness of breath.  HLD: Taking rosuvastatin no concern  DM:   Has been off metformin has seen an increase in her blood sugar on MyChart.  Unsure why she stopped it thought maybe she was getting headaches but willing to go back on.  Left back pain .  For a number of months that she describes as electric shock without radiation no midline pain injury sciatica pleuritic, breathing issue.  It makes her uncomfortable to sit for a long time in a certain position and sometimes when she rolls over in bed.  Of note on the left side she has had a knee problem but is declining surgery at this time has seen Raliegh Ip. No midline spine tenderness or injury otherwise.  No fever rash.  ROS: See pertinent positives and negatives per HPI.  Past Medical History:  Diagnosis Date   Allergy    Arthritis    CHEST DISCOMFORT 08/30/2007   Qualifier: Diagnosis of  By: Regis Bill MD, Standley Brooking    Chest pain    atypical   History of pneumonia    Hypertension    Nasal septal deviation    OBSTRUCTIVE SLEEP APNEA 08/30/2007   Qualifier: Diagnosis of  By: Regis Bill MD, Standley Brooking    Odontogenic cyst    of left maxillary sinus   OSA (obstructive sleep apnea)    moderatley severe, uses CPAP nightly   Trigger thumb of left hand     Family History  Problem Relation Age of Onset   Heart disease Mother    Stroke Father    Heart failure Father    Emphysema Father    Rheum arthritis Daughter    Breast cancer Paternal Aunt    Snoring Brother    Lung cancer Brother     Social History   Socioeconomic History   Marital status: Married    Spouse name: Not on file   Number of children: 3   Years of  education: Not on file   Highest education level: Not on file  Occupational History   Occupation: retired  Tobacco Use   Smoking status: Former    Packs/day: 1.00    Years: 30.00    Pack years: 30.00    Types: Cigarettes    Quit date: 06/14/1998    Years since quitting: 23.0   Smokeless tobacco: Never  Vaping Use   Vaping Use: Never used  Substance and Sexual Activity   Alcohol use: Yes    Comment: once a month   Drug use: No   Sexual activity: Yes    Birth control/protection: None  Other Topics Concern   Not on file  Social History Narrative   Married   Regular exercise   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   [place] of 2    Working as guardian Doctor, general practice     Ex smoker  Stopped 2000   G3P3   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          Social Determinants of Radio broadcast assistant  Strain: Low Risk    Difficulty of Paying Living Expenses: Not hard at all  Food Insecurity: No Food Insecurity   Worried About Charity fundraiser in the Last Year: Never true   Ran Out of Food in the Last Year: Never true  Transportation Needs: No Transportation Needs   Lack of Transportation (Medical): No   Lack of Transportation (Non-Medical): No  Physical Activity: Inactive   Days of Exercise per Week: 0 days   Minutes of Exercise per Session: 0 min  Stress: No Stress Concern Present   Feeling of Stress : Not at all  Social Connections: Moderately Integrated   Frequency of Communication with Friends and Family: More than three times a week   Frequency of Social Gatherings with Friends and Family: More than three times a week   Attends Religious Services: More than 4 times per year   Active Member of [employer] or Organizations: No   Attends Archivist Meetings: Never   Marital Status: Married    Outpatient Medications Prior to Visit  Medication Sig Dispense Refill   cycloSPORINE (RESTASIS) 0.05 % ophthalmic emulsion Place 1 drop  into both eyes 2 (two) times daily.     hydrochlorothiazide (HYDRODIURIL) 25 MG tablet Take 1 tablet (25 mg total) by mouth daily. 90 tablet 2   rosuvastatin (CRESTOR) 5 MG tablet TAKE 1 TABLET(5 MG) BY MOUTH DAILY 90 tablet 0   zolpidem (AMBIEN) 10 MG tablet TAKE 1 TABLET(10 MG) BY MOUTH AT BEDTIME AS NEEDED FOR SLEEP 90 tablet 0   LORazepam (ATIVAN) 0.5 MG tablet TAKE 1 TO 2 TABLETS BY MOUTH BEFORE FLIGHT AS NEEDED AND AS DIRECTED. DO NOT TAKE WITH AMBIEN (Patient not taking: Reported on 07/06/2021) 10 tablet 0   metFORMIN (GLUCOPHAGE-XR) 500 MG 24 hr tablet Take 500 mg by mouth daily. (Patient not taking: Reported on 07/06/2021)     promethazine-dextromethorphan (PROMETHAZINE-DM) 6.25-15 MG/5ML syrup Take 5 mLs by mouth 4 (four) times daily as needed for cough. (Patient not taking: Reported on 07/06/2021) 118 mL 0   tiZANidine (ZANAFLEX) 2 MG tablet TAKE 1 TO 2 TABLETS(2 TO 4 MG) BY MOUTH AT BEDTIME AS NEEDED FOR MUSCLE SPASMS (Patient not taking: Reported on 07/06/2021) 20 tablet 0   No facility-administered medications prior to visit.     EXAM:  BP 124/70 (BP Location: Left Arm, Patient Position: Sitting, Cuff Size: Normal)    Pulse 74    Temp 98.8 F (37.1 C) (Oral)    Ht 5\' 5"  (1.651 m)    Wt 230 lb 6.4 oz (104.5 kg)    SpO2 96%    BMI 38.34 kg/m   Body mass index is 38.34 kg/m. Wt Readings from Last 3 Encounters:  07/06/21 230 lb 6.4 oz (104.5 kg)  03/03/21 232 lb 3.2 oz (105.3 kg)  01/13/21 233 lb 12.8 oz (106.1 kg)    GENERAL: vitals reviewed and listed above, alert, oriented, appears well hydrated and in no acute distress moderate congestion no acute distress HEENT: atraumatic, conjunctiva  clear, no obvious abnormalities on inspection of external nose and ears TMs are clear frontal maxillary tender OP : Mast not examined NECK: no obvious masses on inspection palpation  LUNGS: clear to auscultation bilaterally, no wheezes, rales or rhonchi, good air movement No midline back  tender she has significant muscle spasm  left paraspinal lower thorax upper lumbar area but no point tenderness.  No rash noted. CV: HRRR, no clubbing cyanosis or  peripheral edema nl cap  refill  MS: moves all extremities without noticeable focal  abnormality PSYCH: pleasant and cooperative, no obvious depression or anxiety Lab Results  Component Value Date   WBC 6.3 07/23/2020   HGB 15.9 (H) 07/23/2020   HCT 45.5 07/23/2020   PLT 230.0 07/23/2020   GLUCOSE 116 (H) 02/19/2021   CHOL 119 06/30/2021   TRIG 171.0 (H) 06/30/2021   HDL 43.10 06/30/2021   LDLDIRECT 112.0 02/19/2021   LDLCALC 41 06/30/2021   ALT 40 (H) 06/30/2021   AST 29 06/30/2021   NA 139 02/19/2021   K 3.5 02/19/2021   CL 100 02/19/2021   CREATININE 0.80 02/19/2021   BUN 16 02/19/2021   CO2 28 02/19/2021   TSH 1.84 03/23/2019   HGBA1C 7.4 (H) 06/30/2021   BP Readings from Last 3 Encounters:  07/06/21 124/70  03/03/21 116/64  01/13/21 132/60  Reviewed lab work with patient.  ASSESSMENT AND PLAN:  Discussed the following assessment and plan:  Type 2 diabetes mellitus with hyperglycemia, without long-term current use of insulin (HCC) - 6.5-7.4  resume metformin 500 today try to increase it to thousand a day as tolerated let us know about refills needed.  Hyperlipidemia, unspecified hyperlipidemia type - Improved LDL continue rosuvastatin  Medication management  Left paraspinal back pain - Seems musculoskeletal or nerve pain and positional options discussed referral to [facility] for help - Plan: Ambulatory referral to Sports Medicine  Acute sinusitis with symptoms greater than 10 days - Secondary infection from viral URI begin antibiotic Void starting the metformin then the Augmentin on the same day because they both can cause GI symptoms. Refill diflucan if needed also reviewed  can use otc miconazole  for vag yeast  -Patient advised to return or notify health care team  if  new concerns  arise. 4 separate problems ( 2 new) reviewed addressed   r44 minutes Patient Instructions  Treat for sinusitis that should help your cough . And will send in diflucan for possible yeast .   Get back on metformin er once a day  and after 2-3 weeks  try 2x500 mg per day . Can spread out of  take together.   Stay on rosuvastatin   cholesterol is better .   Dietary changes  to help blood sugar . As we discussed .  Let us know if you  need [place] to refer  for dietary counseling .   The back pain seems  like pinched nerve  and MS cause .  Consider  [place] .sports medicine  evaluation.  Plan fu in 3 months about blood sugar   .   Contact in meantime if needed      Standley Brooking. Shirleen Mcfaul M.D.

## 2021-07-05 NOTE — Progress Notes (Signed)
Blood sugar is up  cholesterol ldl better . Will discuss  blood sugar control at upcoming  visit

## 2021-07-06 ENCOUNTER — Ambulatory Visit (INDEPENDENT_AMBULATORY_CARE_PROVIDER_SITE_OTHER): Payer: Medicare Other | Admitting: Internal Medicine

## 2021-07-06 ENCOUNTER — Encounter: Payer: Self-pay | Admitting: Internal Medicine

## 2021-07-06 VITALS — BP 124/70 | HR 74 | Temp 98.8°F | Ht 65.0 in | Wt 230.4 lb

## 2021-07-06 DIAGNOSIS — Z79899 Other long term (current) drug therapy: Secondary | ICD-10-CM | POA: Diagnosis not present

## 2021-07-06 DIAGNOSIS — E1165 Type 2 diabetes mellitus with hyperglycemia: Secondary | ICD-10-CM | POA: Diagnosis not present

## 2021-07-06 DIAGNOSIS — E785 Hyperlipidemia, unspecified: Secondary | ICD-10-CM

## 2021-07-06 DIAGNOSIS — M5489 Other dorsalgia: Secondary | ICD-10-CM | POA: Diagnosis not present

## 2021-07-06 DIAGNOSIS — J019 Acute sinusitis, unspecified: Secondary | ICD-10-CM

## 2021-07-06 MED ORDER — FLUCONAZOLE 150 MG PO TABS: 150.0000 mg | ORAL_TABLET | Freq: Once | ORAL | 1 refills | Status: AC

## 2021-07-06 MED ORDER — AMOXICILLIN-POT CLAVULANATE 875-125 MG PO TABS: 1.0000 | ORAL_TABLET | Freq: Two times a day (BID) | ORAL | 0 refills | Status: DC

## 2021-07-06 NOTE — Patient Instructions (Addendum)
Treat for sinusitis that should help your cough . And will send in diflucan for possible yeast .   Get back on metformin er once a day  and after 2-3 weeks  try 2x500 mg per day . Can spread out of  take together.   Stay on rosuvastatin   cholesterol is better .   Dietary changes  to help blood sugar . As we discussed .  Let us know if you  need [place] to refer  for dietary counseling .   The back pain seems  like pinched nerve  and MS cause .  Consider  [place] .sports medicine  evaluation.  Plan fu in 3 months about blood sugar   .   Contact in meantime if needed

## 2021-07-13 NOTE — Progress Notes (Signed)
Rachel SellsBen Kayln Blair Rachel Blair. CAQSM [facility] [address] [place] [id number] Phone: [phone]   Assessment and Plan:     1. Chronic left-sided thoracic back pain 2. Chronic pain of left knee -Chronic with exacerbation, initial sports medicine visit - Multiple chronic musculoskeletal complaints without red flag symptoms, without acute change.  Most recent imaging from 11/18/2019, and no repeat imaging done today.  Left knee MRI showed complex tearing of medial meniscus.  Lumbar MRI showed multilevel spondylosis greatest at L4-5 - Due to broad nature of patient's complaints and pains, it was agreed to start a course of anti-inflammatories to see if pain can be adequately treated with conservative means - Start meloxicam 15 mg daily x2 weeks.  If still having pain after 2 weeks, complete 3rd-week of meloxicam. May use remaining meloxicam as needed once daily for pain control.  Do not to use additional NSAIDs while taking meloxicam.  May use Tylenol [phone] mg to 3 times a day for breakthrough pain.  -Recommend starting physical activity including stationary bike, elliptical, water aerobics to decrease weightbearing pressure  Pertinent previous records reviewed include MRI left knee 11/18/2019, MRI lumbar spine 11/18/2019   Follow Up: 2 weeks for reevaluation.  If knee pain is still significant, could consider CSI.  If back pain is still significant, could consider facet versus epidural injection   Subjective:   I, Rachel Blair, am serving as a Neurosurgeonscribe for Doctor Richardean SaleBenjamin Merilyn Pagan  Chief Complaint: left back and knee pain   HPI:  07/14/2029 Patient is a 71 year old female complaining of left paraspinal pain. Patient states that she has hx of torn meniscus 5 years ago now in back on left side has a trigger point she can point to and sometimes it feels like an electrical charge going through it notes her gait is off she feels like she is leaning or going to the left doc  states that her back is "gnarly" cant get down legs wont allow her to get up has to lead with right leg to get up stairs left side of her body is hurting no numbness tingling pain just radiates down to the leg  Relevant Historical Information: Hypertension, GERD  Additional pertinent review of systems negative.   Current Outpatient Medications:    amoxicillin-clavulanate (AUGMENTIN) 875-125 MG tablet, Take 1 tablet by mouth every 12 (twelve) hours. For sinusitis, Disp: 14 tablet, Rfl: 0   cycloSPORINE (RESTASIS) 0.05 % ophthalmic emulsion, Place 1 drop into both eyes 2 (two) times daily., Disp: , Rfl:    hydrochlorothiazide (HYDRODIURIL) 25 MG tablet, Take 1 tablet (25 mg total) by mouth daily., Disp: 90 tablet, Rfl: 2   LORazepam (ATIVAN) 0.5 MG tablet, TAKE 1 TO 2 TABLETS BY MOUTH BEFORE FLIGHT AS NEEDED AND AS DIRECTED. DO NOT TAKE WITH AMBIEN, Disp: 10 tablet, Rfl: 0   meloxicam (MOBIC) 15 MG tablet, Take 1 tablet (15 mg total) by mouth daily., Disp: 30 tablet, Rfl: 0   metFORMIN (GLUCOPHAGE-XR) 500 MG 24 hr tablet, Take 500 mg by mouth daily., Disp: , Rfl:    promethazine-dextromethorphan (PROMETHAZINE-DM) 6.25-15 MG/5ML syrup, Take 5 mLs by mouth 4 (four) times daily as needed for cough., Disp: 118 mL, Rfl: 0   rosuvastatin (CRESTOR) 5 MG tablet, TAKE 1 TABLET(5 MG) BY MOUTH DAILY, Disp: 90 tablet, Rfl: 0   tiZANidine (ZANAFLEX) 2 MG tablet, TAKE 1 TO 2 TABLETS(2 TO 4 MG) BY MOUTH AT BEDTIME AS NEEDED FOR MUSCLE SPASMS, Disp: 20  tablet, Rfl: 0   zolpidem (AMBIEN) 10 MG tablet, TAKE 1 TABLET(10 MG) BY MOUTH AT BEDTIME AS NEEDED FOR SLEEP, Disp: 90 tablet, Rfl: 0   Objective:     Vitals:   07/14/21 0901  BP: 140/70  Pulse: 80  SpO2: 97%  Weight: 230 lb (104.3 kg)  Height: 5\' 5"  (1.651 m)      Body mass index is 38.27 kg/m.    Physical Exam:    Gen: Appears well, nad, nontoxic and pleasant Psych: Alert and oriented, appropriate mood and affect Neuro: sensation intact, strength  is 5/5 in upper and lower extremities, muscle tone wnl Skin: no susupicious lesions or rashes  Back - Normal skin, Spine with normal alignment and no deformity.   No tenderness to vertebral process palpation.   Paraspinous muscles are moderately tender in L1-4 and without spasm Straight leg raise negative Trendelenberg negative    Electronically signed by:  Benito Mccreedy D.[name] 9:38 AM 07/14/21

## 2021-07-14 ENCOUNTER — Ambulatory Visit (INDEPENDENT_AMBULATORY_CARE_PROVIDER_SITE_OTHER): Payer: Medicare Other | Admitting: Sports Medicine

## 2021-07-14 ENCOUNTER — Other Ambulatory Visit: Payer: Self-pay

## 2021-07-14 ENCOUNTER — Other Ambulatory Visit: Payer: Self-pay | Admitting: Sports Medicine

## 2021-07-14 VITALS — BP 140/70 | HR 80 | Ht 65.0 in | Wt 230.0 lb

## 2021-07-14 DIAGNOSIS — G8929 Other chronic pain: Secondary | ICD-10-CM | POA: Diagnosis not present

## 2021-07-14 DIAGNOSIS — M25562 Pain in left knee: Secondary | ICD-10-CM | POA: Diagnosis not present

## 2021-07-14 DIAGNOSIS — M546 Pain in thoracic spine: Secondary | ICD-10-CM | POA: Diagnosis not present

## 2021-07-14 MED ORDER — MELOXICAM 15 MG PO TABS: 15.0000 mg | ORAL_TABLET | Freq: Every day | ORAL | 0 refills | Status: DC

## 2021-07-14 NOTE — Patient Instructions (Addendum)
Good to see you  Start meloxicam 15 mg daily x2 weeks.  If still having pain after 2 weeks, complete 3rd-week of meloxicam. May use remaining meloxicam as needed once daily for pain control.  Do not to use additional NSAIDs while taking meloxicam.  May use Tylenol [phone] mg to 3 times a day for breakthrough pain. Recommend physical activity , stationary bike, elliptical, water aerobics Tylenol 1-2 tablets 500 mg 2-3 times a day for pain  2 week follow up

## 2021-07-30 NOTE — Progress Notes (Signed)
Rachel Blair D.[facility] [address] [place] [address] Phone: [phone]   Assessment and Plan:     1. Chronic pain of left knee 2. 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  Tolerated well per note below  Procedure: Knee Joint Injection Side: Left Indication: Left knee osteoarthritis with history of meniscal tear  Risks explained and consent was given verbally. The site was cleaned with alcohol prep. A needle was introduced with an anterio-lateral approach. Injection given using 61mL of 1% lidocaine without epinephrine and 38mL of kenalog 40mg /ml. This was well tolerated and resulted in symptomatic relief.  Needle was removed, hemostasis achieved, and post injection instructions were explained.   Pt was advised to call or return to clinic if these symptoms worsen or fail to improve as anticipated.   3. Lumbar spondylosis with myelopathy 4. Chronic left-sided thoracic back pain 5. DDD (degenerative disc disease), lumbar -Chronic with exacerbation - No change with conservative management including relative rest and NSAID course - Patient elected to trial first OMT.  Tolerated well per note below - Decision today to treat with OMT was based on Physical Exam  After verbal consent patient was treated with HVLA (high velocity low amplitude), ME (muscle energy), FPR (flex positional release), ST (soft tissue), PC/PD (Pelvic Compression/ Pelvic Decompression) techniques in thoracic, lumbar, and pelvic areas. Patient tolerated the procedure well with improvement in symptoms.  Patient educated on potential side effects of soreness and recommended to rest, hydrate, and use Tylenol as needed for pain control.    Pertinent  previous records reviewed include none   Follow Up: 3 weeks for reevaluation of chronic back pain and knee pain.  If knee pain has continued, could consider HA injection versus repeat imaging.  If back pain has continued, could consider epidural   Subjective:   I, [name], am serving as a for Doctor Neurosurgeon  Chief Complaint: chronic left sided thoracic back pain   HPI:  07/14/2029 Patient is a 71 year old female complaining of left paraspinal pain. Patient states that she has hx of torn meniscus 5 years ago now in back on left side has a trigger point she can point to and sometimes it feels like an electrical charge going through it notes her gait is off she feels like she is leaning or going to the left doc states that her back is "gnarly" cant get down legs wont allow her to get up has to lead with right leg to get up stairs left side of her body is hurting no numbness tingling pain just radiates down to the leg  07/31/2021 Patient states that the back is the same, Left knee feels like its going to quit or give out , still getting the electrical charge. When she stands for to ling she gets a numbing/ aching  sensation down the front of her leg from her hip bone up her back and down her leg      Relevant Historical Information: Hypertension, GERD  Additional pertinent review of systems negative.   Current Outpatient Medications:    cycloSPORINE (RESTASIS) 0.05 % ophthalmic emulsion, Place 1 drop into both eyes 2 (two) times daily., Disp: , Rfl:    hydrochlorothiazide (HYDRODIURIL) 25 MG tablet, Take 1  tablet (25 mg total) by mouth daily., Disp: 90 tablet, Rfl: 2   meloxicam (MOBIC) 15 MG tablet, Take 1 tablet (15 mg total) by mouth daily., Disp: 30 tablet, Rfl: 0   metFORMIN (GLUCOPHAGE-XR) 500 MG 24 hr tablet, Take 500 mg by mouth daily., Disp: , Rfl:    rosuvastatin (CRESTOR) 5 MG tablet, TAKE 1 TABLET(5 MG) BY MOUTH DAILY, Disp: 90 tablet, Rfl: 0   tiZANidine  (ZANAFLEX) 2 MG tablet, TAKE 1 TO 2 TABLETS(2 TO 4 MG) BY MOUTH AT BEDTIME AS NEEDED FOR MUSCLE SPASMS, Disp: 20 tablet, Rfl: 0   zolpidem (AMBIEN) 10 MG tablet, TAKE 1 TABLET(10 MG) BY MOUTH AT BEDTIME AS NEEDED FOR SLEEP, Disp: 90 tablet, Rfl: 0   amoxicillin-clavulanate (AUGMENTIN) 875-125 MG tablet, Take 1 tablet by mouth every 12 (twelve) hours. For sinusitis (Patient not taking: Reported on 07/31/2021), Disp: 14 tablet, Rfl: 0   LORazepam (ATIVAN) 0.5 MG tablet, TAKE 1 TO 2 TABLETS BY MOUTH BEFORE FLIGHT AS NEEDED AND AS DIRECTED. DO NOT TAKE WITH AMBIEN (Patient not taking: Reported on 07/31/2021), Disp: 10 tablet, Rfl: 0   promethazine-dextromethorphan (PROMETHAZINE-DM) 6.25-15 MG/5ML syrup, Take 5 mLs by mouth 4 (four) times daily as needed for cough. (Patient not taking: Reported on 07/31/2021), Disp: 118 mL, Rfl: 0   Objective:     Vitals:   07/31/21 0844  BP: 118/78  Pulse: 74  SpO2: 97%  Weight: 232 lb (105.2 kg)  Height: 5\' 5"  (1.651 m)      Body mass index is 38.61 kg/m.    Physical Exam:       OMT Physical Exam:  ASIS Compression Test: Positive left   Thoracic: TTP paraspinal, T8-11 RRSL Lumbar: TTP paraspinal, L1-3 RLSR Pelvis: Left anterior innominate   General:  awake, alert oriented, no acute distress nontoxic Skin: no suspicious lesions or rashes Neuro:sensation intact, no deficits, strength 5/5 with no deficits, no atrophy, normal muscle tone Psych: No signs of anxiety, depression or other mood disorder  Left knee: No swelling No deformity Neg fluid wave, joint milking ROM Flex 100, Ext 10 TTP medial lateral joint line NTTP over the quad tendon, medial fem condyle, lat fem condyle, patella, plica, patella tendon, tibial tuberostiy, fibular head, posterior fossa, pes anserine bursa, gerdy's tubercle,     Neg lachman Neg sag sign Negative varus stress Negative valgus stress Negative [place]  Electronically signed by:  D.[facility] 12:20 PM 07/31/21

## 2021-07-31 ENCOUNTER — Other Ambulatory Visit: Payer: Self-pay

## 2021-07-31 ENCOUNTER — Ambulatory Visit (INDEPENDENT_AMBULATORY_CARE_PROVIDER_SITE_OTHER): Payer: Medicare Other | Admitting: Sports Medicine

## 2021-07-31 VITALS — BP 118/78 | HR 74 | Ht 65.0 in | Wt 232.0 lb

## 2021-07-31 DIAGNOSIS — M25562 Pain in left knee: Secondary | ICD-10-CM

## 2021-07-31 DIAGNOSIS — M4716 Other spondylosis with myelopathy, lumbar region: Secondary | ICD-10-CM | POA: Diagnosis not present

## 2021-07-31 DIAGNOSIS — M9905 Segmental and somatic dysfunction of pelvic region: Secondary | ICD-10-CM

## 2021-07-31 DIAGNOSIS — M9902 Segmental and somatic dysfunction of thoracic region: Secondary | ICD-10-CM | POA: Diagnosis not present

## 2021-07-31 DIAGNOSIS — G8929 Other chronic pain: Secondary | ICD-10-CM

## 2021-07-31 DIAGNOSIS — M546 Pain in thoracic spine: Secondary | ICD-10-CM | POA: Diagnosis not present

## 2021-07-31 DIAGNOSIS — M9903 Segmental and somatic dysfunction of lumbar region: Secondary | ICD-10-CM | POA: Diagnosis not present

## 2021-07-31 DIAGNOSIS — M23207 Derangement of unspecified meniscus due to old tear or injury, left knee: Secondary | ICD-10-CM

## 2021-07-31 DIAGNOSIS — M5136 Other intervertebral disc degeneration, lumbar region: Secondary | ICD-10-CM

## 2021-07-31 NOTE — Patient Instructions (Addendum)
Good to see you  Discontinue daily meloxicam 3 week follow up

## 2021-08-08 ENCOUNTER — Other Ambulatory Visit: Payer: Self-pay | Admitting: Sports Medicine

## 2021-08-13 ENCOUNTER — Ambulatory Visit (INDEPENDENT_AMBULATORY_CARE_PROVIDER_SITE_OTHER): Payer: Medicare Other | Admitting: Adult Health

## 2021-08-13 ENCOUNTER — Other Ambulatory Visit: Payer: Self-pay

## 2021-08-13 ENCOUNTER — Encounter: Payer: Self-pay | Admitting: Adult Health

## 2021-08-13 DIAGNOSIS — E669 Obesity, unspecified: Secondary | ICD-10-CM

## 2021-08-13 DIAGNOSIS — G4731 Primary central sleep apnea: Secondary | ICD-10-CM | POA: Diagnosis not present

## 2021-08-13 NOTE — Patient Instructions (Addendum)
Continue on ASV device at bedtime ?Keep up the good work ?Do not drive if sleepy ?Work on healthy weight loss ?Saline nasal rinses Twice daily   ?Saline nasal gel At bedtime  .  ?May try dream wear nasal mask .  ?Follow-up with Dr. Vassie Loll or Cheikh Bramble NP in 1 year and as needed ?

## 2021-08-13 NOTE — Progress Notes (Signed)
? ?@Patient  ID: Rachel Blair, female    DOB: 1951/03/31, 71 y.o.   MRN: [id number] ? ?Chief Complaint  ?Patient presents with  ? Follow-up  ? ? ?Referring provider: ?Madelin Headings, MD ? ?HPI: ?71 year old followed for severe complex sleep apnea on ASV device at bedtime ? ?TEST/EVENTS :  ?HST 05/2014:  AHI 68/hr ?ASV titration study 07/2014:  Optimal pressure >>  PS 3-15, EPAP 7 ?  ? ?08/13/2021 Follow up: Complex sleep apnea  ?Patient presents for a 1 year follow-up.  Patient has underlying complex sleep apnea she is on a ASV device.  She says she wears her sleep machine every single night cannot sleep without it.  She is feels rested with no significant daytime sleepiness and feels that she benefits from her ASV device.  Download shows excellent compliance with 100% usage.  Daily average usage at 8.5 hours.  Patient is on ASV mode EPAP 7 cm H2O.  With a pressure support from 3 to 10 cm H2O.  AHI is 0.7. ?Uses Ambien daily At bedtime  for insomnia. Works great for her.  ? ? ?Allergies  ?Allergen Reactions  ? Amphetamine-Dextroamphet Er Other (See Comments)  ?  REACTION: Coming out of skin  ? Losartan Other (See Comments)  ?  Chest pain  ? Duloxetine Nausea Only  ?  REACTION: Sleepy and nausea  ? Hydrocodone-Acetaminophen Nausea Only  ?  REACTION: abdominal pain  ? Metformin And Related Other (See Comments)  ?  Headache  ? Morphine Sulfate Other (See Comments)  ?  REACTION: abdominal pain  ? ? ?Immunization History  ?Administered Date(s) Administered  ? Influenza Split 02/21/2012, 03/14/2016  ? Influenza, High Dose Seasonal PF 04/19/2017, 03/14/2018, 02/27/2019  ? Influenza,inj,Quad PF,6+ Mos 02/20/2013, 02/25/2014, 04/08/2015, 05/02/2018  ? Influenza-Unspecified 03/28/2021  ? Moderna Sars-Covid-2 Vaccination 07/13/2019, 08/10/2019, 03/07/2020  ? Pneumococcal Conjugate-13 11/22/2016  ? Pneumococcal Polysaccharide-23 02/20/2013  ? Td 06/09/2010  ? Zoster Recombinat (Shingrix) 02/17/2020  ? Zoster, Live 11/15/2011  ?  Zoster, Unspecified 07/13/2020  ? ? ?Past Medical History:  ?Diagnosis Date  ? Allergy   ? Arthritis   ? CHEST DISCOMFORT 08/30/2007  ? Qualifier: Diagnosis of  By: Fabian Sharp MD, Neta Mends   ? Chest pain   ? atypical  ? History of pneumonia   ? Hypertension   ? Nasal septal deviation   ? OBSTRUCTIVE SLEEP APNEA 08/30/2007  ? Qualifier: Diagnosis of  By: Fabian Sharp MD, Neta Mends   ? 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 OSA (obstructive sleep apnea)   ? moderatley severe, uses CPAP nightly  ? Trigger thumb of left hand   ? ? ?Tobacco History: ?Social History  ? ?Tobacco Use  ?Smoking Status Former  ? Packs/day: 1.00  ? Years: 30.00  ? Pack years: 30.00  ? Types: Cigarettes  ? Quit date: 06/14/1998  ? Years since quitting: 23.1  ?Smokeless Tobacco Never  ? ?Counseling given: Not Answered ? ? ?Outpatient Medications Prior to Visit  ?Medication Sig Dispense Refill  ? cycloSPORINE (RESTASIS) 0.05 % ophthalmic emulsion Place 1 drop into both eyes 2 (two) times daily.    ? hydrochlorothiazide (HYDRODIURIL) 25 MG tablet Take 1 tablet (25 mg total) by mouth daily. 90 tablet 2  ? LORazepam (ATIVAN) 0.5 MG tablet TAKE 1 TO 2 TABLETS BY MOUTH BEFORE FLIGHT AS NEEDED AND AS DIRECTED. DO NOT TAKE WITH AMBIEN 10 tablet 0  ? metFORMIN (GLUCOPHAGE-XR) 500 MG 24 hr tablet Take 500  mg by mouth daily.    ? rosuvastatin (CRESTOR) 5 MG tablet TAKE 1 TABLET(5 MG) BY MOUTH DAILY 90 tablet 0  ? zolpidem (AMBIEN) 10 MG tablet TAKE 1 TABLET(10 MG) BY MOUTH AT BEDTIME AS NEEDED FOR SLEEP 90 tablet 0  ? amoxicillin-clavulanate (AUGMENTIN) 875-125 MG tablet Take 1 tablet by mouth every 12 (twelve) hours. For sinusitis (Patient not taking: Reported on 07/31/2021) 14 tablet 0  ? doxycycline (VIBRAMYCIN) 100 MG capsule Take 100 mg by mouth 2 (two) times daily. (Patient not taking: Reported on 08/13/2021)    ? fluconazole (DIFLUCAN) 150 MG tablet Take 150 mg by mouth once. (Patient not taking: Reported on 08/13/2021)    ? meloxicam (MOBIC) 15 MG tablet Take 1  tablet (15 mg total) by mouth daily. (Patient not taking: Reported on 08/13/2021) 30 tablet 0  ? promethazine-dextromethorphan (PROMETHAZINE-DM) 6.25-15 MG/5ML syrup Take 5 mLs by mouth 4 (four) times daily as needed for cough. (Patient not taking: Reported on 07/31/2021) 118 mL 0  ? tiZANidine (ZANAFLEX) 2 MG tablet TAKE 1 TO 2 TABLETS(2 TO 4 MG) BY MOUTH AT BEDTIME AS NEEDED FOR MUSCLE SPASMS (Patient not taking: Reported on 08/13/2021) 20 tablet 0  ? ?No facility-administered medications prior to visit.  ? ? ? ?Review of Systems:  ? ?Constitutional:   No  weight loss, night sweats,  Fevers, chills, fatigue, or  lassitude. ? ?HEENT:   No headaches,  Difficulty swallowing,  Tooth/dental problems, or  Sore throat,  ?              No sneezing, itching, ear ache, nasal congestion, post nasal drip,  ? ?CV:  No chest pain,  Orthopnea, PND, swelling in lower extremities, anasarca, dizziness, palpitations, syncope.  ? ?GI  No heartburn, indigestion, abdominal pain, nausea, vomiting, diarrhea, change in bowel habits, loss of appetite, bloody stools.  ? ?Resp: No shortness of breath with exertion or at rest.  No excess mucus, no productive cough,  No non-productive cough,  No coughing up of blood.  No change in color of mucus.  No wheezing.  No chest wall deformity ? ?Skin: no rash or lesions. ? ?GU: no dysuria, change in color of urine, no urgency or frequency.  No flank pain, no hematuria  ? ?MS:  No joint pain or swelling.  No decreased range of motion.  No back pain. ? ? ? ?Physical Exam ? ?BP 122/80 (BP Location: Left Arm, Patient Position: Sitting, Cuff Size: Large)   Pulse 80   Temp 98.5 ?F (36.9 ?C) (Oral)   Ht 5\' 4"  (1.626 m)   Wt 227 lb 9.6 oz (103.2 kg)   SpO2 97%   BMI 39.07 kg/m?  ? ?GEN: A/Ox3; pleasant , NAD, well nourished  ?  ?HEENT:  [place]/AT,   NOSE-clear, THROAT-clear, no lesions, no postnasal drip or exudate noted.  ?Class 3 MP airway  ? ?NECK:  Supple w/ fair ROM; no JVD; normal carotid impulses w/o  bruits; no thyromegaly or nodules palpated; no lymphadenopathy.   ? ?RESP  Clear  P & A; w/o, wheezes/ rales/ or rhonchi. no accessory muscle use, no dullness to percussion ? ?CARD:  RRR, no m/r/g, no peripheral edema, pulses intact, no cyanosis or clubbing. ? ?GI:   Soft & nt; nml bowel sounds; no organomegaly or masses detected.  ? ?Musco: Warm bil, no deformities or joint swelling noted.  ? ?Neuro: alert, no focal deficits noted.   ? ?Skin: Warm, no lesions or rashes ? ? ? ?  Lab Results: ? ? ? ?BNP ?No results found for: BNP ? ?ProBNP ?No results found for: PROBNP ? ?Imaging: ?No results found. ? ? ? ?No flowsheet data found. ? ?No results found for: NITRICOXIDE ? ? ? ? ? ?Assessment & Plan:  ? ?Complex sleep apnea syndrome ?Excellent control and compliance on ASV device ? ?Plan  ?Patient Instructions  ?Continue on ASV device at bedtime ?Keep up the good work ?Do not drive if sleepy ?Work on healthy weight loss ?Saline nasal rinses Twice daily   ?Saline nasal gel At bedtime  .  ?May try dream wear nasal mask .  ?Follow-up with Dr. Vassie Loll or Gennifer Potenza NP in 1 year and as needed ?  ? ? ?OBESITY ?Healthy weight loss ? ? ? ? ?Rubye Oaks, NP ?08/13/2021 ? ?

## 2021-08-13 NOTE — Assessment & Plan Note (Signed)
Healthy weight loss 

## 2021-08-13 NOTE — Assessment & Plan Note (Signed)
Excellent control and compliance on ASV device ? ?Plan  ?Patient Instructions  ?Continue on ASV device at bedtime ?Keep up the good work ?Do not drive if sleepy ?Work on healthy weight loss ?Saline nasal rinses Twice daily   ?Saline nasal gel At bedtime  .  ?May try dream wear nasal mask .  ?Follow-up with Dr. Vassie Loll or Amil Moseman NP in 1 year and as needed ?  ? ?

## 2021-08-13 NOTE — Addendum Note (Signed)
Addended by: Arvilla Market on: 08/13/2021 09:28 AM ? ? Modules accepted: Orders ? ?

## 2021-08-19 ENCOUNTER — Encounter: Payer: Self-pay | Admitting: Internal Medicine

## 2021-08-20 MED ORDER — DAPAGLIFLOZIN PROPANEDIOL 5 MG PO TABS: 5.0000 mg | ORAL_TABLET | Freq: Every day | ORAL | 3 refills | Status: DC

## 2021-08-20 NOTE — Telephone Encounter (Signed)
So thanks for trying it will not try again. ?Neck step in addition to your lifestyle changes I would advise we try a group of medications that increases  sugar in the urine. ?Has favorable profile  for BP control some weight loss and can be used for heart and kidney disease which you currently dont have a problem with  ? ?If she agrees please send in [place] 5 mg 30 refills  ?I pended order  ?Still keep fu 3-4 months fu plan  ?

## 2021-08-20 NOTE — Progress Notes (Signed)
? ? Aleen Sells D.Judd Gaudier ?[facility] ?[address] [place] [address] ?Phone: [phone] ?  ?Assessment and Plan:   ?  ?1. Chronic pain of left knee ?2. 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  She is also had a low-grade, lingering headache for the past 3 weeks which started around a similar time and may be related to steroids ?- Due to patient's side effects with previous CSI and past medical history DM type II, if patient wants an additional injection, I would recommend that we proceed with HA injection moving forward ?- Additionally, we discussed that headache would be more likely to be related to CSI and less likely to be related to starting metformin.  Recommended discussing restarting metformin with patient's PCP ?  ?Pertinent previous records reviewed include none ?  ?Follow Up: As needed when patient wants to proceed with HA injection.  Recommended that patient call and inform our clinic that she wants an HA injection at which point we could get prior authorization prior to her returning to clinic ?  ?Subjective:   ?I, Jerene Canny, am serving as a Neurosurgeon for Doctor [employer] ? ?Chief Complaint: left knee pain  ? ?HPI:  ?07/14/2029 ?Patient is a 71 year old female complaining of left paraspinal pain. Patient states that she has hx of torn meniscus 5 years ago now in back on left side has a trigger point she can point to and sometimes it feels like an electrical charge going through it notes her gait is off she feels like she is leaning or going to the left doc states that her back is "gnarly" cant get down legs wont allow her to get up has to lead with right leg to get up stairs left side of her body is hurting no numbness  tingling pain just radiates down to the leg ?  ?07/31/2021 ?Patient states that the back is the same, Left knee feels like its going to quit or give out , still getting the electrical charge. When she stands for to ling she gets a numbing/ aching  sensation down the front of her leg from her hip bone up her back and down her leg  ?  ? 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 ?  ?Relevant Historical Information: Hypertension, GERD ? ?Additional pertinent review of systems negative. ? ? ?Current Outpatient Medications:  ?  cycloSPORINE (RESTASIS) 0.05 % ophthalmic emulsion, Place 1 drop into both eyes 2 (two) times daily., Disp: , Rfl:  ?  dapagliflozin propanediol (FARXIGA) 5 MG TABS tablet, Take 1 tablet (5 mg total) by mouth daily before breakfast., Disp: 30 tablet, Rfl: 3 ?  hydrochlorothiazide (HYDRODIURIL) 25 MG tablet, Take 1 tablet (25 mg total) by mouth daily., Disp: 90 tablet, Rfl: 2 ?  LORazepam (ATIVAN) 0.5 MG tablet, TAKE 1 TO 2 TABLETS BY MOUTH BEFORE FLIGHT AS NEEDED AND AS DIRECTED. DO NOT TAKE WITH AMBIEN, Disp: 10 tablet, Rfl: 0 ?  rosuvastatin (CRESTOR) 5 MG tablet,  TAKE 1 TABLET(5 MG) BY MOUTH DAILY, Disp: 90 tablet, Rfl: 0 ?  zolpidem (AMBIEN) 10 MG tablet, TAKE 1 TABLET(10 MG) BY MOUTH AT BEDTIME AS NEEDED FOR SLEEP, Disp: 90 tablet, Rfl: 0 ?  metFORMIN (GLUCOPHAGE-XR) 500 MG 24 hr tablet, Take 500 mg by mouth daily. (Patient not taking: Reported on 08/21/2021), Disp: , Rfl:   ? ?Objective:   ?  ?Vitals:  ? 08/21/21 0852  ?BP: 118/82  ?Pulse: 64  ?SpO2: 98%  ?Weight: 228 lb (103.4 kg)  ?Height: 5\' 4"  (1.626 m)  ?  ?  ?Body mass index is 39.14 kg/m?.  ?  ?Physical Exam:   ? ?General:  awake, alert oriented, no acute  distress nontoxic ?Skin: no suspicious lesions or rashes ?Neuro:sensation intact, no deficits, strength 5/5 with no deficits, no atrophy, normal muscle tone ?Psych: No signs of anxiety, depression or other mood disorder ?  ?Left knee: ?No swelling ?No deformity ?Neg fluid wave, joint milking ?ROM Flex 100, Ext 10 ?TTP medial lateral joint line ?NTTP over the quad tendon, medial fem condyle, lat fem condyle, patella, plica, patella tendon, tibial tuberostiy, fibular head, posterior fossa, pes anserine bursa, gerdy's tubercle,   ?  ?Neg lachman ?Neg sag sign ?Negative varus stress ?Negative valgus stress ? ? ?Electronically signed by:  ? D.Aleen Sells ?[facility] ?9:41 AM 08/21/21 ?

## 2021-08-21 ENCOUNTER — Other Ambulatory Visit: Payer: Self-pay

## 2021-08-21 ENCOUNTER — Ambulatory Visit (INDEPENDENT_AMBULATORY_CARE_PROVIDER_SITE_OTHER): Payer: Medicare Other | Admitting: Sports Medicine

## 2021-08-21 VITALS — BP 118/82 | HR 64 | Ht 64.0 in | Wt 228.0 lb

## 2021-08-21 DIAGNOSIS — M23207 Derangement of unspecified meniscus due to old tear or injury, left knee: Secondary | ICD-10-CM

## 2021-08-21 DIAGNOSIS — M25562 Pain in left knee: Secondary | ICD-10-CM

## 2021-08-21 DIAGNOSIS — G8929 Other chronic pain: Secondary | ICD-10-CM

## 2021-08-21 NOTE — Patient Instructions (Addendum)
Good to see you  ?Recommend re-starting knee HEP ?Recommend call us to schedule an HA injection and prior authorization  as needed  ?Recommend talking to primary care doctor  about restarting Metformin ?

## 2021-08-24 NOTE — Telephone Encounter (Signed)
So  resending my reply from   last week  gues it didn't get forwarded for response  ? ?thanks for trying  metformin   again do  not have to try again. ?Next  step in addition to your lifestyle changes I would advise we try a group of medications that increases  sugar in the urine. ?Has favorable profile  for BP control some weight loss and can be used for heart and kidney disease which you currently dont have a problem with  ? ?If she agrees please send in [place] 5 mg 30 refills  ?I pended order  ?Still keep fu 3-4 months fu plan  ?

## 2021-08-24 NOTE — Telephone Encounter (Signed)
Spoke with patient and she has agreed to stop taking metformin and start the faxgia on 3/13 and will call the office to let Dr. Fabian Sharp how it's going ?

## 2021-08-28 ENCOUNTER — Other Ambulatory Visit: Payer: Self-pay | Admitting: Internal Medicine

## 2021-09-19 ENCOUNTER — Other Ambulatory Visit: Payer: Self-pay | Admitting: Internal Medicine

## 2021-09-22 NOTE — Telephone Encounter (Signed)
Last office visit 07/06/21 ?Last refill 06/26/21 ?

## 2021-10-04 NOTE — Progress Notes (Signed)
? ?Chief Complaint  ?Patient presents with  ? Follow-up  ? ? ?HPI: ?Rachel Blair 71 y.o. come in for Chronic disease management  ?Dm  :had ha x 2 with metformin    bg       on farxiga  5  no se  bg very high at times   avoiding sugar drinks and sweets   avoidance .  Not sure if glucometer is accurate. Getting 150 then 200 at times high no lows  recent 140?   ? ?HLD  taking med   ?Husband undergooing prostate cancer surgery [facility]  ?ROS: See pertinent positives and negatives per HPI. ? ?Past Medical History:  ?Diagnosis Date  ? Allergy   ? Arthritis   ? CHEST DISCOMFORT 08/30/2007  ? Qualifier: Diagnosis of  By: Regis Bill MD, Standley Brooking   ? Chest pain   ? atypical  ? History of pneumonia   ? Hypertension   ? Nasal septal deviation   ? OBSTRUCTIVE SLEEP APNEA 08/30/2007  ? Qualifier: Diagnosis of  By: Regis Bill MD, Standley Brooking   ? 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 OSA (obstructive sleep apnea)   ? moderatley severe, uses CPAP nightly  ? Trigger thumb of left hand   ? ? ?Family History  ?Problem Relation Age of Onset  ? Heart disease Mother   ? Stroke Father   ? Heart failure Father   ? Emphysema Father   ? Rheum arthritis Daughter   ? Breast cancer Paternal Aunt   ? Snoring Brother   ? Lung cancer Brother   ? ? ?Social History  ? ?Socioeconomic History  ? Marital status: Married  ?  Spouse name: Not on file  ? Number of children: 3  ? Years of education: Not on file  ? Highest education level: Not on file  ?Occupational History  ? Occupation: retired  ?Tobacco Use  ? Smoking status: Former  ?  Packs/day: 1.00  ?  Years: 30.00  ?  Pack years: 30.00  ?  Types: Cigarettes  ?  Quit date: 06/14/1998  ?  Years since quitting: 23.3  ? Smokeless tobacco: Never  ?Vaping Use  ? Vaping Use: Never used  ?Substance and Sexual Activity  ? Alcohol use: Yes  ?  Comment: once a month  ? Drug use: No  ? Sexual activity: Yes  ?  Birth control/protection: None  ?Other Topics Concern  ? Not on file  ?Social History Narrative  ? Married  ?  Regular exercise  ? 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 HH of 2   ? Working as guardian adlitem    ? Ex smoker  Stopped 2000  ? G3P3  ? 2 cats and a dog   ?   ?   ? ?Social Determinants of Health  ? ?Financial Resource Strain: Low Risk   ? Difficulty of Paying Living Expenses: Not hard at all  ?Food Insecurity: No Food Insecurity  ? Worried About Charity fundraiser in the Last Year: Never true  ? Ran Out of Food in the Last Year: Never true  ?Transportation Needs: No Transportation Needs  ? Lack of Transportation (Medical): No  ? Lack of Transportation (Non-Medical): No  ?Physical Activity: Inactive  ? Days of Exercise per Week: 0 days  ? Minutes of Exercise per Session: 0 min  ?Stress: No Stress Concern Present  ?  Feeling of Stress : Not at all  ?Social Connections: Moderately Integrated  ? Frequency of Communication with Friends and Family: More than three times a week  ? Frequency of Social Gatherings with Friends and Family: More than three times a week  ? Attends Religious Services: More than 4 times per year  ? Active Member of Clubs or Organizations: No  ? Attends Archivist Meetings: Never  ? Marital Status: Married  ? ? ?Outpatient Medications Prior to Visit  ?Medication Sig Dispense Refill  ? cycloSPORINE (RESTASIS) 0.05 % ophthalmic emulsion Place 1 drop into both eyes 2 (two) times daily.    ? dapagliflozin propanediol (FARXIGA) 5 MG TABS tablet Take 1 tablet (5 mg total) by mouth daily before breakfast. 30 tablet 3  ? hydrochlorothiazide (HYDRODIURIL) 25 MG tablet Take 1 tablet (25 mg total) by mouth daily. 90 tablet 2  ? LORazepam (ATIVAN) 0.5 MG tablet TAKE 1 TO 2 TABLETS BY MOUTH BEFORE FLIGHT AS NEEDED AND AS DIRECTED. DO NOT TAKE WITH AMBIEN 10 tablet 0  ? rosuvastatin (CRESTOR) 5 MG tablet TAKE 1 TABLET(5 MG) BY MOUTH DAILY 90 tablet 0  ? zolpidem (AMBIEN) 10 MG tablet TAKE 1 TABLET(10 MG) BY MOUTH AT BEDTIME AS NEEDED FOR  SLEEP 30 tablet 0  ? metFORMIN (GLUCOPHAGE-XR) 500 MG 24 hr tablet Take 500 mg by mouth daily. (Patient not taking: Reported on 10/05/2021)    ? ?No facility-administered medications prior to visit.  ? ? ? ?EXAM: ? ?BP 116/72 (BP Location: Left Arm, Patient Position: Sitting, Cuff Size: Normal)   Pulse 68   Temp 98.7 ?F (37.1 ?C) (Oral)   Ht 5\' 4"  (1.626 m)   Wt 225 lb 3.2 oz (102.2 kg)   SpO2 99%   BMI 38.66 kg/m?  ? ?Body mass index is 38.66 kg/m?. ? ?GENERAL: vitals reviewed and listed above, alert, oriented, appears well hydrated and in no acute distress ?HEENT: atraumatic, conjunctiva  clear, no obvious abnormalities on inspection of external nose and earMS: moves all extremities without noticeable focal  abnormality ?PSYCH: pleasant and cooperative, no obvious depression or anxiety ?Lab Results  ?Component Value Date  ? WBC 6.3 07/23/2020  ? HGB 15.9 (H) 07/23/2020  ? HCT 45.5 07/23/2020  ? PLT 230.0 07/23/2020  ? GLUCOSE 116 (H) 02/19/2021  ? CHOL 119 06/30/2021  ? TRIG 171.0 (H) 06/30/2021  ? HDL 43.10 06/30/2021  ? LDLDIRECT 112.0 02/19/2021  ? [place] 41 06/30/2021  ? ALT 40 (H) 06/30/2021  ? AST 29 06/30/2021  ? NA 139 02/19/2021  ? K 3.5 02/19/2021  ? CL 100 02/19/2021  ? CREATININE 0.80 02/19/2021  ? BUN 16 02/19/2021  ? CO2 28 02/19/2021  ? TSH 1.84 03/23/2019  ? HGBA1C 7.4 (H) 06/30/2021  ? ?BP Readings from Last 3 Encounters:  ?10/05/21 116/72  ?08/21/21 118/82  ?08/13/21 122/80  ? ? ?ASSESSMENT AND PLAN: ? ?Discussed the following assessment and plan: ? ?Type 2 diabetes mellitus with hyperglycemia, without long-term current use of insulin ([place]) - farxiga 5 no  se   uncertain validity of home GM  getting new monitor and let us know can inc to 10 mg if needed ? ?Medication management ? ?Medication side effect - metformin reports  HA   on rechallenge  ?Ldl at goal  ? ?-Patient advised to return or notify health care team  if  new concerns arise. ? ?Patient Instructions  ?Get a updated meter    strips covered by your insurance.  ?  Continue  intensify lifestyle interventions.  As planned . ?If BG still elevated  we may increase farxiga to 10 mg per day.  ? ?Give [place] BG information  in a month.  ? ?Goal  fasting  below 120  after eating below 180  best  140 and below  ? ?If all ok then  plan ROV in 4 months  ? ? ? ? ? ? ? ? ? ? ? ?Standley Brooking. Froylan Hobby M.D. ?

## 2021-10-05 ENCOUNTER — Encounter: Payer: Self-pay | Admitting: Internal Medicine

## 2021-10-05 ENCOUNTER — Ambulatory Visit (INDEPENDENT_AMBULATORY_CARE_PROVIDER_SITE_OTHER): Payer: Medicare Other | Admitting: Internal Medicine

## 2021-10-05 VITALS — BP 116/72 | HR 68 | Temp 98.7°F | Ht 64.0 in | Wt 225.2 lb

## 2021-10-05 DIAGNOSIS — T887XXA Unspecified adverse effect of drug or medicament, initial encounter: Secondary | ICD-10-CM

## 2021-10-05 DIAGNOSIS — Z79899 Other long term (current) drug therapy: Secondary | ICD-10-CM | POA: Diagnosis not present

## 2021-10-05 DIAGNOSIS — E1165 Type 2 diabetes mellitus with hyperglycemia: Secondary | ICD-10-CM | POA: Diagnosis not present

## 2021-10-05 NOTE — Telephone Encounter (Signed)
Please send in glucometer  and strips and lancets  for 90 days   to check  1-2 x per day as directed  dx diabetes.

## 2021-10-05 NOTE — Patient Instructions (Addendum)
Get a updated meter   strips covered by your insurance.  ?Continue  intensify lifestyle interventions.  As planned . ?If BG still elevated  we may increase farxiga to 10 mg per day.  ? ?Give [place] BG information  in a month.  ? ?Goal  fasting  below 120  after eating below 180  best  140 and below  ? ?If all ok then  plan ROV in 4 months  ? ? ? ? ? ? ? ? ? ?

## 2021-10-06 ENCOUNTER — Other Ambulatory Visit: Payer: Self-pay

## 2021-10-06 MED ORDER — BLOOD GLUCOSE MONITOR KIT: PACK | 0 refills | Status: DC

## 2021-10-06 NOTE — Telephone Encounter (Signed)
Rx sent to the pharmacy.

## 2021-10-20 ENCOUNTER — Encounter: Payer: Self-pay | Admitting: Internal Medicine

## 2021-10-21 MED ORDER — ZOLPIDEM TARTRATE 10 MG PO TABS: ORAL_TABLET | ORAL | 0 refills | Status: DC

## 2021-10-21 NOTE — Telephone Encounter (Signed)
90 days Sent in electronically . ?Good that her glucose monitor  is more accurate.  ?

## 2021-10-21 NOTE — Telephone Encounter (Signed)
NOTED

## 2021-10-26 ENCOUNTER — Encounter (HOSPITAL_COMMUNITY): Payer: Self-pay | Admitting: Emergency Medicine

## 2021-10-26 ENCOUNTER — Ambulatory Visit (HOSPITAL_COMMUNITY)
Admission: EM | Admit: 2021-10-26 | Discharge: 2021-10-26 | Disposition: A | Discharge status: Home / Self Care | Payer: Medicare Other | Attending: Student | Admitting: Student

## 2021-10-26 ENCOUNTER — Other Ambulatory Visit: Payer: Self-pay

## 2021-10-26 DIAGNOSIS — S39012A Strain of muscle, fascia and tendon of lower back, initial encounter: Secondary | ICD-10-CM

## 2021-10-26 MED ORDER — METHYLPREDNISOLONE SODIUM SUCC 125 MG IJ SOLR
60.0000 mg | Freq: Once | INTRAMUSCULAR | Status: AC
  Administered 2021-10-26: 60 mg via INTRAMUSCULAR

## 2021-10-26 MED ORDER — KETOROLAC TROMETHAMINE 10 MG PO TABS: 10.0000 mg | ORAL_TABLET | Freq: Four times a day (QID) | ORAL | 0 refills | Status: DC | PRN

## 2021-10-26 MED ORDER — METHYLPREDNISOLONE SODIUM SUCC 125 MG IJ SOLR
INTRAMUSCULAR | Status: AC
  Filled 2021-10-26: qty 2

## 2021-10-26 NOTE — ED Provider Notes (Signed)
?[facility] ? ? ? ?CSN: [id number] ?Arrival date & time: 10/26/21  9485 ? ? ?  ? ?History   ?Chief Complaint ?Chief Complaint  ?Patient presents with  ? Back Pain  ? ? ?HPI ?Rachel Blair is a 71 y.o. female presenting with right-sided lower back pain.  Per patient, history of spinal stenosis and bulging disc in the lower back.  States that she was doing some yard work 4 days ago, but this did not involve heavy lifting.  States following this, developed right-sided lower back pain.  Minimal to no pain at rest or with sitting, but with standing or laying flat she has significant right-sided lower back pain with some radiation of the pain down the back of her right leg.  Has attempted Zanaflex, lidocaine patch, Aleve without relief. Not currently followed by a spine specialist. ? ? ?HPI ? ?Past Medical History:  ?Diagnosis Date  ? Allergy   ? Arthritis   ? CHEST DISCOMFORT 08/30/2007  ? Qualifier: Diagnosis of  By: Regis Bill MD, Standley Brooking   ? Chest pain   ? atypical  ? History of pneumonia   ? Hypertension   ? Nasal septal deviation   ? OBSTRUCTIVE SLEEP APNEA 08/30/2007  ? Qualifier: Diagnosis of  By: Regis Bill MD, Standley Brooking   ? 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 OSA (obstructive sleep apnea)   ? moderatley severe, uses CPAP nightly  ? Trigger thumb of left hand   ? ? ?Patient Active Problem List  ? Diagnosis Date Noted  ? Environmental and seasonal allergies 11/16/2017  ? Trigger thumb of left hand 05/21/2015  ? Medication management 08/23/2013  ? Arthritis 07/10/2012  ? Back pain 07/10/2012  ? HYPERLIPIDEMIA 12/22/2009  ? OBESITY 12/22/2009  ? ATTENTION OR CONCENTRATION DEFICIT 12/22/2009  ? HAND PAIN, BILATERAL 12/12/2007  ? Insomnia 12/12/2007  ? Adjustment disorder with depressed mood 10/30/2007  ? G E R D 10/13/2007  ? 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 Essential hypertension 08/30/2007  ? ? ?Past Surgical History:  ?Procedure Laterality Date  ? biopsy and bone graft of left maxillary sinus  odontogenic cyst    ? CHOLECYSTECTOMY    ? left thumb trigger finger release Right   ? TONSILLECTOMY    ? TRIGGER FINGER RELEASE Left 05/27/2015  ? Procedure: RELEASE TRIGGER FINGER/A-1 PULLEY LEFT THUMB;  Surgeon: Daryll Brod, MD;  Location: [facility];  Service: Orthopedics;  Laterality: Left;  ? TUBAL LIGATION    ? ? ?OB History   ? ? Gravida  ?3  ? Para  ?3  ? Term  ?3  ? Preterm  ?   ? AB  ?   ? Living  ?3  ?  ? ? SAB  ?   ? IAB  ?   ? Ectopic  ?   ? Multiple  ?   ? Live Births  ?   ?   ?  ?  ? ? ? ?Home Medications   ? ?Prior to Admission medications   ?Medication Sig Start Date End Date Taking? Authorizing Provider  ?ketorolac (TORADOL) 10 MG tablet Take 1 tablet (10 mg total) by mouth every 6 (six) hours as needed. 10/26/21  Yes Hazel Sams, PA-C  ?blood glucose meter kit and supplies KIT Dispense based on patient and insurance preference. Use up to 1-2 times daily as directed. 10/06/21   Panosh, Standley Brooking, MD  ?cycloSPORINE (RESTASIS) 0.05 % ophthalmic  emulsion Place 1 drop into both eyes 2 (two) times daily.    [provider]  ?dapagliflozin propanediol (FARXIGA) 5 MG TABS tablet Take 1 tablet (5 mg total) by mouth daily before breakfast. 08/20/21   Panosh, Standley Brooking, MD  ?hydrochlorothiazide (HYDRODIURIL) 25 MG tablet Take 1 tablet (25 mg total) by mouth daily. 02/10/21   Panosh, Standley Brooking, MD  ?LORazepam (ATIVAN) 0.5 MG tablet TAKE 1 TO 2 TABLETS BY MOUTH BEFORE FLIGHT AS NEEDED AND AS DIRECTED. DO NOT TAKE WITH AMBIEN 01/20/21   Laurey Morale, MD  ?metFORMIN (GLUCOPHAGE-XR) 500 MG 24 hr tablet Take 500 mg by mouth daily. ?Patient not taking: Reported on 10/05/2021 02/06/21   [provider]  ?rosuvastatin (CRESTOR) 5 MG tablet TAKE 1 TABLET(5 MG) BY MOUTH DAILY 09/01/21   Panosh, Standley Brooking, MD  ?zolpidem (AMBIEN) 10 MG tablet TAKE 1 TABLET(10 MG) BY MOUTH AT BEDTIME AS NEEDED FOR SLEEP 10/21/21   Panosh, Standley Brooking, MD  ? ? ?Family History ?Family History  ?Problem Relation Age of Onset   ? Heart disease Mother   ? Stroke Father   ? Heart failure Father   ? Emphysema Father   ? Rheum arthritis Daughter   ? Breast cancer Paternal Aunt   ? Snoring Brother   ? Lung cancer Brother   ? ? ?Social History ?Social History  ? ?Tobacco Use  ? Smoking status: Former  ?  Packs/day: 1.00  ?  Years: 30.00  ?  Pack years: 30.00  ?  Types: Cigarettes  ?  Quit date: 06/14/1998  ?  Years since quitting: 23.3  ? Smokeless tobacco: Never  ?Vaping Use  ? Vaping Use: Never used  ?Substance Use Topics  ? Alcohol use: Yes  ?  Comment: once a month  ? Drug use: No  ? ? ? ?Allergies   ?Amphetamine-dextroamphet er, Losartan, Duloxetine, Hydrocodone-acetaminophen, Metformin and related, and Morphine sulfate ? ? ?Review of Systems ?Review of Systems  ?Musculoskeletal:  Positive for back pain.  ?All other systems reviewed and are negative. ? ? ?Physical Exam ?Triage Vital Signs ?ED Triage Vitals  ?Enc Vitals Group  ?   BP   ?   Pulse   ?   Resp   ?   Temp   ?   Temp src   ?   SpO2   ?   Weight   ?   Height   ?   Head Circumference   ?   Peak Flow   ?   Pain Score   ?   Pain Loc   ?   Pain Edu?   ?   Excl. in [place]?   ? ?No data found. ? ?Updated Vital Signs ?BP 136/83 (BP Location: Left Arm) Comment (BP Location): large cuff  Pulse 77   Temp 98.8 ?F (37.1 ?C) (Oral)   Resp 20   SpO2 96%  ? ?Visual Acuity ?Right Eye Distance:   ?Left Eye Distance:   ?Bilateral Distance:   ? ?Right Eye Near:   ?Left Eye Near:    ?Bilateral Near:    ? ?Physical Exam ?Vitals reviewed.  ?Constitutional:   ?   General: She is not in acute distress. ?   Appearance: Normal appearance. She is not ill-appearing.  ?HENT:  ?   Head: Normocephalic and atraumatic.  ?Cardiovascular:  ?   Rate and Rhythm: Normal rate and regular rhythm.  ?   Heart sounds: Normal heart sounds.  ?Pulmonary:  ?  Effort: Pulmonary effort is normal.  ?   Breath sounds: Normal breath sounds and air entry.  ?Abdominal:  ?   Tenderness: There is no abdominal tenderness. There is no  right CVA tenderness, left CVA tenderness, guarding or rebound.  ?Musculoskeletal:  ?   Cervical back: Normal range of motion. No swelling, deformity, signs of trauma, rigidity, spasms, tenderness, bony tenderness or crepitus. No pain with movement.  ?   Thoracic back: No swelling, deformity, signs of trauma, spasms, tenderness or bony tenderness. Normal range of motion. No scoliosis.  ?   Lumbar back: Spasms and tenderness present. No swelling, deformity, signs of trauma or bony tenderness. Normal range of motion. Negative right straight leg raise test and negative left straight leg raise test. No scoliosis.  ?   Comments: Right-sided lumbar paraspinous tenderness and hypertonicity. No midline spinous tenderness, deformity, stepoff. Strength and sensation grossly intact. Gait intact. Positive R straight leg raise. ? ?Absolutely no other injury, deformity, tenderness, ecchymosis, abrasion.  ?Neurological:  ?   General: No focal deficit present.  ?   Mental Status: She is alert.  ?   Cranial Nerves: No cranial nerve deficit.  ?Psychiatric:     ?   Mood and Affect: Mood normal.     ?   Behavior: Behavior normal.     ?   Thought Content: Thought content normal.     ?   Judgment: Judgment normal.  ? ? ? ?UC Treatments / Results  ?Labs ?(all labs ordered are listed, but only abnormal results are displayed) ?Labs Reviewed - No data to display ? ?EKG ? ? ?Radiology ?No results found. ? ?Procedures ?Procedures (including critical care time) ? ?Medications Ordered in UC ?Medications  ?methylPREDNISolone sodium succinate (SOLU-MEDROL) 125 mg/2 mL injection 60 mg (60 mg Intramuscular Given 10/26/21 1015)  ? ? ?Initial Impression / Assessment and Plan / UC Course  ?I have reviewed the triage vital signs and the nursing notes. ? ?Pertinent labs & imaging results that were available during my care of the patient were reviewed by me and considered in my medical decision making (see chart for details). ? ?  ? ?This patient is a very  pleasant 71 y.o. year old female presenting with R lumbar strain. No red flag symptoms or midline bony tenderness. Has already failed treatment with zanaflex, lidocaine patch alone. Toradol PO sent. She is al

## 2021-10-26 NOTE — Discharge Instructions (Addendum)
-  Start the muscle relaxer-Zanaflex (tizanidine), up to 3 times daily for muscle spasms and pain.  This can make you drowsy, so take at bedtime or when you do not need to drive or operate machinery. ?-Toradol (Ketorolac), one pill up to every 6 hours for pain. Make sure to take this with food. Avoid other NSAIDs like ibuprofen, alleve, naproxen while taking this medication.  ?-You can also take Tylenol 1000mg  up to 3x daily ?-Follow-up with your orthopedist if symptoms worsen/persist  ?

## 2021-10-26 NOTE — ED Triage Notes (Signed)
Right mid back pain that started Friday.  Described as "excruciating" pain at times.  Patient has salonpas patch on area currently.  Patient says it does not feel like a pulled muscle, but doesn't know what it is.  Patient has taken tizanidine, but that makes her drowsy.  Patient cannot lie flat on her back, too painful.  Patient can only lie on right side.  Patient did help with removal of a tree on Friday, but no discomfort during process.  Patient reports certain positions of movement do cause pain.   ?

## 2021-10-27 ENCOUNTER — Other Ambulatory Visit: Payer: Self-pay | Admitting: Internal Medicine

## 2021-10-29 ENCOUNTER — Ambulatory Visit (HOSPITAL_BASED_OUTPATIENT_CLINIC_OR_DEPARTMENT_OTHER)
Admission: RE | Admit: 2021-10-29 | Discharge: 2021-10-29 | Disposition: A | Discharge status: Home / Self Care | Payer: Medicare Other | Source: Ambulatory Visit | Attending: Family Medicine | Admitting: Family Medicine

## 2021-10-29 ENCOUNTER — Encounter: Payer: Self-pay | Admitting: Family Medicine

## 2021-10-29 ENCOUNTER — Ambulatory Visit (INDEPENDENT_AMBULATORY_CARE_PROVIDER_SITE_OTHER): Payer: Medicare Other | Admitting: Family Medicine

## 2021-10-29 VITALS — BP 112/78 | HR 88 | Temp 99.0°F | Wt 223.6 lb

## 2021-10-29 DIAGNOSIS — R1011 Right upper quadrant pain: Secondary | ICD-10-CM | POA: Diagnosis not present

## 2021-10-29 DIAGNOSIS — R109 Unspecified abdominal pain: Secondary | ICD-10-CM

## 2021-10-29 DIAGNOSIS — E1165 Type 2 diabetes mellitus with hyperglycemia: Secondary | ICD-10-CM

## 2021-10-29 DIAGNOSIS — M545 Low back pain, unspecified: Secondary | ICD-10-CM

## 2021-10-29 LAB — COMPREHENSIVE METABOLIC PANEL
ALT: 24 U/L (ref 0–35)
AST: 15 U/L (ref 0–37)
Albumin: 4.3 g/dL (ref 3.5–5.2)
Alkaline Phosphatase: 54 U/L (ref 39–117)
BUN: 17 mg/dL (ref 6–23)
CO2: 30 mEq/L (ref 19–32)
Calcium: 9.3 mg/dL (ref 8.4–10.5)
Chloride: 101 mEq/L (ref 96–112)
Creatinine, Ser: 0.82 mg/dL (ref 0.40–1.20)
GFR: 72.47 mL/min (ref >60.00)
Glucose, Bld: 119 mg/dL — ABNORMAL HIGH (ref 70–99)
Potassium: 3.5 mEq/L (ref 3.5–5.1)
Sodium: 140 mEq/L (ref 135–145)
Total Bilirubin: 0.8 mg/dL (ref 0.2–1.2)
Total Protein: 6.6 g/dL (ref 6.0–8.3)

## 2021-10-29 LAB — POCT URINALYSIS DIPSTICK
Bilirubin, UA: NEGATIVE
Blood, UA: NEGATIVE
Glucose, UA: POSITIVE — AB
Ketones, UA: NEGATIVE
Leukocytes, UA: NEGATIVE
Nitrite, UA: NEGATIVE
Protein, UA: NEGATIVE
Spec Grav, UA: 1.015 (ref 1.010–1.025)
Urobilinogen, UA: NEGATIVE E.U./dL — AB
pH, UA: 7 (ref 5.0–8.0)

## 2021-10-29 LAB — CBC WITH DIFFERENTIAL/PLATELET
Basophils Absolute: 0 10*3/uL (ref 0.0–0.1)
Basophils Relative: 0.5 % (ref 0.0–3.0)
Eosinophils Absolute: 0.2 10*3/uL (ref 0.0–0.7)
Eosinophils Relative: 2 % (ref 0.0–5.0)
HCT: 45.3 % (ref 36.0–46.0)
Hemoglobin: 15.5 g/dL — ABNORMAL HIGH (ref 12.0–15.0)
Lymphocytes Relative: 35.3 % (ref 12.0–46.0)
Lymphs Abs: 2.8 10*3/uL (ref 0.7–4.0)
MCHC: 34.3 g/dL (ref 30.0–36.0)
MCV: 90 fl (ref 78.0–100.0)
Monocytes Absolute: 0.6 10*3/uL (ref 0.1–1.0)
Monocytes Relative: 7.7 % (ref 3.0–12.0)
Neutro Abs: 4.3 10*3/uL (ref 1.4–7.7)
Neutrophils Relative %: 54.5 % (ref 43.0–77.0)
Platelets: 227 10*3/uL (ref 150.0–400.0)
RBC: 5.03 Mil/uL (ref 3.87–5.11)
RDW: 13.7 % (ref 11.5–15.5)
WBC: 8 10*3/uL (ref 4.0–10.5)

## 2021-10-29 LAB — POCT GLYCOSYLATED HEMOGLOBIN (HGB A1C): Hemoglobin A1C: 6 % — AB (ref 4.0–5.6)

## 2021-10-29 LAB — LIPASE: Lipase: 9 U/L — ABNORMAL LOW (ref 11.0–59.0)

## 2021-10-29 NOTE — Progress Notes (Signed)
Subjective:    Patient ID: Rachel Blair, female    DOB: 1950/08/21, 71 y.o.   MRN: [id number]  Chief Complaint  Patient presents with   Back Pain    UC felt it was muscular. Was given prednisone shot, still hurting. Now has a tingling in the right side. Has hx of back problems, but does not feel that is what it is, feels more internal as it is tingling in her right side from back to front. Was given toradol, but did not take it as she read about the side effects it has.    HPI Patient was seen today for acute concern.  Patient endorses right lower back pain starting Saturday, 5 days ago.  At that time pain was described as dull but intense.  Patient was unable to lay flat.  Relief if laid on right side.  Went to UC on Monday.  Told msk given prednisone and Toradol.  Patient did not take the Toradol 2/2 possible side effects.  Patient notes since UC visit pain has moved and now with tingling in right side that comes around to front.  Patient denies fever, chills, nausea, vomiting.  Tried Tylenol and Salonpas.  States sensation feels a little better but feels more of an internal discomfort.  Pt taking Farxiga for DM.  Notes having to get a new glucometer as her meter from [employer] read high (100-600).  Average BS with new meter 150 which is still slightly higher than patient's normal.  Past Medical History:  Diagnosis Date   Allergy    Arthritis    CHEST DISCOMFORT 08/30/2007   Qualifier: Diagnosis of  By: Fabian Sharp MD, Neta Mends    Chest pain    atypical   History of pneumonia    Hypertension    Nasal septal deviation    OBSTRUCTIVE SLEEP APNEA 08/30/2007   Qualifier: Diagnosis of  By: Fabian Sharp MD, Neta Mends    Odontogenic cyst    of left maxillary sinus   OSA (obstructive sleep apnea)    moderatley severe, uses CPAP nightly   Trigger thumb of left hand     Allergies  Allergen Reactions   Amphetamine-Dextroamphet Er Other (See Comments)    REACTION: Coming out of skin   Losartan Other (See  Comments)    Chest pain   Duloxetine Nausea Only    REACTION: Sleepy and nausea   Hydrocodone-Acetaminophen Nausea Only    REACTION: abdominal pain   Metformin And Related Other (See Comments)    Headache   Morphine Sulfate Other (See Comments)    REACTION: abdominal pain    ROS General: Denies fever, chills, night sweats, changes in weight, changes in appetite HEENT: Denies headaches, ear pain, changes in vision, rhinorrhea, sore throat CV: Denies CP, palpitations, SOB, orthopnea Pulm: Denies SOB, cough, wheezing GI: Denies abdominal pain, nausea, vomiting, diarrhea, constipation GU: Denies dysuria, hematuria, frequency, vaginal discharge Msk: Denies muscle cramps, joint pains Neuro: Denies weakness, numbness, tingling +tingling in R flank and abdomen Skin: Denies rashes, bruising Psych: Denies depression, anxiety, hallucinations    Objective:    Blood pressure 112/78, pulse 88, temperature 99 F (37.2 C), temperature source Oral, weight 223 lb 9.6 oz (101.4 kg), SpO2 99 %.   Gen. Pleasant, well-nourished, in no distress, normal affect   HEENT: [place]/AT, face symmetric, conjunctiva clear, no scleral icterus, PERRLA, EOMI, nares patent without drainage, pharynx without erythema or exudate. Neck: No JVD, no thyromegaly, no carotid bruits Lungs: no accessory muscle use, CTAB,  no wheezes or rales Cardiovascular: RRR, no m/r/g, no peripheral edema Abdomen: BS present, soft, mild TTP of RUQ and midepigatric area, no hepatosplenomegaly. Musculoskeletal: No deformities, no cyanosis or clubbing, normal tone Neuro:  A&Ox3, CN II-XII intact, normal gait Skin:  Warm, no lesions/ rash.   Wt Readings from Last 3 Encounters:  10/29/21 223 lb 9.6 oz (101.4 kg)  10/05/21 225 lb 3.2 oz (102.2 kg)  08/21/21 228 lb (103.4 kg)    Lab Results  Component Value Date   WBC 6.3 07/23/2020   HGB 15.9 (H) 07/23/2020   HCT 45.5 07/23/2020   PLT 230.0 07/23/2020   GLUCOSE 116 (H) 02/19/2021    CHOL 119 06/30/2021   TRIG 171.0 (H) 06/30/2021   HDL 43.10 06/30/2021   LDLDIRECT 112.0 02/19/2021   LDLCALC 41 06/30/2021   ALT 40 (H) 06/30/2021   AST 29 06/30/2021   NA 139 02/19/2021   K 3.5 02/19/2021   CL 100 02/19/2021   CREATININE 0.80 02/19/2021   BUN 16 02/19/2021   CO2 28 02/19/2021   TSH 1.84 03/23/2019   HGBA1C 7.4 (H) 06/30/2021    Assessment/Plan:  Low back pain, unspecified back pain laterality, unspecified chronicity, unspecified whether sciatica present  -Discussed possible causes including UTI, pyelonephritis, renal calculi, muscle strength, pancreatitis, shingles -UA with 3+ glucose likely 2/2 Farxiga -Hemoglobin A1c this visit 6.0% -Patient advised to increase p.o. intake of water and fluids, supportive care including ice, heat, stretching, massage, Tylenol or NSAIDs as needed -Discussed r/b/a of Toradol. -Further recommendations based on testing results -Given strict precautions - Plan: POCT urinalysis dipstick, CBC with Differential/Platelet, Lipase, CT RENAL STONE STUDY  Type 2 diabetes mellitus with hyperglycemia, without long-term current use of insulin (HCC) -Hemoglobin A1c 6.0% this visit -Continue Farxiga and metformin XR 500 mg daily -Continue lifestyle modifications  - Plan: POCT glycosylated hemoglobin (Hb A1C)  Flank pain -Concern for renal calculi. -We will obtain labs and imaging -Given precautions  - Plan: CBC with Differential/Platelet, CMP, CT RENAL STONE STUDY  Right upper quadrant abdominal pain -Mild RUQ and mid epigastric discomfort on exam.  Concern for pancreatitis versus referred pain -We will obtain labs to assess LFTs and glucose -Hemoglobin A1c 6.0% this visit - Plan: CBC with Differential/Platelet, CMP, Lipase, CT RENAL STONE STUDY   F/u as needed.  Patient given strict precautions for any continued or worsening symptoms.  Abbe Amsterdam, MD

## 2021-11-21 ENCOUNTER — Other Ambulatory Visit: Payer: Self-pay | Admitting: Internal Medicine

## 2021-11-27 ENCOUNTER — Other Ambulatory Visit: Payer: Self-pay | Admitting: Internal Medicine

## 2021-12-01 ENCOUNTER — Ambulatory Visit (INDEPENDENT_AMBULATORY_CARE_PROVIDER_SITE_OTHER): Payer: Medicare Other

## 2021-12-01 VITALS — Ht 64.0 in | Wt 230.0 lb

## 2021-12-01 DIAGNOSIS — Z0000 Encounter for general adult medical examination without abnormal findings: Secondary | ICD-10-CM

## 2021-12-01 NOTE — Progress Notes (Cosign Needed)
I connected with Rachel Blair today by telephone and verified that I am speaking with the correct person using two identifiers. Location patient: home Location provider: work Persons participating in the virtual visit: Odean Kyanna, Mahrt LPN.   I discussed the limitations, risks, security and privacy concerns of performing an evaluation and management service by telephone and the availability of in person appointments. I also discussed with the patient that there may be a patient responsible charge related to this service. The patient expressed understanding and verbally consented to this telephonic visit.    Interactive audio and video telecommunications were attempted between this provider and patient, however failed, due to patient having technical difficulties OR patient did not have access to video capability.  We continued and completed visit with audio only.     Vital signs may be patient reported or missing.  Subjective:   Rachel Blair is a 71 y.o. female who presents for Medicare Annual (Subsequent) preventive examination.  Review of Systems     Cardiac Risk Factors include: advanced age (>34men, >59 women);dyslipidemia;hypertension;obesity (BMI >30kg/m2)     Objective:    Today's Vitals   12/01/21 1158  Weight: 230 lb (104.3 kg)  Height: $Remove'5\' 4"'[id number]$  (1.626 m)   Body mass index is 39.48 kg/m.     12/01/2021   12:05 PM 11/25/2020   10:25 AM 05/27/2015    9:01 AM 05/23/2015   10:46 AM 07/18/2014    8:13 PM  Advanced Directives  Does Patient Have a Medical Advance Directive? No No No No No  Would patient like information on creating a medical advance directive?  Yes (MAU/Ambulatory/Procedural Areas - Information given) No - patient declined information  No - patient declined information    Current Medications (verified) Outpatient Encounter Medications as of 12/01/2021  Medication Sig   ACCU-CHEK GUIDE test strip TEST BLOOD SUGAR 1-2 TIMES PER DAY   Accu-Chek Softclix  Lancets lancets 2 each by Other route daily.   blood glucose meter kit and supplies KIT Dispense based on patient and insurance preference. Use up to 1-2 times daily as directed.   cycloSPORINE (RESTASIS) 0.05 % ophthalmic emulsion Place 1 drop into both eyes 2 (two) times daily.   dapagliflozin propanediol (FARXIGA) 5 MG TABS tablet Take 1 tablet (5 mg total) by mouth daily before breakfast.   hydrochlorothiazide (HYDRODIURIL) 25 MG tablet Take 1 tablet (25 mg total) by mouth daily.   ketorolac (TORADOL) 10 MG tablet Take 1 tablet (10 mg total) by mouth every 6 (six) hours as needed.   LORazepam (ATIVAN) 0.5 MG tablet TAKE 1 TO 2 TABLETS BY MOUTH BEFORE FLIGHT AS NEEDED AND AS DIRECTED. DO NOT TAKE WITH AMBIEN   rosuvastatin (CRESTOR) 5 MG tablet TAKE 1 TABLET(5 MG) BY MOUTH DAILY   zolpidem (AMBIEN) 10 MG tablet TAKE 1 TABLET(10 MG) BY MOUTH AT BEDTIME AS NEEDED FOR SLEEP   metFORMIN (GLUCOPHAGE-XR) 500 MG 24 hr tablet Take 500 mg by mouth daily. (Patient not taking: Reported on 10/05/2021)   No facility-administered encounter medications on file as of 12/01/2021.    Allergies (verified) Amphetamine-dextroamphet er, Losartan, Duloxetine, Hydrocodone-acetaminophen, Metformin and related, and Morphine sulfate   History: Past Medical History:  Diagnosis Date   Allergy    Arthritis    CHEST DISCOMFORT 08/30/2007   Qualifier: Diagnosis of  By: Regis Bill MD, Standley Brooking    Chest pain    atypical   History of pneumonia    Hypertension    Nasal septal deviation  OBSTRUCTIVE SLEEP APNEA 08/30/2007   Qualifier: Diagnosis of  By: Regis Bill MD, Standley Brooking    Odontogenic cyst    of left maxillary sinus   OSA (obstructive sleep apnea)    moderatley severe, uses CPAP nightly   Trigger thumb of left hand    Past Surgical History:  Procedure Laterality Date   biopsy and bone graft of left maxillary sinus odontogenic cyst     CHOLECYSTECTOMY     left thumb trigger finger release Right    TONSILLECTOMY      TRIGGER FINGER RELEASE Left 05/27/2015   Procedure: RELEASE TRIGGER FINGER/A-1 PULLEY LEFT THUMB;  Surgeon: Daryll Brod, MD;  Location: [facility];  Service: Orthopedics;  Laterality: Left;   TUBAL LIGATION     Family History  Problem Relation Age of Onset   Heart disease Mother    Stroke Father    Heart failure Father    Emphysema Father    Rheum arthritis Daughter    Breast cancer Paternal Aunt    Snoring Brother    Lung cancer Brother    Social History   Socioeconomic History   Marital status: Married    Spouse name: Not on file   Number of children: 3   Years of education: Not on file   Highest education level: Not on file  Occupational History   Occupation: retired  Tobacco Use   Smoking status: Former    Packs/day: 1.00    Years: 30.00    Total pack years: 30.00    Types: Cigarettes    Quit date: 06/14/1998    Years since quitting: 23.4   Smokeless tobacco: Never  Vaping Use   Vaping Use: Never used  Substance and Sexual Activity   Alcohol use: Yes    Comment: once a month   Drug use: No   Sexual activity: Yes    Birth control/protection: None  Other Topics Concern   Not on file  Social History Narrative   Married   Regular exercise   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  Work 2 day week [employer]   [employer]    Working as guardian Doctor, general practice     Ex smoker  [place]   2 cats and a dog          Social Determinants of Radio broadcast assistant Strain: Low Risk  (12/01/2021)   Overall Financial Resource Strain (CARDIA)    Difficulty of Paying Living Expenses: Not hard at all  Food Insecurity: No Food Insecurity (12/01/2021)   Hunger Vital Sign    Worried About Running Out of Food in the Last Year: Never true    [place] in the Last Year: Never true  Transportation Needs: No Transportation Needs (12/01/2021)   PRAPARE - Hydrologist (Medical): No    Lack of  Transportation (Non-Medical): No  Physical Activity: Inactive (12/01/2021)   Exercise Vital Sign    Days of Exercise per Week: 0 days    Minutes of Exercise per Session: 0 min  Stress: Stress Concern Present (12/01/2021)   [place]    Feeling of Stress : To some extent  Social Connections: Moderately Integrated (11/25/2020)   Social Connection and Isolation Panel [NHANES]    Frequency of Communication with Friends and Family: More than three times a week    Frequency of  Social Gatherings with Friends and Family: More than three times a week    Attends Religious Services: More than 4 times per year    Active Member of [employer] or Organizations: No    Attends Music therapist: Never    Marital Status: Married    Tobacco Counseling Counseling given: Not Answered   Clinical Intake:  Pre-visit preparation completed: Yes  Pain : No/denies pain     Nutritional Status: BMI > 30  Obese Nutritional Risks: None Diabetes: No  How often do you need to have someone help you when you read instructions, pamphlets, or other written materials from your doctor or pharmacy?: 1 - Never What is the last grade level you completed in school?: 12th grade  Diabetic? no  Interpreter Needed?: No  Information entered by :: NAllen LPN   Activities of Daily Living    12/01/2021   12:07 PM  In your present state of health, do you have any difficulty performing the following activities:  Hearing? 0  Vision? 0  Difficulty concentrating or making decisions? 0  Walking or climbing stairs? 0  Dressing or bathing? 0  Doing errands, shopping? 0  Preparing Food and eating ? N  Using the Toilet? N  In the past six months, have you accidently leaked urine? N  Do you have problems with loss of bowel control? N  Managing your Medications? N  Managing your Finances? N  Housekeeping or managing your Housekeeping? N    Patient  Care Team: Panosh, Standley Brooking, MD as PCP - General Rigoberto Noel, MD as Consulting Physician (Pulmonary Disease)  Indicate any recent Medical Services you may have received from other than [facility] providers in the past year (date may be approximate).     Assessment:   This is a routine wellness examination for Rachel Blair.  Hearing/Vision screen Vision Screening - Comments:: Regular eye exams, Dr. Talbert Forest  Dietary issues and exercise activities discussed: Current Exercise Habits: The patient does not participate in regular exercise at present   Goals Addressed             This Visit's Progress    Patient Stated       12/01/2021, wants to lose weight and get blood sugar under control       Depression Screen    12/01/2021   12:06 PM 10/29/2021    9:10 AM 10/05/2021    8:54 AM 07/06/2021   10:06 AM 11/25/2020   10:23 AM 12/03/2019   10:19 AM 03/06/2019   10:01 AM  PHQ 2/9 Scores  PHQ - 2 Score 0 0 0 0 0 0 0  PHQ- 9 Score  0 3 2  0     Fall Risk    12/01/2021   12:06 PM 10/29/2021    9:10 AM 10/05/2021    8:54 AM 07/06/2021   10:06 AM 11/25/2020   10:26 AM  Fall Risk   Falls in the past year? 0 0 0 0 0  Number falls in past yr: 0 0 0  0  Injury with Fall? 0 0 0  0  Risk for fall due to : Medication side effect No Fall Risks No Fall Risks  Impaired vision  Follow up Falls evaluation completed;Education provided;Falls prevention discussed Falls evaluation completed Falls evaluation completed  Falls prevention discussed    FALL RISK PREVENTION PERTAINING TO THE HOME:  Any stairs in or around the home? Yes  If so, are there any without handrails? No  Home free of loose throw rugs in walkways, pet beds, electrical cords, etc? Yes  Adequate lighting in your home to reduce risk of falls? Yes   ASSISTIVE DEVICES UTILIZED TO PREVENT FALLS:  Life alert? No  Use of a cane, walker or w/c? No  Grab bars in the bathroom? No  Shower chair or bench in shower? No  Elevated toilet seat or a  handicapped toilet? No   TIMED UP AND GO:  Was the test performed? Yes .      Cognitive Function:        12/01/2021   12:08 PM 11/25/2020   10:28 AM  6CIT Screen  What Year? 0 points 0 points  What month? 0 points 0 points  What time? 0 points 0 points  Count back from 20 0 points 0 points  Months in reverse 0 points 0 points  Repeat phrase 2 points 0 points  Total Score 2 points 0 points    Immunizations Immunization History  Administered Date(s) Administered   Influenza Split 02/21/2012, 03/14/2016   Influenza, High Dose Seasonal PF 04/19/2017, 03/14/2018, 02/27/2019   Influenza,inj,Quad PF,6+ Mos 02/20/2013, 02/25/2014, 04/08/2015, 05/02/2018   Influenza-Unspecified 03/28/2021   Moderna Sars-Covid-2 Vaccination 07/13/2019, 08/10/2019, 03/07/2020   Pneumococcal Conjugate-13 11/22/2016   Pneumococcal Polysaccharide-23 02/20/2013   Td 06/09/2010   Zoster Recombinat (Shingrix) 02/17/2020   Zoster, Live 11/15/2011   Zoster, Unspecified 07/13/2020    TDAP status: Due, Education has been provided regarding the importance of this vaccine. Advised may receive this vaccine at local pharmacy or Health Dept. Aware to provide a copy of the vaccination record if obtained from local pharmacy or Health Dept. Verbalized acceptance and understanding.  Flu Vaccine status: Up to date  Pneumococcal vaccine status: Up to date  Covid-19 vaccine status: Completed vaccines  Qualifies for Shingles Vaccine? Yes   Zostavax completed Yes   Shingrix Completed?: Yes  Screening Tests Health Maintenance  Topic Date Due   Pneumonia Vaccine 69+ Years old (3 - PPSV23 if available, else PCV20) 01/03/2022 (Originally 02/20/2018)   COLONOSCOPY (Pts 45-24yrs Insurance coverage will need to be confirmed)  01/03/2022 (Originally 02/23/2021)   COVID-19 Vaccine (4 - Booster for Moderna series) 01/03/2022 (Originally 05/02/2020)   TETANUS/TDAP  04/06/2022 (Originally 06/09/2020)   INFLUENZA VACCINE   01/12/2022   MAMMOGRAM  11/25/2022   DEXA SCAN  Completed   Hepatitis C Screening  Completed   Zoster Vaccines- Shingrix  Completed   HPV VACCINES  Aged Out    Health Maintenance  There are no preventive care reminders to display for this patient.  Colorectal cancer screening: Type of screening: Cologuard. Completed 2021. Repeat every 3 years  Mammogram status: patient to schedule  Bone Density status: Completed 12/20/2016.   Lung Cancer Screening: (Low Dose CT Chest recommended if Age 61-80 years, 30 pack-year currently smoking OR have quit w/in 15years.) does not qualify.   Lung Cancer Screening Referral: no  Additional Screening:  Hepatitis C Screening: does qualify; Completed 11/22/2016  Vision Screening: Recommended annual ophthalmology exams for early detection of glaucoma and other disorders of the eye. Is the patient up to date with their annual eye exam?  Yes  Who is the provider or what is the name of the office in which the patient attends annual eye exams? Dr. Talbert Forest If pt is not established with a provider, would they like to be referred to a provider to establish care? No .   Dental Screening: Recommended annual dental exams for proper oral  hygiene  Community Resource Referral / Chronic Care Management: CRR required this visit?  No   CCM required this visit?  No      Plan:     I have personally reviewed and noted the following in the patient's chart:   Medical and social history Use of alcohol, tobacco or illicit drugs  Current medications and supplements including opioid prescriptions.  Functional ability and status Nutritional status Physical activity Advanced directives List of other physicians Hospitalizations, surgeries, and ER visits in previous 12 months Vitals Screenings to include cognitive, depression, and falls Referrals and appointments  In addition, I have reviewed and discussed with patient certain preventive protocols, quality  metrics, and best practice recommendations. A written personalized care plan for preventive services as well as general preventive health recommendations were provided to patient.     Kellie Simmering, LPN   [id number]   Nurse Notes: complaints of panic attacks. Appointment made with Dr. Regis Bill.  Due to this being a virtual visit, the after visit summary with patients personalized plan was offered to patient via mail or my-chart. Patient would like to access on my-chart

## 2021-12-01 NOTE — Patient Instructions (Signed)
Ms. Rachel Blair , Thank you for taking time to come for your Medicare Wellness Visit. I appreciate your ongoing commitment to your health goals. Please review the following plan we discussed and let me know if I can assist you in the future.   Screening recommendations/referrals: Colonoscopy: completed due Mammogram: patient to schedule Bone Density: completed 12/20/2016 Recommended yearly ophthalmology/optometry visit for glaucoma screening and checkup Recommended yearly dental visit for hygiene and checkup  Vaccinations: Influenza vaccine: due 01/12/2022 Pneumococcal vaccine: completed 11/22/2016 Tdap vaccine: due Shingles vaccine: completed   Covid-19: 03/07/2020, 08/10/2019, 07/13/2019  Advanced directives: Advance directive discussed with you today. Even though you declined this today please call our office should you change your mind and we can give you the proper paperwork for you to fill out.  Conditions/risks identified: complaints of panic attacks. Appointment made with Dr. Fabian Sharp  Next appointment: Follow up in one year for your annual wellness visit    Preventive Care 71 Years and Older, Female Preventive care refers to lifestyle choices and visits with your health care provider that can promote health and wellness. What does preventive care include? A yearly physical exam. This is also called an annual well check. Dental exams once or twice a year. Routine eye exams. Ask your health care provider how often you should have your eyes checked. Personal lifestyle choices, including: Daily care of your teeth and gums. Regular physical activity. Eating a healthy diet. Avoiding tobacco and drug use. Limiting alcohol use. Practicing safe sex. Taking low-dose aspirin every day. Taking vitamin and mineral supplements as recommended by your health care provider. What happens during an annual well check? The services and screenings done by your health care provider during your annual well  check will depend on your age, overall health, lifestyle risk factors, and family history of disease. Counseling  Your health care provider may ask you questions about your: Alcohol use. Tobacco use. Drug use. Emotional well-being. Home and relationship well-being. Sexual activity. Eating habits. History of falls. Memory and ability to understand (cognition). Work and work Astronomer. Reproductive health. Screening  You may have the following tests or measurements: Height, weight, and BMI. Blood pressure. Lipid and cholesterol levels. These may be checked every 5 years, or more frequently if you are over 27 years old. Skin check. Lung cancer screening. You may have this screening every year starting at age 71 if you have a 30-pack-year history of smoking and currently smoke or have quit within the past 15 years. Fecal occult blood test (FOBT) of the stool. You may have this test every year starting at age 71. Flexible sigmoidoscopy or colonoscopy. You may have a sigmoidoscopy every 5 years or a colonoscopy every 10 years starting at age 71. Hepatitis C blood test. Hepatitis B blood test. Sexually transmitted disease (STD) testing. Diabetes screening. This is done by checking your blood sugar (glucose) after you have not eaten for a while (fasting). You may have this done every 1-3 years. Bone density scan. This is done to screen for osteoporosis. You may have this done starting at age 71. Mammogram. This may be done every 1-2 years. Talk to your health care provider about how often you should have regular mammograms. Talk with your health care provider about your test results, treatment options, and if necessary, the need for more tests. Vaccines  Your health care provider may recommend certain vaccines, such as: Influenza vaccine. This is recommended every year. Tetanus, diphtheria, and acellular pertussis (Tdap, Td) vaccine. You may need  a Td booster every 10 years. Zoster  vaccine. You may need this after age 30. Pneumococcal 13-valent conjugate (PCV13) vaccine. One dose is recommended after age 71. Pneumococcal polysaccharide (PPSV23) vaccine. One dose is recommended after age 71. Talk to your health care provider about which screenings and vaccines you need and how often you need them. This information is not intended to replace advice given to you by your health care provider. Make sure you discuss any questions you have with your health care provider. Document Released: 06/27/2015 Document Revised: 02/18/2016 Document Reviewed: 04/01/2015 Elsevier Interactive Patient Education  2017 [place] Prevention in the Home Falls can cause injuries. They can happen to people of all ages. There are many things you can do to make your home safe and to help prevent falls. What can I do on the outside of my home? Regularly fix the edges of walkways and driveways and fix any cracks. Remove anything that might make you trip as you walk through a door, such as a raised step or threshold. Trim any bushes or trees on the path to your home. Use bright outdoor lighting. Clear any walking paths of anything that might make someone trip, such as rocks or tools. Regularly check to see if handrails are loose or broken. Make sure that both sides of any steps have handrails. Any raised decks and porches should have guardrails on the edges. Have any leaves, snow, or ice cleared regularly. Use sand or salt on walking paths during winter. Clean up any spills in your garage right away. This includes oil or grease spills. What can I do in the bathroom? Use night lights. Install grab bars by the toilet and in the tub and shower. Do not use towel bars as grab bars. Use non-skid mats or decals in the tub or shower. If you need to sit down in the shower, use a plastic, non-slip stool. Keep the floor dry. Clean up any water that spills on the floor as soon as it happens. Remove  soap buildup in the tub or shower regularly. Attach bath mats securely with double-sided non-slip rug tape. Do not have throw rugs and other things on the floor that can make you trip. What can I do in the bedroom? Use night lights. Make sure that you have a light by your bed that is easy to reach. Do not use any sheets or blankets that are too big for your bed. They should not hang down onto the floor. Have a firm chair that has side arms. You can use this for support while you get dressed. Do not have throw rugs and other things on the floor that can make you trip. What can I do in the kitchen? Clean up any spills right away. Avoid walking on wet floors. Keep items that you use a lot in easy-to-reach places. If you need to reach something above you, use a strong step stool that has a grab bar. Keep electrical cords out of the way. Do not use floor polish or wax that makes floors slippery. If you must use wax, use non-skid floor wax. Do not have throw rugs and other things on the floor that can make you trip. What can I do with my stairs? Do not leave any items on the stairs. Make sure that there are handrails on both sides of the stairs and use them. Fix handrails that are broken or loose. Make sure that handrails are as long as the stairways. Check  any carpeting to make sure that it is firmly attached to the stairs. Fix any carpet that is loose or worn. Avoid having throw rugs at the top or bottom of the stairs. If you do have throw rugs, attach them to the floor with carpet tape. Make sure that you have a light switch at the top of the stairs and the bottom of the stairs. If you do not have them, ask someone to add them for you. What else can I do to help prevent falls? Wear shoes that: Do not have high heels. Have rubber bottoms. Are comfortable and fit you well. Are closed at the toe. Do not wear sandals. If you use a stepladder: Make sure that it is fully opened. Do not climb a  closed stepladder. Make sure that both sides of the stepladder are locked into place. Ask someone to hold it for you, if possible. Clearly mark and make sure that you can see: Any grab bars or handrails. First and last steps. Where the edge of each step is. Use tools that help you move around (mobility aids) if they are needed. These include: Canes. Walkers. Scooters. Crutches. Turn on the lights when you go into a dark area. Replace any light bulbs as soon as they burn out. Set up your furniture so you have a clear path. Avoid moving your furniture around. If any of your floors are uneven, fix them. If there are any pets around you, be aware of where they are. Review your medicines with your doctor. Some medicines can make you feel dizzy. This can increase your chance of falling. Ask your doctor what other things that you can do to help prevent falls. This information is not intended to replace advice given to you by your health care provider. Make sure you discuss any questions you have with your health care provider. Document Released: 03/27/2009 Document Revised: 11/06/2015 Document Reviewed: 07/05/2014 Elsevier Interactive Patient Education  2017 [employer].

## 2021-12-03 ENCOUNTER — Telehealth (INDEPENDENT_AMBULATORY_CARE_PROVIDER_SITE_OTHER): Payer: Medicare Other | Admitting: Internal Medicine

## 2021-12-03 DIAGNOSIS — F410 Panic disorder [episodic paroxysmal anxiety] without agoraphobia: Secondary | ICD-10-CM

## 2021-12-03 DIAGNOSIS — Z638 Other specified problems related to primary support group: Secondary | ICD-10-CM | POA: Diagnosis not present

## 2021-12-03 DIAGNOSIS — F4322 Adjustment disorder with anxiety: Secondary | ICD-10-CM | POA: Diagnosis not present

## 2021-12-03 DIAGNOSIS — F430 Acute stress reaction: Secondary | ICD-10-CM | POA: Diagnosis not present

## 2021-12-03 MED ORDER — LORAZEPAM 1 MG PO TABS: 1.0000 mg | ORAL_TABLET | Freq: Three times a day (TID) | ORAL | 0 refills | Status: DC | PRN

## 2021-12-03 NOTE — Progress Notes (Signed)
Virtual Visit via Video Note  I connected with Rachel Blair on 12/03/21 at  8:30 AM EDT by a video enabled telemedicine application and verified that I am speaking with the correct person using two identifiers. Location patient: home Location provider:\home office Persons participating in the virtual visit: patient, provider  [place] national recommendations  regarding COVID 19 pandemic   video visit is advised over in office visit for this patient.  Patient aware  of the limitations of evaluation and management by telemedicine and  availability of in person appointments. and agreed to proceed.   HPI: Rachel Blair presents for video visit   incfreasing panic attacks  since family disruption. Ex daughter in law  too k grand daughter age 80 and not seen over past month. Custody issues ongoing   cps involved  other .   Court date next week.   Getting panic  shortness of breath  heart racing   remote hs of panic when younger never a ling term issue.   Not helpful? orazepam only used for travel . Remote hs of sertraline  . Denies depressive  suicidal  sx .  Just anxiety and stress.  Sleep not that good even with ambien and cpap.  Has panic  about 3-4 days per week.   Neg tdsupp  ROS: See pertinent positives and negatives per HPI.  Past Medical History:  Diagnosis Date   Allergy    Arthritis    CHEST DISCOMFORT 08/30/2007   Qualifier: Diagnosis of  By: Regis Bill MD, Standley Brooking    Chest pain    atypical   History of pneumonia    Hypertension    Nasal septal deviation    OBSTRUCTIVE SLEEP APNEA 08/30/2007   Qualifier: Diagnosis of  By: Regis Bill MD, Standley Brooking    Odontogenic cyst    of left maxillary sinus   OSA (obstructive sleep apnea)    moderatley severe, uses CPAP nightly   Trigger thumb of left hand     Past Surgical History:  Procedure Laterality Date   biopsy and bone graft of left maxillary sinus odontogenic cyst     CHOLECYSTECTOMY     left thumb trigger finger release Right     TONSILLECTOMY     TRIGGER FINGER RELEASE Left 05/27/2015   Procedure: RELEASE TRIGGER FINGER/A-1 PULLEY LEFT THUMB;  Surgeon: Daryll Brod, MD;  Location: [place];  Service: Orthopedics;  Laterality: Left;   TUBAL LIGATION      Family History  Problem Relation Age of Onset   Heart disease Mother    Stroke Father    Heart failure Father    Emphysema Father    Rheum arthritis Daughter    Breast cancer Paternal Aunt    Snoring Brother    Lung cancer Brother     Social History   Tobacco Use   Smoking status: Former    Packs/day: 1.00    Years: 30.00    Total pack years: 30.00    Types: Cigarettes    Quit date: 06/14/1998    Years since quitting: 23.4   Smokeless tobacco: Never  Vaping Use   Vaping Use: Never used  Substance Use Topics   Alcohol use: Yes    Comment: once a month   Drug use: No      Current Outpatient Medications:    ACCU-CHEK GUIDE test strip, TEST BLOOD SUGAR 1-2 TIMES PER DAY, Disp: 50 strip, Rfl: 1   Accu-Chek Softclix Lancets lancets, 2 each  by Other route daily., Disp: 100 each, Rfl: 1   blood glucose meter kit and supplies KIT, Dispense based on patient and insurance preference. Use up to 1-2 times daily as directed., Disp: 1 each, Rfl: 0   cycloSPORINE (RESTASIS) 0.05 % ophthalmic emulsion, Place 1 drop into both eyes 2 (two) times daily., Disp: , Rfl:    dapagliflozin propanediol (FARXIGA) 5 MG TABS tablet, Take 1 tablet (5 mg total) by mouth daily before breakfast., Disp: 30 tablet, Rfl: 3   hydrochlorothiazide (HYDRODIURIL) 25 MG tablet, Take 1 tablet (25 mg total) by mouth daily., Disp: 90 tablet, Rfl: 2   ketorolac (TORADOL) 10 MG tablet, Take 1 tablet (10 mg total) by mouth every 6 (six) hours as needed., Disp: 20 tablet, Rfl: 0   LORazepam (ATIVAN) 0.5 MG tablet, TAKE 1 TO 2 TABLETS BY MOUTH BEFORE FLIGHT AS NEEDED AND AS DIRECTED. DO NOT TAKE WITH AMBIEN, Disp: 10 tablet, Rfl: 0   LORazepam (ATIVAN) 1 MG tablet, Take 1 tablet (1  mg total) by mouth every 8 (eight) hours as needed for anxiety., Disp: 30 tablet, Rfl: 0   metFORMIN (GLUCOPHAGE-XR) 500 MG 24 hr tablet, Take 500 mg by mouth daily., Disp: , Rfl:    rosuvastatin (CRESTOR) 5 MG tablet, TAKE 1 TABLET(5 MG) BY MOUTH DAILY, Disp: 90 tablet, Rfl: 1   zolpidem (AMBIEN) 10 MG tablet, TAKE 1 TABLET(10 MG) BY MOUTH AT BEDTIME AS NEEDED FOR SLEEP, Disp: 90 tablet, Rfl: 0  EXAM: BP Readings from Last 3 Encounters:  10/29/21 112/78  10/26/21 136/83  10/05/21 116/72    VITALS per patient if applicable:  GENERAL: alert, oriented, appears well and in no acute distress  HEENT: atraumatic, conjunttiva clear, no obvious abnormalities on inspection of external nose and ears  NECK: normal movements of the head and neck  LUNGS: on inspection no signs of respiratory distress, breathing rate appears normal, no obvious gross SOB, gasping or wheezing  CV: no obvious cyanosis  MS: moves all visible extremities without noticeable abnormality  PSYCH/NEURO: pleasant and cooperative, speech and thought processing grossly intact stress emotional  appropriate  for situation Lab Results  Component Value Date   WBC 8.0 10/29/2021   HGB 15.5 (H) 10/29/2021   HCT 45.3 10/29/2021   PLT 227.0 10/29/2021   GLUCOSE 119 (H) 10/29/2021   CHOL 119 06/30/2021   TRIG 171.0 (H) 06/30/2021   HDL 43.10 06/30/2021   LDLDIRECT 112.0 02/19/2021   LDLCALC 41 06/30/2021   ALT 24 10/29/2021   AST 15 10/29/2021   NA 140 10/29/2021   K 3.5 10/29/2021   CL 101 10/29/2021   CREATININE 0.82 10/29/2021   BUN 17 10/29/2021   CO2 30 10/29/2021   TSH 1.84 03/23/2019   HGBA1C 6.0 (A) 10/29/2021    ASSESSMENT AND PLAN:  Discussed the following assessment and plan:    ICD-10-CM   1. Panic attack due to exceptional stress  F41.0    F43.0     2. Adjustment disorder with anxious mood  F43.22     3. Family disruption  Z63.8      Disc use of  controller med such as low dose citalopram 10  and rescue  ativan   At this time will try rescue med  lorazepam 1 mg  up to every 8 max prn.  Plan 3 weeks  or so update visit or  update about med and  assessment  Disc social supports psych techniques .   Counseled. Risk benefot  at this time   Expectant management and discussion of plan and treatment with opportunity to ask questions and all were answered. The patient agreed with the plan and demonstrated an understanding of the instructions.   Advised to call back or seek an in-person evaluation if worsening  or having  further concerns  in interim. Return for 2-3 weeks  assessment .    Shanon Ace, MD

## 2021-12-04 ENCOUNTER — Other Ambulatory Visit: Payer: Self-pay | Admitting: Internal Medicine

## 2021-12-11 ENCOUNTER — Other Ambulatory Visit: Payer: Self-pay | Admitting: Internal Medicine

## 2022-01-06 ENCOUNTER — Other Ambulatory Visit: Payer: Self-pay | Admitting: Internal Medicine

## 2022-01-06 DIAGNOSIS — Z1231 Encounter for screening mammogram for malignant neoplasm of breast: Secondary | ICD-10-CM

## 2022-01-21 ENCOUNTER — Other Ambulatory Visit: Payer: Self-pay | Admitting: Internal Medicine

## 2022-01-22 ENCOUNTER — Other Ambulatory Visit: Payer: Self-pay | Admitting: Internal Medicine

## 2022-01-25 ENCOUNTER — Other Ambulatory Visit: Payer: Self-pay | Admitting: Family Medicine

## 2022-01-25 MED ORDER — ZOLPIDEM TARTRATE 10 MG PO TABS: ORAL_TABLET | ORAL | 0 refills | Status: DC

## 2022-01-28 ENCOUNTER — Telehealth: Payer: Self-pay

## 2022-01-28 ENCOUNTER — Ambulatory Visit: Payer: Medicare Other

## 2022-01-28 NOTE — Telephone Encounter (Signed)
Key: [id number]

## 2022-01-29 ENCOUNTER — Ambulatory Visit
Admission: RE | Admit: 2022-01-29 | Discharge: 2022-01-29 | Disposition: A | Discharge status: Home / Self Care | Payer: Medicare Other | Source: Ambulatory Visit | Attending: Internal Medicine | Admitting: Internal Medicine

## 2022-01-29 DIAGNOSIS — Z1231 Encounter for screening mammogram for malignant neoplasm of breast: Secondary | ICD-10-CM

## 2022-01-29 NOTE — Telephone Encounter (Signed)
Approved on August 17 Request Reference Number: [id number]. ZOLPIDEM TAB 10MG  is approved through 06/13/2022.

## 2022-01-29 NOTE — Telephone Encounter (Signed)
Spoke to Maralyn Sago from [employer]. Inform the PA is approved. She inform that patient had pick up the Rx on 8/14 using [facility] for $45.85. They couldn't run it through due to patient already pick it up and there was no refill.

## 2022-02-16 ENCOUNTER — Telehealth (INDEPENDENT_AMBULATORY_CARE_PROVIDER_SITE_OTHER): Payer: Medicare Other | Admitting: Internal Medicine

## 2022-02-16 ENCOUNTER — Encounter: Payer: Self-pay | Admitting: Internal Medicine

## 2022-02-16 VITALS — Ht 65.0 in | Wt 230.0 lb

## 2022-02-16 DIAGNOSIS — I10 Essential (primary) hypertension: Secondary | ICD-10-CM | POA: Diagnosis not present

## 2022-02-16 DIAGNOSIS — E669 Obesity, unspecified: Secondary | ICD-10-CM

## 2022-02-16 DIAGNOSIS — F4322 Adjustment disorder with anxiety: Secondary | ICD-10-CM

## 2022-02-16 DIAGNOSIS — E1165 Type 2 diabetes mellitus with hyperglycemia: Secondary | ICD-10-CM

## 2022-02-16 DIAGNOSIS — Z79899 Other long term (current) drug therapy: Secondary | ICD-10-CM | POA: Diagnosis not present

## 2022-02-16 DIAGNOSIS — E785 Hyperlipidemia, unspecified: Secondary | ICD-10-CM

## 2022-02-16 DIAGNOSIS — Z638 Other specified problems related to primary support group: Secondary | ICD-10-CM

## 2022-02-16 NOTE — Progress Notes (Signed)
Virtual Visit via Telephone Note  I connected with  Rachel Blair  on 02/16/22 at 10:00 AM EDT by telephone and verified that I am speaking with the correct person using two identifiers.   I discussed the limitations, risks, security and privacy concerns of performing an evaluation and management service by telephone and the limited availability of in person appointments. tThere may be a patient responsible charge related to this service. The patient expressed understanding and agreed to proceed.  Location patient: home Location provider:  home office Participants present for the call: patient, provider Patient did not have a visit in the prior 7 days to address this/these issue(s). Changed from in person to tele  History of Present Illness: Rachel Blair presents for follow-up of a number of issues   Panic much better    ocass  lorazepam  still   difficult.   Probably has 28 pills left is coping.  External factors  Dm Bg  ave 140 - 150 can check all the time is taking the Farxiga 5 mg no reported significant side effects her weight over the last 3 months has gone from 2 30-2 19 she may be interested in medicines that will also help control weight and metabolic.  HLD:  taking rosuvastatin   no concerns reported  Observations/Objective: Patient sounds personable and well on the phone. I do not appreciate any SOB. Speech and thought processing are grossly intact. Patient reported vitals: Lab Results  Component Value Date   WBC 8.0 10/29/2021   HGB 15.5 (H) 10/29/2021   HCT 45.3 10/29/2021   PLT 227.0 10/29/2021   GLUCOSE 119 (H) 10/29/2021   CHOL 119 06/30/2021   TRIG 171.0 (H) 06/30/2021   HDL 43.10 06/30/2021   LDLDIRECT 112.0 02/19/2021   LDLCALC 41 06/30/2021   ALT 24 10/29/2021   AST 15 10/29/2021   NA 140 10/29/2021   K 3.5 10/29/2021   CL 101 10/29/2021   CREATININE 0.82 10/29/2021   BUN 17 10/29/2021   CO2 30 10/29/2021   TSH 1.84 03/23/2019   HGBA1C 6.0 (A)  10/29/2021    Assessment and Plan:  Type 2 diabetes mellitus with hyperglycemia, without long-term current use of insulin (HCC) - Plan: Lipid panel, Hemoglobin A1c  Adjustment disorder with anxious mood improved   Essential hypertension - Plan: Lipid panel, Hemoglobin A1c  Medication management - Plan: Lipid panel, Hemoglobin A1c  Hyperlipidemia, unspecified hyperlipidemia type - Plan: Lipid panel, Hemoglobin A1c  Family disruption - coping much better   rare use of lorazepam .   Obesity (BMI 30-39.9)  Disc glp1 meds and mounjaro as possible to help with weight and metabolic control    but will wait on  lab results  first .  Considered Risk benefits of the medicine classes  Follow Up Instructions: Fasting lab  lipid and hg a1c  Then go from there  4+ months depending    [id number] 5-10 [id number] 11-20 [id number] 21-30 I did not refer this patient for an OV in the next 24 hours for this/these issue(s).  I discussed the assessment and treatment plan with the patient. The patient was provided an opportunity to ask questions and answered. The patient agreed with the plan and demonstrated an understanding of the instructions.   The patient was advised to call back or seek an in-person evaluation if needed in interim   I provided 24 minutes of non-face-to-face time during this encounter. Return for depending on results  4 mos .  Rachel Ace, MD

## 2022-02-17 ENCOUNTER — Other Ambulatory Visit (INDEPENDENT_AMBULATORY_CARE_PROVIDER_SITE_OTHER): Payer: Medicare Other

## 2022-02-17 DIAGNOSIS — E785 Hyperlipidemia, unspecified: Secondary | ICD-10-CM | POA: Diagnosis not present

## 2022-02-17 DIAGNOSIS — Z79899 Other long term (current) drug therapy: Secondary | ICD-10-CM

## 2022-02-17 DIAGNOSIS — I10 Essential (primary) hypertension: Secondary | ICD-10-CM

## 2022-02-17 DIAGNOSIS — E1165 Type 2 diabetes mellitus with hyperglycemia: Secondary | ICD-10-CM | POA: Diagnosis not present

## 2022-02-17 LAB — LIPID PANEL
Cholesterol: 117 mg/dL (ref 0–200)
HDL: 44.7 mg/dL (ref >39.00)
NonHDL: 72.59
Total CHOL/HDL Ratio: 3
Triglycerides: 203 mg/dL — ABNORMAL HIGH (ref 0.0–149.0)
VLDL: 40.6 mg/dL — ABNORMAL HIGH (ref 0.0–40.0)

## 2022-02-17 LAB — HEMOGLOBIN A1C: Hgb A1c MFr Bld: 6.5 % (ref 4.6–6.5)

## 2022-02-17 LAB — LDL CHOLESTEROL, DIRECT: Direct LDL: 52 mg/dL

## 2022-02-17 NOTE — Progress Notes (Signed)
Ldl is at goal,  a1c is 6.5  if she wishes we can try adding mounjaro or ozempic injections . Rybelsus( pills)     let us know if she wants to proceed with either

## 2022-02-19 ENCOUNTER — Ambulatory Visit (INDEPENDENT_AMBULATORY_CARE_PROVIDER_SITE_OTHER): Payer: Medicare Other | Admitting: Sports Medicine

## 2022-02-19 DIAGNOSIS — M23207 Derangement of unspecified meniscus due to old tear or injury, left knee: Secondary | ICD-10-CM

## 2022-02-19 DIAGNOSIS — M25562 Pain in left knee: Secondary | ICD-10-CM | POA: Diagnosis not present

## 2022-02-19 DIAGNOSIS — G8929 Other chronic pain: Secondary | ICD-10-CM

## 2022-02-19 MED ORDER — SODIUM HYALURONATE 60 MG/3ML IX PRSY
60.0000 mg | PREFILLED_SYRINGE | Freq: Once | INTRA_ARTICULAR | Status: AC
  Administered 2022-02-19: 60 mg via INTRA_ARTICULAR

## 2022-02-19 NOTE — Progress Notes (Signed)
Rachel Blair D.[place] Phone: [phone]   Assessment and Plan:     1. Chronic pain of left knee 2. 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  With past medical history of DM type II, CSI can increase patient's blood glucose, so patient elected to trial HA injection at today's visit.  Tolerated well per note below  Procedure: Knee Joint Injection Side: Left Indication: Flare of chronic knee pain  Risks explained and consent was given verbally. The site was cleaned with alcohol prep. A needle was introduced with an anterio-lateral approach. Injection given using Durolane 60 mg / 3 mL.  This was well tolerated and resulted in symptomatic relief.  Needle was removed, hemostasis achieved, and post injection instructions were explained.   Pt was advised to call or return to clinic if these symptoms worsen or fail to improve as anticipated.   Pertinent previous records reviewed include none   Follow Up: 4 weeks for reevaluation to see if HA injection was beneficial.  If patient did not receive significant benefit from HA injection, could consider Zilretta injection in the future   Subjective:   I, Rachel Blair, am serving as a Education administrator for [employer]  Chief Complaint: gel injection   HPI:   02/19/22 Patient is here for gel injection.  Patient overall experienced 6 to 7 months relief from last CSI, however she has been active around her house and is felt a flare of knee pain.  Interested in additional injection today.  Denies new trauma, falls, numbness/tingling/weakness  Relevant Historical Information: DM type II  Additional pertinent review of systems negative.   Current Outpatient Medications:    ACCU-CHEK GUIDE test strip, USE TO TEST  BLOOD SUGAR ONCE TO TWICE DAILY AS DIRECTED, Disp: 50 strip, Rfl: 1   Accu-Chek Softclix Lancets lancets, 2 each by Other route daily., Disp: 100 each, Rfl: 1   Blood Glucose Monitoring Suppl (ACCU-CHEK GUIDE) w/Device KIT, USE UP TO 1-2 TIMES DAILY AS DIRECTED, Disp: 1 kit, Rfl: 2   cycloSPORINE (RESTASIS) 0.05 % ophthalmic emulsion, Place 1 drop into both eyes 2 (two) times daily., Disp: , Rfl:    FARXIGA 5 MG TABS tablet, TAKE 1 TABLET(5 MG) BY MOUTH DAILY BEFORE BREAKFAST, Disp: 30 tablet, Rfl: 3   hydrochlorothiazide (HYDRODIURIL) 25 MG tablet, TAKE 1 TABLET(25 MG) BY MOUTH DAILY, Disp: 90 tablet, Rfl: 0   ketorolac (TORADOL) 10 MG tablet, Take 1 tablet (10 mg total) by mouth every 6 (six) hours as needed. (Patient not taking: Reported on 02/16/2022), Disp: 20 tablet, Rfl: 0   LORazepam (ATIVAN) 1 MG tablet, Take 1 tablet (1 mg total) by mouth every 8 (eight) hours as needed for anxiety., Disp: 30 tablet, Rfl: 0   metFORMIN (GLUCOPHAGE-XR) 500 MG 24 hr tablet, Take 500 mg by mouth daily. (Patient not taking: Reported on 02/16/2022), Disp: , Rfl:    rosuvastatin (CRESTOR) 5 MG tablet, TAKE 1 TABLET(5 MG) BY MOUTH DAILY, Disp: 90 tablet, Rfl: 1   zolpidem (AMBIEN) 10 MG tablet, TAKE 1 TABLET(10 MG) BY MOUTH AT BEDTIME AS NEEDED FOR SLEEP, Disp: 90 tablet, Rfl: 0   Objective:     Height $Remov'5\' 4"'ZUOOVJ$    Physical Exam:    General:  awake, alert oriented, no acute distress nontoxic Skin: no suspicious lesions  or rashes Neuro:sensation intact, no deficits, strength 5/5 with no deficits, no atrophy, normal muscle tone Psych: No signs of anxiety, depression or other mood disorder  Left knee: No swelling No deformity Neg fluid wave, joint milking ROM Flex 100, Ext 10 TTP medial lateral joint line NTTP over the quad tendon, medial fem condyle, lat fem condyle, patella, plica, patella tendon, tibial tuberostiy, fibular head, posterior fossa, pes anserine bursa, gerdy's tubercle,     Neg lachman Neg sag  sign Negative varus stress Negative valgus stress   Electronically signed by:  Rachel Blair D.[name] 8:46 AM 02/19/22

## 2022-02-19 NOTE — Patient Instructions (Signed)
Good to see  you  4 week follow up to discuss results from gel injection

## 2022-03-01 ENCOUNTER — Other Ambulatory Visit: Payer: Self-pay | Admitting: Internal Medicine

## 2022-03-06 ENCOUNTER — Emergency Department (HOSPITAL_BASED_OUTPATIENT_CLINIC_OR_DEPARTMENT_OTHER)
Admission: EM | Admit: 2022-03-06 | Discharge: 2022-03-06 | Disposition: A | Discharge status: Home / Self Care | Payer: Medicare Other | Attending: Emergency Medicine | Admitting: Emergency Medicine

## 2022-03-06 ENCOUNTER — Encounter (HOSPITAL_BASED_OUTPATIENT_CLINIC_OR_DEPARTMENT_OTHER): Payer: Self-pay | Admitting: *Deleted

## 2022-03-06 ENCOUNTER — Encounter (HOSPITAL_COMMUNITY): Payer: Self-pay | Admitting: Emergency Medicine

## 2022-03-06 ENCOUNTER — Ambulatory Visit (HOSPITAL_COMMUNITY)
Admission: EM | Admit: 2022-03-06 | Discharge: 2022-03-06 | Disposition: A | Discharge status: Home / Self Care | Payer: Medicare Other | Attending: Family Medicine | Admitting: Family Medicine

## 2022-03-06 ENCOUNTER — Emergency Department (HOSPITAL_BASED_OUTPATIENT_CLINIC_OR_DEPARTMENT_OTHER): Payer: Medicare Other

## 2022-03-06 ENCOUNTER — Other Ambulatory Visit: Payer: Self-pay

## 2022-03-06 DIAGNOSIS — Z7984 Long term (current) use of oral hypoglycemic drugs: Secondary | ICD-10-CM | POA: Diagnosis not present

## 2022-03-06 DIAGNOSIS — E119 Type 2 diabetes mellitus without complications: Secondary | ICD-10-CM | POA: Insufficient documentation

## 2022-03-06 DIAGNOSIS — Z79899 Other long term (current) drug therapy: Secondary | ICD-10-CM | POA: Diagnosis not present

## 2022-03-06 DIAGNOSIS — I10 Essential (primary) hypertension: Secondary | ICD-10-CM | POA: Diagnosis not present

## 2022-03-06 DIAGNOSIS — R079 Chest pain, unspecified: Secondary | ICD-10-CM | POA: Diagnosis not present

## 2022-03-06 LAB — COMPREHENSIVE METABOLIC PANEL
ALT: 29 U/L (ref 0–44)
AST: 20 U/L (ref 15–41)
Albumin: 4.5 g/dL (ref 3.5–5.0)
Alkaline Phosphatase: 49 U/L (ref 38–126)
Anion gap: 10 (ref 5–15)
BUN: 16 mg/dL (ref 8–23)
CO2: 28 mmol/L (ref 22–32)
Calcium: 9.6 mg/dL (ref 8.9–10.3)
Chloride: 101 mmol/L (ref 98–111)
Creatinine, Ser: 1.1 mg/dL — ABNORMAL HIGH (ref 0.44–1.00)
GFR, Estimated: 54 mL/min — ABNORMAL LOW (ref >60)
Glucose, Bld: 133 mg/dL — ABNORMAL HIGH (ref 70–99)
Potassium: 3.3 mmol/L — ABNORMAL LOW (ref 3.5–5.1)
Sodium: 139 mmol/L (ref 135–145)
Total Bilirubin: 1.2 mg/dL (ref 0.3–1.2)
Total Protein: 6.7 g/dL (ref 6.5–8.1)

## 2022-03-06 LAB — CBC WITH DIFFERENTIAL/PLATELET
Abs Immature Granulocytes: 0.02 10*3/uL (ref 0.00–0.07)
Basophils Absolute: 0.1 10*3/uL (ref 0.0–0.1)
Basophils Relative: 1 %
Eosinophils Absolute: 0.2 10*3/uL (ref 0.0–0.5)
Eosinophils Relative: 3 %
HCT: 44.8 % (ref 36.0–46.0)
Hemoglobin: 15.5 g/dL — ABNORMAL HIGH (ref 12.0–15.0)
Immature Granulocytes: 0 %
Lymphocytes Relative: 37 %
Lymphs Abs: 2.8 10*3/uL (ref 0.7–4.0)
MCH: 30.1 pg (ref 26.0–34.0)
MCHC: 34.6 g/dL (ref 30.0–36.0)
MCV: 87 fL (ref 80.0–100.0)
Monocytes Absolute: 0.6 10*3/uL (ref 0.1–1.0)
Monocytes Relative: 7 %
Neutro Abs: 3.9 10*3/uL (ref 1.7–7.7)
Neutrophils Relative %: 52 %
Platelets: 203 10*3/uL (ref 150–400)
RBC: 5.15 MIL/uL — ABNORMAL HIGH (ref 3.87–5.11)
RDW: 13.2 % (ref 11.5–15.5)
WBC: 7.6 10*3/uL (ref 4.0–10.5)
nRBC: 0 % (ref 0.0–0.2)

## 2022-03-06 LAB — TROPONIN I (HIGH SENSITIVITY)
Troponin I (High Sensitivity): <2 ng/L (ref <18)
Troponin I (High Sensitivity): 2 ng/L (ref <18)

## 2022-03-06 LAB — LIPASE, BLOOD: Lipase: 12 U/L (ref 11–51)

## 2022-03-06 NOTE — ED Triage Notes (Signed)
"  Something is pinching my heart" started today.  Rachel Blair, left side of neck felt like a hair was being dragged across it.  Felt slight headed.  No nausea, no vomiting, no sob.  Left shoulder blade feels like a muscles strain

## 2022-03-06 NOTE — ED Notes (Signed)
Patient access denies needing patient for any further information

## 2022-03-06 NOTE — ED Provider Notes (Signed)
[facility] EMERGENCY DEPT Provider Note   CSN: [id number] Arrival date & time: 03/06/22  1343     History  Chief Complaint  Patient presents with   Chest Pain    Rachel Blair is a 71 y.o. female with Hx of hyperlipidemia, adjustment disorder, DMT2, essential HTN, GERD, ADHD, OSA.  Presenting to the ED today with episodic chest pain.  Started this morning, was there when she woke up.  Described as intermittent, pinching, fleeting, nonradiating, and sharp.  Lasted a few seconds before relieving on own.  Also noted some neck stiffness when she woke up this morning, that is since improved.  Without active chest pain.  Occurs at random, not worsened by exertion or accompanied with shortness of breath.  No recent upper respiratory infection.  Denies stiff neck, headache, abdominal pain.  The history is provided by the patient and medical records.  Chest Pain    Home Medications Prior to Admission medications   Medication Sig Start Date End Date Taking? Authorizing Provider  ACCU-CHEK GUIDE test strip USE TO TEST BLOOD SUGAR ONCE TO TWICE DAILY AS DIRECTED 01/26/22   Panosh, Standley Brooking, MD  Accu-Chek Softclix Lancets lancets USE 2 LANCETS DAILY 03/02/22   Panosh, Standley Brooking, MD  Blood Glucose Monitoring Suppl (ACCU-CHEK GUIDE) w/Device KIT USE UP TO 1-2 TIMES DAILY AS DIRECTED 01/28/22   Panosh, Standley Brooking, MD  cycloSPORINE (RESTASIS) 0.05 % ophthalmic emulsion Place 1 drop into both eyes 2 (two) times daily.    [provider]  FARXIGA 5 MG TABS tablet TAKE 1 TABLET(5 MG) BY MOUTH DAILY BEFORE BREAKFAST 12/07/21   Panosh, Standley Brooking, MD  hydrochlorothiazide (HYDRODIURIL) 25 MG tablet TAKE 1 TABLET(25 MG) BY MOUTH DAILY 01/21/22   Panosh, Standley Brooking, MD  ketorolac (TORADOL) 10 MG tablet Take 1 tablet (10 mg total) by mouth every 6 (six) hours as needed. Patient not taking: Reported on 02/16/2022 10/26/21   Hazel Sams, PA-C  LORazepam (ATIVAN) 1 MG tablet Take 1 tablet (1 mg total) by  mouth every 8 (eight) hours as needed for anxiety. 12/03/21   Panosh, Standley Brooking, MD  metFORMIN (GLUCOPHAGE-XR) 500 MG 24 hr tablet Take 500 mg by mouth daily. Patient not taking: Reported on 02/16/2022 02/06/21   [provider]  rosuvastatin (CRESTOR) 5 MG tablet TAKE 1 TABLET(5 MG) BY MOUTH DAILY 11/30/21   Panosh, Standley Brooking, MD  zolpidem (AMBIEN) 10 MG tablet TAKE 1 TABLET(10 MG) BY MOUTH AT BEDTIME AS NEEDED FOR SLEEP 01/25/22   Panosh, Standley Brooking, MD      Allergies    Amphetamine-dextroamphet er, Losartan, Duloxetine, Hydrocodone-acetaminophen, Metformin and related, and Morphine sulfate    Review of Systems   Review of Systems  Cardiovascular:  Positive for chest pain.    Physical Exam Updated Vital Signs BP 126/70   Pulse 61   Temp 98 F (36.7 C) (Oral)   Resp 12   SpO2 94%  Physical Exam Vitals and nursing note reviewed.  Constitutional:      General: She is not in acute distress.    Appearance: She is well-developed. She is obese. She is not ill-appearing, toxic-appearing or diaphoretic.  HENT:     Head: Normocephalic and atraumatic.  Eyes:     General: Lids are normal. Gaze aligned appropriately.     Conjunctiva/sclera: Conjunctivae normal.  Neck:     Thyroid: No thyromegaly.     Comments: No meningismus or torticollis.  Mild neck muscle tenderness, left. Cardiovascular:  Rate and Rhythm: Normal rate and regular rhythm.     Pulses:          Radial pulses are 2+ on the right side and 2+ on the left side.       Dorsalis pedis pulses are 2+ on the right side and 2+ on the left side.       Posterior tibial pulses are 2+ on the right side and 2+ on the left side.     Heart sounds: No murmur heard. Pulmonary:     Effort: Pulmonary effort is normal. No accessory muscle usage or respiratory distress.     Breath sounds: Normal breath sounds. No stridor. No wheezing or rales.     Comments: CTAB, able to communicate without difficulty, without increased respiratory  effort Chest:     Chest wall: Tenderness (Mild, chest wall) present. No mass, deformity, crepitus or edema.  Abdominal:     Palpations: Abdomen is soft. There is no pulsatile mass.     Tenderness: There is no abdominal tenderness. There is no right CVA tenderness, left CVA tenderness or guarding.  Musculoskeletal:        General: No swelling.     Cervical back: Neck supple.     Right lower leg: No tenderness. No edema.     Left lower leg: No tenderness. No edema.  Skin:    General: Skin is warm and dry.     Capillary Refill: Capillary refill takes less than 2 seconds.     Coloration: Skin is not cyanotic or pale.  Neurological:     Mental Status: She is alert and oriented to person, place, and time.     GCS: GCS eye subscore is 4. GCS verbal subscore is 5. GCS motor subscore is 6.     Cranial Nerves: No dysarthria or facial asymmetry.     Gait: Gait is intact.     Comments: Coordination and motor function appear grossly intact.    Psychiatric:        Mood and Affect: Mood normal.     ED Results / Procedures / Treatments   Labs (all labs ordered are listed, but only abnormal results are displayed) Labs Reviewed  CBC WITH DIFFERENTIAL/PLATELET - Abnormal; Notable for the following components:      Result Value   RBC 5.15 (*)    Hemoglobin 15.5 (*)    All other components within normal limits  COMPREHENSIVE METABOLIC PANEL - Abnormal; Notable for the following components:   Potassium 3.3 (*)    Glucose, Bld 133 (*)    Creatinine, Ser 1.10 (*)    GFR, Estimated 54 (*)    All other components within normal limits  LIPASE, BLOOD  TROPONIN I (HIGH SENSITIVITY)  TROPONIN I (HIGH SENSITIVITY)    EKG EKG Interpretation  Date/Time:  Saturday March 06 2022 13:59:00 EDT Ventricular Rate:  73 PR Interval:  174 QRS Duration: 84 QT Interval:  398 QTC Calculation: 439 R Axis:   -48 Text Interpretation: Sinus arrhythmia Left anterior fascicular block Confirmed by Lennice Sites ([id number]) on 03/06/2022 2:06:14 PM  Radiology DG Chest Portable 1 View  Result Date: 03/06/2022 CLINICAL DATA:  Chest pain EXAM: PORTABLE CHEST 1 VIEW COMPARISON:  06/27/2018 and prior radiographs FINDINGS: The cardiomediastinal silhouette is unremarkable. There is no evidence of focal airspace disease, pulmonary edema, suspicious pulmonary nodule/mass, pleural effusion, or pneumothorax. No acute bony abnormalities are identified. IMPRESSION: No active disease. Electronically Signed   By: Cleatis Polka.D.  On: 03/06/2022 14:33    Procedures Procedures    Medications Ordered in ED Medications - No data to display  ED Course/ Medical Decision Making/ A&P Clinical Course as of 03/06/22 1612  Sat Mar 06, 2022  1415 SpO2: 93 % Personally witnessed pt saturation of 99-100% on room air  [AC]  1611 SpO2: 94 % Personally witnessed pt saturation of 99-100% on room air [AC]    Clinical Course User Index [AC] Prince Rome, PA-C                           Medical Decision Making Amount and/or Complexity of Data Reviewed Labs: ordered. Radiology: ordered.   71 y.o. female presents to the ED for concern of Chest Pain   This involves an extensive number of treatment options, and is a complaint that carries with it a high risk of complications and morbidity.     Past Medical History / Co-morbidities / Social History: Hx of hyperlipidemia, adjustment disorder, DMT2, essential HTN, GERD, ADHD, OSA.   Social Determinants of Health include: Elderly  Additional History:  Obtained by chart review.  Notably recent panic attacks due to increased stress and anxiousness  Lab Tests: I ordered, and personally interpreted labs.  The pertinent results include:   Troponin, <2, and 2 Lipase 12 Mildly elevated Hgb 15.5, nonspecific Creatinine 1.10, BUN 16, potassium 3.3  Imaging Studies: I ordered imaging studies including CXR.   I independently visualized and interpreted imaging which  showed no evidence of acute cardiopulmonary pathology I agree with the radiologist interpretation.  ED Course: Pt well-appearing on exam.  Nontoxic, nonseptic appearing in NAD.  AAOx4.  Not diaphoretic or febrile.  Presenting to the ED today with episodic chest pain.  Started this morning, was there when she woke up.  Has had this happen a few times before.  Described as intermittent, pinching, fleeting, nonradiating, and sharp.  Relieves on own.  Also noted some neck stiffness when she woke up this morning, that has since improved.  No meningismus, torticollis, focal neurodeficits, or neck stiffness appreciated on exam.  Neck very supple.  Without active chest pain, though mild tenderness of chest wall with palpation on exam.  Occurs at random, not worsened by exertion, movements of upper extremities, or accompanied with shortness of breath.  No recent upper respiratory infection.  Without N/V/D, headache, abdominal pain. HEART score of 4 due to 3+ risk factors and age, low-moderate risk.  Considered ASA and nitroglycerin, however do not seem appropriate at this time.  Without shortness of breath, clinical evidence of DVT, tachycardia, recent malignancy, or IVDU.  Personally witnessed pt satting between 98-100% on RA on average throughout encounter.  This did not change when ambulating.  Well's score 0, low probability of pulmonary embolism.  Labs unremarkable.  Unlikely pneumonia, no cough, no leukocytosis, no fevers, CXR and exam without acute findings.  Unlikely pneumothorax, no findings on CXR.  Unlikely GERD, PUD, pericarditis/myocarditis, as does not fit clinical picture.  Chest pain not exertional.  Chest wall mildly tender on exam, without evidence of cellulitis, rash, edema, or warmth.  No evidence of pleural effusion or pulmonary edema on CXR.  Unlikely dissection, no pulse deficit, no tearing chest pain, no neurologic complaints.  EKG findings indicate without evidence of acute ischemic changes,  abnormal intervals, or dysrhythmia.  No concerning change from prior.  Troponins negative.  Hx of negative stress test 2010 per records.   Overall, I  am uncertain the exact etiology of the patient's symptoms.  However, I do not believe she is currently experiencing a medical, surgical, or psychiatric emergency.  Without any episodes of fleeting chest pain through duration of encounter.  Also admits to increased stress and lack of success with stress reduction/management.  Patient's clinical presentation is most consistent with precordial catch syndrome vs stress/anxiousness.  History, physical exam, labs, imaging, and presentation provide low suspicion of ACS at this time.  Though considering the risk factors present and duration since last cardiology evaluation, recommend close follow up with cardiology.  Pt hemodynamically stable.  Strict return precautions discussed.  Patient in NAD and in good condition at time of discharge.  Disposition: After consideration the patient's encounter today, I do not feel today's workup suggests an emergent condition requiring admission or immediate intervention beyond what has been performed at this time.  Safe for discharge; instructed to return immediately for worsening symptoms, change in symptoms or any other concerns.  I have reviewed the patients home medicines and have made adjustments as needed.  Discussed course of treatment with the patient, whom demonstrated understanding.  Patient in agreement and has no further questions.     This chart was dictated using voice recognition software.  Despite best efforts to proofread, errors can occur which can change the documentation meaning.         Final Clinical Impression(s) / ED Diagnoses Final diagnoses:  Nonspecific chest pain  Essential hypertension    Rx / DC Orders ED Discharge Orders          Ordered    Ambulatory referral to Cardiology       Comments: If you have not heard from the Cardiology  office within the next 72 hours please call [phone].   03/06/22 [place], Leed[place], PA-C 01/75/10 [place], Fenton, DO 03/15/22 [phone]

## 2022-03-06 NOTE — ED Notes (Signed)
Dr hagler spoke with patient and reports she is going to ED

## 2022-03-06 NOTE — ED Triage Notes (Signed)
Patient reports chest pain that started this am, described as pinching, intermittent. Denies nasuea/shortness of breath. States a couple days ago mild tingling feeling started to left side of neck as well as left shoulder pain. Shoulder pain associated with movement.

## 2022-03-06 NOTE — Discharge Instructions (Signed)
Your caregiver has diagnosed you as having chest pain that is nonspecific for one problem. This means that after looking at you and examining you and ordering tests (such as blood work, chest x-rays and EKG), your caregiver does not believe that the problem is serious enough to need watching in the hospital. This judgment is often made after testing shows no acute heart attack and you are at low risk for sudden acute heart condition. Chest pain comes from many different causes.  Seek immediate medical attention if:   You have severe chest pain, especially if the pain is crushing or pressure-like and spreads to the arms, back, neck, or jaw, or if you have sweating, nausea, shortness of breath. This is an emergency. Don't wait to see if the pain will go away. Get medical help at once. Call 911 immediately. Do not drive herself to the hospital.   Your chest pain gets worse and does not go away with rest.   You have an attack of chest pain lasting longer than usual, despite rest and treatment with the medications your caregiver has prescribed   You awaken from sleep with chest pain or shortness of breath.   You feel faint or dizzy   You have chest pain not typical of your usual pain for which you originally saw your caregiver.  You must have a repeat evaluation within 24 hours for a recheck of your heart.  Please call your doctor this morning to schedule this appointment. If you do not have a family doctor, please see the list of doctors below.  RESOURCE GUIDE  Dental Problems  Patients with Medicaid: [facility]                     [facility] [address]                                           [address] Phone:  [phone]                                                  Phone:  [phone]  If unable to pay or uninsured, contact:  [facility] or [facility] to become qualified for the adult dental clinic.  Chronic Pain  Problems Contact [facility]  [phone] Patients need to be referred by their primary care doctor.  Insufficient Money for Medicine Contact [employer]:  call "211" or [facility] 271-5999.  No Primary Care Doctor Call [facility]  [phone] Other agencies that provide inexpensive medical care    [facility]  [phone]    [facility]  [phone]    [facility]  [phone]    [facility] Clinic  [phone]    [facility]  [phone]    [facility]  [phone]  Psychological Services [facility]  [phone] [facility]  [phone] [facility]   800 853-5163 (emergency services [phone])  Substance Abuse Resources [facility]  336-882-2125 [facility] 336-784-9470 [facility] 336-285-9073 [facility] 336-845-3988 Residential & Outpatient Substance Abuse Program  800-659-3381  Abuse/Neglect [place] Child Abuse Hotline (336) 641-3795 [place] [facility] Abuse Hotline 800-378-5315 (After Hours)  Emergency Shelter [facility] (336) 271-5985  [facility] (336) 275-9566 [facility] (704) 372-4663  MRSA Hotline #:   832-7006    [place] [facility]  [facility] [place]     [employer]                          [facility] [address] [place]                       [address]      [address]  [place]                                                [place]                            [place] Phone:  [phone]                                   Phone:  [phone]                 Phone:  [phone]  [facility] Phone:  [phone]  [place] Child Abuse Hotline (336) 342-1394 (336) 342-3537 (After Hours)    

## 2022-03-06 NOTE — ED Notes (Signed)
Patient is being discharged from the Urgent Care and sent to the Emergency Department via POV . Per Dr Mannie Stabile, patient is in need of higher level of care due to limited resources with respect to patient complaint. Patient is aware and verbalizes understanding of plan of care.  Vitals:   03/06/22 1309  BP: (!) 134/58  Pulse: 81  Resp: 20  Temp: 98.4 F (36.9 C)  SpO2: 96%

## 2022-03-08 ENCOUNTER — Encounter: Payer: Self-pay | Admitting: Internal Medicine

## 2022-03-15 ENCOUNTER — Other Ambulatory Visit: Payer: Self-pay | Admitting: Internal Medicine

## 2022-03-16 LAB — FECAL OCCULT BLOOD, IMMUNOCHEMICAL: IFOBT: NEGATIVE

## 2022-03-19 ENCOUNTER — Ambulatory Visit: Payer: Medicare Other | Admitting: Sports Medicine

## 2022-03-25 NOTE — Progress Notes (Signed)
Rachel Blair D.[name] Phone: [phone]   Assessment and Plan:     1. Chronic pain of left knee 2. 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  If patient has flare before 6 months, we could consider long-acting corticosteroid such as Zilretta injection which would have added benefit of increasing patient's blood glucose less than catalogs CSI - Start HEP for knees  Pertinent previous records reviewed include none   Follow Up: - Discussed that HA injections could be repeated once every 6 months, so if patient has flare after 6 months, she should call back and request additional HA injection.  If patient has flare before 6 months, we could consider long-acting corticosteroid such as Zilretta injection     Subjective:   I, Rachel Blair, am serving as a Education administrator for Doctor Rachel Blair   Chief Complaint: gel injection    HPI:    02/19/22 Patient is here for gel injection.  Patient overall experienced 6 to 7 months relief from last CSI, however she has been active around her house and is felt a flare of knee pain.  Interested in additional injection today.  Denies new trauma, falls, numbness/tingling/weakness  03/26/2022 Patient states that it took a little bit to kick in , but it has helped , she still isnt able to get down on her knees     Relevant Historical Information: DM type II  Additional pertinent  review of systems negative.   Current Outpatient Medications:    ACCU-CHEK GUIDE test strip, USE TO TEST BLOOD SUGAR ONCE TO TWICE DAILY AS DIRECTED, Disp: 50 strip, Rfl: 1   Accu-Chek Softclix Lancets lancets, USE 2 LANCETS DAILY, Disp: 100 each, Rfl: 1   Blood Glucose Monitoring Suppl (ACCU-CHEK GUIDE) w/Device KIT, USE UP TO 1-2 TIMES DAILY AS DIRECTED, Disp: 1 kit, Rfl: 2   cycloSPORINE (RESTASIS) 0.05 % ophthalmic emulsion, Place 1 drop into both eyes 2 (two) times daily., Disp: , Rfl:    FARXIGA 5 MG TABS tablet, TAKE 1 TABLET(5 MG) BY MOUTH DAILY BEFORE BREAKFAST, Disp: 30 tablet, Rfl: 3   hydrochlorothiazide (HYDRODIURIL) 25 MG tablet, TAKE 1 TABLET(25 MG) BY MOUTH DAILY, Disp: 90 tablet, Rfl: 0   ketorolac (TORADOL) 10 MG tablet, Take 1 tablet (10 mg total) by mouth every 6 (six) hours as needed., Disp: 20 tablet, Rfl: 0   LORazepam (ATIVAN) 1 MG tablet, Take 1 tablet (1 mg total) by mouth every 8 (eight) hours as needed for anxiety., Disp: 30 tablet, Rfl: 0   metFORMIN (GLUCOPHAGE-XR) 500 MG 24 hr tablet, Take 500 mg by mouth daily., Disp: , Rfl:    rosuvastatin (CRESTOR) 5 MG tablet, TAKE 1 TABLET(5 MG) BY MOUTH DAILY, Disp: 90 tablet, Rfl: 1   zolpidem (AMBIEN) 10 MG tablet, TAKE 1 TABLET(10 MG) BY MOUTH AT BEDTIME AS NEEDED FOR SLEEP, Disp: 90 tablet, Rfl: 0   Objective:     Vitals:   03/26/22 0833  BP: 118/80  Pulse: 72  SpO2: 98%  Weight: 220 lb (99.8 kg)  Height: $Remove'5\' 5"'VYYYucr$  (1.651 m)      Body mass index is 36.61 kg/m.    Physical Exam:    General:  awake, alert oriented, no acute distress nontoxic Skin: no suspicious lesions or rashes Neuro:sensation intact, no deficits, strength 5/5 with no deficits, no atrophy, normal muscle tone Psych: No signs of anxiety, depression or other mood disorder  Left knee: No swelling No deformity Neg fluid wave, joint milking ROM Flex 100, Ext 5 TTP mildly medial joint line NTTP over the quad tendon, medial fem condyle, lat fem  condyle, patella, plica, patella tendon, tibial tuberostiy, fibular head, posterior fossa, pes anserine bursa, gerdy's tubercle,  lateral jt line   Neg lachman Neg sag sign Negative varus stress Negative valgus stress Negative McMurray Negative Thessaly  Gait normal    Electronically signed by:  Rachel Blair D.[name] 8:46 AM 03/26/22

## 2022-03-26 ENCOUNTER — Ambulatory Visit (INDEPENDENT_AMBULATORY_CARE_PROVIDER_SITE_OTHER): Payer: Medicare Other | Admitting: Sports Medicine

## 2022-03-26 ENCOUNTER — Other Ambulatory Visit: Payer: Self-pay | Admitting: Internal Medicine

## 2022-03-26 VITALS — BP 118/80 | HR 72 | Ht 65.0 in | Wt 220.0 lb

## 2022-03-26 DIAGNOSIS — E119 Type 2 diabetes mellitus without complications: Secondary | ICD-10-CM | POA: Insufficient documentation

## 2022-03-26 DIAGNOSIS — M25562 Pain in left knee: Secondary | ICD-10-CM

## 2022-03-26 DIAGNOSIS — M23207 Derangement of unspecified meniscus due to old tear or injury, left knee: Secondary | ICD-10-CM | POA: Diagnosis not present

## 2022-03-26 DIAGNOSIS — G8929 Other chronic pain: Secondary | ICD-10-CM

## 2022-03-26 NOTE — Patient Instructions (Addendum)
Good to see you  Knee HEP  As needed follow up  You can let us know if you would like a repeat HA injection if you get 6 months relief , and if you did not get 6 months relief let us know if you would like a long acting injection like zilretta

## 2022-03-30 ENCOUNTER — Encounter: Payer: Self-pay | Admitting: Internal Medicine

## 2022-04-02 NOTE — Progress Notes (Unsigned)
Cardiology Office Note:    Date:  04/02/2022   ID:  Rachel Blair, DOB 1951/04/08, MRN [id number]  PCP:  Madelin Headings, MD   [facility] Providers Cardiologist:  None   Referring MD: Cecil Cobbs, PA*    History of Present Illness:    Rachel Blair is a 71 y.o. female with a hx of HTN, OSA, GERD and allergies who was referred by Kathie Dike for further evaluation of chest pain.  Patient was seen in [facility] ER 03/06/22. Note reviewed. Presented with episodic chest pain which was intermittent and pinching in nature. Each episode lasted a few seconds before resolving. In the ER, trop negative x2. ECG with no acute ischemic changes. CXR unremarkable. She was discharged home with CV follow-up.  Today, ***  Past Medical History:  Diagnosis Date   Allergy    Arthritis    CHEST DISCOMFORT 08/30/2007   Qualifier: Diagnosis of  By: Fabian Sharp MD, Neta Mends    Chest pain    atypical   History of pneumonia    Hypertension    Nasal septal deviation    OBSTRUCTIVE SLEEP APNEA 08/30/2007   Qualifier: Diagnosis of  By: Fabian Sharp MD, Neta Mends    Odontogenic cyst    of left maxillary sinus   OSA (obstructive sleep apnea)    moderatley severe, uses CPAP nightly   Trigger thumb of left hand     Past Surgical History:  Procedure Laterality Date   biopsy and bone graft of left maxillary sinus odontogenic cyst     CHOLECYSTECTOMY     left thumb trigger finger release Right    TONSILLECTOMY     TRIGGER FINGER RELEASE Left 05/27/2015   Procedure: RELEASE TRIGGER FINGER/A-1 PULLEY LEFT THUMB;  Surgeon: Cindee Salt, MD;  Location: [facility];  Service: Orthopedics;  Laterality: Left;   TUBAL LIGATION      Current Medications: No outpatient medications have been marked as taking for the 04/05/22 encounter (Appointment) with Meriam Sprague, MD.     Allergies:   Amphetamine-dextroamphet er, Losartan, Duloxetine, Hydrocodone-acetaminophen, Metformin and related,  and Morphine sulfate   Social History   Socioeconomic History   Marital status: Married    Spouse name: Not on file   Number of children: 3   Years of education: Not on file   Highest education level: Not on file  Occupational History   Occupation: retired  Tobacco Use   Smoking status: Former    Packs/day: 1.00    Years: 30.00    Total pack years: 30.00    Types: Cigarettes    Quit date: 06/14/1998    Years since quitting: 23.8   Smokeless tobacco: Never  Vaping Use   Vaping Use: Never used  Substance and Sexual Activity   Alcohol use: Yes    Comment: once a month   Drug use: No   Sexual activity: Yes    Birth control/protection: None  Other Topics Concern   Not on file  Social History Narrative   Married   Regular exercise   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     Ex smoker  Stopped 2000   G3P3   2 cats and a Nurse, mental health          Social Determinants of Corporate investment banker Strain: Low Risk  (  12/01/2021)   Overall Financial Resource Strain (CARDIA)    Difficulty of Paying Living Expenses: Not hard at all  Food Insecurity: No Food Insecurity (12/01/2021)   Hunger Vital Sign    Worried About Running Out of Food in the Last Year: Never true    Ran Out of Food in the Last Year: Never true  Transportation Needs: No Transportation Needs (12/01/2021)   PRAPARE - Administrator, Civil Service (Medical): No    Lack of Transportation (Non-Medical): No  Physical Activity: Inactive (12/01/2021)   Exercise Vital Sign    Days of Exercise per Week: 0 days    Minutes of Exercise per Session: 0 min  Stress: Stress Concern Present (12/01/2021)   [employer] of Occupational Health - Occupational Stress Questionnaire    Feeling of Stress : To some extent  Social Connections: Moderately Integrated (11/25/2020)   Social Connection and Isolation Panel [NHANES]     Frequency of Communication with Friends and Family: More than three times a week    Frequency of Social Gatherings with Friends and Family: More than three times a week    Attends Religious Services: More than 4 times per year    Active Member of [employer] or Organizations: No    Attends Engineer, structural: Never    Marital Status: Married     Family History: The patient's ***family history includes Breast cancer in her paternal aunt; Emphysema in her father; Heart disease in her mother; Heart failure in her father; Lung cancer in her brother; Rheum arthritis in her daughter; Snoring in her brother; Stroke in her father.  ROS:   Please see the history of present illness.    *** All other systems reviewed and are negative.  EKGs/Labs/Other Studies Reviewed:    The following studies were reviewed today: ***  EKG:  EKG is *** ordered today.  The ekg ordered today demonstrates ***  Recent Labs: 03/06/2022: ALT 29; BUN 16; Creatinine, Ser 1.10; Hemoglobin 15.5; Platelets 203; Potassium 3.3; Sodium 139  Recent Lipid Panel    Component Value Date/Time   CHOL 117 02/17/2022 0747   TRIG 203.0 (H) 02/17/2022 0747   HDL 44.70 02/17/2022 0747   CHOLHDL 3 02/17/2022 0747   VLDL 40.6 (H) 02/17/2022 0747   LDLCALC 41 06/30/2021 0831   LDLDIRECT 52.0 02/17/2022 0747     Risk Assessment/Calculations:   {Does this patient have ATRIAL FIBRILLATION?:[id number]}  No BP recorded.  {Refresh Note OR Click here to enter BP  :1}***         Physical Exam:    VS:  There were no vitals taken for this visit.    Wt Readings from Last 3 Encounters:  03/26/22 220 lb (99.8 kg)  02/16/22 230 lb (104.3 kg)  12/01/21 230 lb (104.3 kg)     GEN: *** Well nourished, well developed in no acute distress HEENT: Normal NECK: No JVD; No carotid bruits LYMPHATICS: No lymphadenopathy CARDIAC: ***RRR, no murmurs, rubs, gallops RESPIRATORY:  Clear to auscultation without rales, wheezing or rhonchi   ABDOMEN: Soft, non-tender, non-distended MUSCULOSKELETAL:  No edema; No deformity  SKIN: Warm and dry NEUROLOGIC:  Alert and oriented x 3 PSYCHIATRIC:  Normal affect   ASSESSMENT:    No diagnosis found. PLAN:    In order of problems listed above:  #Chest Pain: Reassuring work-up in the ER where trop negativex2, ECG nonischemic and CXR without acute pathology. Currently, *** -Check coronary CTA -Check TTE  #HTN: -Continue HCTZ 25mg   daily  #DMII: -Continue farxiga and metformin  #HLD: -Continue crestor 5mg  daily      {Are you ordering a CV Procedure (e.g. stress test, cath, DCCV, TEE, etc)?   Press F2        :[id number]}    Medication Adjustments/Labs and Tests Ordered: Current medicines are reviewed at length with the patient today.  Concerns regarding medicines are outlined above.  No orders of the defined types were placed in this encounter.  No orders of the defined types were placed in this encounter.   There are no Patient Instructions on file for this visit.   Signed, Meriam Sprague, MD  04/02/2022 8:37 AM    [facility]

## 2022-04-05 ENCOUNTER — Encounter: Payer: Self-pay | Admitting: Cardiology

## 2022-04-05 ENCOUNTER — Telehealth: Payer: Self-pay | Admitting: *Deleted

## 2022-04-05 ENCOUNTER — Ambulatory Visit: Payer: Medicare Other | Attending: Cardiology | Admitting: Cardiology

## 2022-04-05 VITALS — BP 128/78 | HR 68 | Ht 65.0 in | Wt 221.2 lb

## 2022-04-05 DIAGNOSIS — I10 Essential (primary) hypertension: Secondary | ICD-10-CM | POA: Diagnosis not present

## 2022-04-05 DIAGNOSIS — E7800 Pure hypercholesterolemia, unspecified: Secondary | ICD-10-CM

## 2022-04-05 DIAGNOSIS — R072 Precordial pain: Secondary | ICD-10-CM

## 2022-04-05 DIAGNOSIS — E119 Type 2 diabetes mellitus without complications: Secondary | ICD-10-CM

## 2022-04-05 LAB — BASIC METABOLIC PANEL
BUN/Creatinine Ratio: 14 (ref 12–28)
BUN: 12 mg/dL (ref 8–27)
CO2: 27 mmol/L (ref 20–29)
Calcium: 9.3 mg/dL (ref 8.7–10.3)
Chloride: 102 mmol/L (ref 96–106)
Creatinine, Ser: 0.86 mg/dL (ref 0.57–1.00)
Glucose: 123 mg/dL — ABNORMAL HIGH (ref 70–99)
Potassium: 3.8 mmol/L (ref 3.5–5.2)
Sodium: 141 mmol/L (ref 134–144)
eGFR: 73 mL/min/{1.73_m2} (ref >59)

## 2022-04-05 MED ORDER — METOPROLOL TARTRATE 100 MG PO TABS: 100.0000 mg | ORAL_TABLET | Freq: Once | ORAL | 0 refills | Status: DC

## 2022-04-05 NOTE — Progress Notes (Signed)
Cardiology Office Note:    Date:  04/05/2022   ID:  Rachel Blair, DOB Sep 28, 1950, MRN [id number]  PCP:  Madelin Headings, MD   [facility] Providers Cardiologist:  None   Referring MD: Cecil Cobbs, PA*    History of Present Illness:    Rachel Blair is a 71 y.o. female with a hx of HTN, OSA, GERD and allergies who was referred by Rachel Blair for further evaluation of chest pain.  Patient was seen in [facility] ER 03/06/22. Note reviewed. Presented with episodic chest pain which was intermittent and pinching in nature. Each episode lasted a few seconds before resolving. In the ER, trop negative x2. ECG with no acute ischemic changes. CXR unremarkable. She was discharged home with CV follow-up.  Today, she reports that she had some intermittent chest pinching in the center of her chest one morning prior to her urgent care and ED visits. No associated SOB, palpitations, LE edema or orthopnea. She states that this only occurred one time. Nothing abnormal was found at either the urgent care or ED. Her symptoms resolved while at the urgent care.   For exercise, she usually works in her yard, and she feels pretty good during this. She states that sometimes she has to stop because of knee pain, but not because of any chest or breathing discomfort.   She denies any palpitations, shortness of breath, or peripheral edema. No lightheadedness, headaches, syncope, orthopnea, or PND. Blood pressure is well controlled. A1C 6.5.  Past Medical History:  Diagnosis Date   Allergy    Arthritis    CHEST DISCOMFORT 08/30/2007   Qualifier: Diagnosis of  By: Fabian Sharp MD, Neta Mends    Chest pain    atypical   History of pneumonia    Hypertension    Nasal septal deviation    OBSTRUCTIVE SLEEP APNEA 08/30/2007   Qualifier: Diagnosis of  By: Fabian Sharp MD, Neta Mends    Odontogenic cyst    of left maxillary sinus   OSA (obstructive sleep apnea)    moderatley severe, uses CPAP nightly   Trigger thumb  of left hand     Past Surgical History:  Procedure Laterality Date   biopsy and bone graft of left maxillary sinus odontogenic cyst     CHOLECYSTECTOMY     left thumb trigger finger release Right    TONSILLECTOMY     TRIGGER FINGER RELEASE Left 05/27/2015   Procedure: RELEASE TRIGGER FINGER/A-1 PULLEY LEFT THUMB;  Surgeon: Cindee Salt, MD;  Location: [facility];  Service: Orthopedics;  Laterality: Left;   TUBAL LIGATION      Current Medications: Current Meds  Medication Sig   ACCU-CHEK GUIDE test strip USE TO TEST BLOOD SUGAR ONCE TO TWICE DAILY AS DIRECTED   Accu-Chek Softclix Lancets lancets USE 2 LANCETS DAILY   Blood Glucose Monitoring Suppl (ACCU-CHEK GUIDE) w/Device KIT USE UP TO 1-2 TIMES DAILY AS DIRECTED   cycloSPORINE (RESTASIS) 0.05 % ophthalmic emulsion Place 1 drop into both eyes 2 (two) times daily.   FARXIGA 5 MG TABS tablet TAKE 1 TABLET(5 MG) BY MOUTH DAILY BEFORE BREAKFAST   hydrochlorothiazide (HYDRODIURIL) 25 MG tablet TAKE 1 TABLET(25 MG) BY MOUTH DAILY   LORazepam (ATIVAN) 1 MG tablet Take 1 tablet (1 mg total) by mouth every 8 (eight) hours as needed for anxiety.   metoprolol tartrate (LOPRESSOR) 100 MG tablet Take 1 tablet (100 mg total) by mouth once for 1 dose. Take 90-120 minutes prior  to scan.   rosuvastatin (CRESTOR) 5 MG tablet TAKE 1 TABLET(5 MG) BY MOUTH DAILY   zolpidem (AMBIEN) 10 MG tablet TAKE 1 TABLET(10 MG) BY MOUTH AT BEDTIME AS NEEDED FOR SLEEP   [DISCONTINUED] ketorolac (TORADOL) 10 MG tablet Take 1 tablet (10 mg total) by mouth every 6 (six) hours as needed.   [DISCONTINUED] metFORMIN (GLUCOPHAGE-XR) 500 MG 24 hr tablet Take 500 mg by mouth daily.     Allergies:   Amphetamine-dextroamphet er, Losartan, Duloxetine, Hydrocodone-acetaminophen, Metformin and related, and Morphine sulfate   Social History   Socioeconomic History   Marital status: Married    Spouse name: Not on file   Number of children: 3   Years of education:  Not on file   Highest education level: Not on file  Occupational History   Occupation: retired  Tobacco Use   Smoking status: Former    Packs/day: 1.00    Years: 30.00    Total pack years: 30.00    Types: Cigarettes    Quit date: 06/14/1998    Years since quitting: 23.8   Smokeless tobacco: Never  Vaping Use   Vaping Use: Never used  Substance and Sexual Activity   Alcohol use: Yes    Comment: once a month   Drug use: No   Sexual activity: Yes    Birth control/protection: None  Other Topics Concern   Not on file  Social History Narrative   Married   Regular exercise   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     Ex smoker  Stopped 2000   G3P3   2 cats and a dog          Social Determinants of Corporate investment banker Strain: Low Risk  (12/01/2021)   Overall Financial Resource Strain (CARDIA)    Difficulty of Paying Living Expenses: Not hard at all  Food Insecurity: No Food Insecurity (12/01/2021)   Hunger Vital Sign    Worried About Running Out of Food in the Last Year: Never true    Ran Out of Food in the Last Year: Never true  Transportation Needs: No Transportation Needs (12/01/2021)   PRAPARE - Administrator, Civil Service (Medical): No    Lack of Transportation (Non-Medical): No  Physical Activity: Inactive (12/01/2021)   Exercise Vital Sign    Days of Exercise per Week: 0 days    Minutes of Exercise per Session: 0 min  Stress: Stress Concern Present (12/01/2021)   [employer] of Occupational Health - Occupational Stress Questionnaire    Feeling of Stress : To some extent  Social Connections: Moderately Integrated (11/25/2020)   Social Connection and Isolation Panel [NHANES]    Frequency of Communication with Friends and Family: More than three times a week    Frequency of Social Gatherings with Friends and Family: More than three times a week     Attends Religious Services: More than 4 times per year    Active Member of [employer] or Organizations: No    Attends Engineer, structural: Never    Marital Status: Married     Family History: The patient's family history includes Breast cancer in her paternal aunt; Emphysema in her father; Heart disease in her mother; Heart failure in her father; Lung cancer in her brother; Rheum arthritis in her daughter; Snoring in her brother; Stroke  in her father.  ROS:   Please see the history of present illness.    All other systems reviewed and are negative.  EKGs/Labs/Other Studies Reviewed:    The following studies were reviewed today:  LLE Venous Doppler 05/15/2019: Summary:  Right: No evidence of common femoral vein obstruction.   Left: No evidence of deep vein thrombosis in the lower extremity.  No indirect evidence of obstruction proximal to the inguinal ligament.  No cystic structure found in the popliteal fossa.    EKG:  EKG is personally reviewed.   04/05/22: EKG was not ordered.  Recent Labs: 03/06/2022: ALT 29; BUN 16; Creatinine, Ser 1.10; Hemoglobin 15.5; Platelets 203; Potassium 3.3; Sodium 139  Recent Lipid Panel    Component Value Date/Time   CHOL 117 02/17/2022 0747   TRIG 203.0 (H) 02/17/2022 0747   HDL 44.70 02/17/2022 0747   CHOLHDL 3 02/17/2022 0747   VLDL 40.6 (H) 02/17/2022 0747   LDLCALC 41 06/30/2021 0831   LDLDIRECT 52.0 02/17/2022 0747     Risk Assessment/Calculations:                Physical Exam:    VS:  BP 128/78 (BP Location: Left Arm, Patient Position: Sitting, Cuff Size: Large)   Pulse 68   Ht 5\' 5"  (1.651 m)   Wt 221 lb 3.2 oz (100.3 kg)   SpO2 97%   BMI 36.81 kg/m     Wt Readings from Last 3 Encounters:  04/05/22 221 lb 3.2 oz (100.3 kg)  03/26/22 220 lb (99.8 kg)  02/16/22 230 lb (104.3 kg)     GEN:  Well nourished, well developed in no acute distress HEENT: Normal NECK: No JVD; No carotid bruits CARDIAC: RRR, 1/6  systolic murmur, rubs, gallops RESPIRATORY:  Clear to auscultation without rales, wheezing or rhonchi  ABDOMEN: Soft, non-tender, non-distended MUSCULOSKELETAL:  No edema; No deformity  SKIN: Warm and dry NEUROLOGIC:  Alert and oriented x 3 PSYCHIATRIC:  Normal affect   ASSESSMENT:    1. Precordial pain   2. Essential hypertension   3. Diabetes mellitus with coincident hypertension (HCC)   4. Pure hypercholesterolemia     PLAN:    In order of problems listed above:  #Chest Pain: Reassuring work-up in the ER where trop negativex2, ECG nonischemic and CXR without acute pathology. No recurrence of symptoms. Overall atypical in nature, however, given risk factors including age, DM, HLD and HTN, will check coronary CTA and TTE for further evaluation. -Check coronary CTA -Check TTE  #HTN: Well controlled and at goal <130/90. -Continue HCTZ 25mg  daily  #DMII: -Continue farxiga  -Management per PCP  #HLD: -Continue crestor 5mg  daily -LDL at goal 41 (06/2021)         Follow up: 1 year.  Medication Adjustments/Labs and Tests Ordered: Current medicines are reviewed at length with the patient today.  Concerns regarding medicines are outlined above.  Orders Placed This Encounter  Procedures   CT CORONARY MORPH W/CTA COR W/SCORE W/CA W/CM &/OR WO/CM   Basic metabolic panel   ECHOCARDIOGRAM COMPLETE   Meds ordered this encounter  Medications   metoprolol tartrate (LOPRESSOR) 100 MG tablet    Sig: Take 1 tablet (100 mg total) by mouth once for 1 dose. Take 90-120 minutes prior to scan.    Dispense:  1 tablet    Refill:  0    Patient Instructions  Medication Instructions:   Your physician recommends that you continue on your current medications as directed. Please  refer to the Current Medication list given to you today.  *If you need a refill on your cardiac medications before your next appointment, please call your pharmacy*   Lab Work:  TODAY--BMET  If you have  labs (blood work) drawn today and your tests are completely normal, you will receive your results only by: MyChart Message (if you have MyChart) OR A paper copy in the mail If you have any lab test that is abnormal or we need to change your treatment, we will call you to review the results.   Testing/Procedures:  Your physician has requested that you have an echocardiogram. Echocardiography is a painless test that uses sound waves to create images of your heart. It provides your doctor with information about the size and shape of your heart and how well your heart's chambers and valves are working. This procedure takes approximately one hour. There are no restrictions for this procedure. Please do NOT wear cologne, perfume, aftershave, or lotions (deodorant is allowed). Please arrive 15 minutes prior to your appointment time.      Your cardiac CT will be scheduled at one of the below locations:   [facility] [address] [place], [place] [address] [phone]  If scheduled at [facility], please arrive at the [facility] and Children's Entrance (Entrance C2) of [facility] 30 minutes prior to test start time. You can use the FREE valet parking offered at entrance C (encouraged to control the heart rate for the test)  Proceed to the [facility] Radiology Department (first floor) to check-in and test prep.  All radiology patients and guests should use entrance C2 at [facility], accessed from [facility], even though the hospital's physical address listed is [address].     Please follow these instructions carefully (unless otherwise directed):   On the Night Before the Test: Be sure to Drink plenty of water. Do not consume any caffeinated/decaffeinated beverages or chocolate 12 hours prior to your test. Do not take any antihistamines 12 hours prior to your test.   On the Day of the Test: Drink plenty of water until 1  hour prior to the test. Do not eat any food 1 hour prior to test. You may take your regular medications prior to the test.  Take metoprolol 100 MG BY MOUTH (Lopressor) two hours prior to test. HOLD Hydrochlorothiazide morning of the test. FEMALES- please wear underwire-free bra if available, avoid dresses & tight clothing        After the Test: Drink plenty of water. After receiving IV contrast, you may experience a mild flushed feeling. This is normal. On occasion, you may experience a mild rash up to 24 hours after the test. This is not dangerous. If this occurs, you can take Benadryl 25 mg and increase your fluid intake. If you experience trouble breathing, this can be serious. If it is severe call 911 IMMEDIATELY. If it is mild, please call our office. If you take any of these medications: Glipizide/Metformin, Avandament, Glucavance, please do not take 48 hours after completing test unless otherwise instructed.  We will call to schedule your test 2-4 weeks out understanding that some insurance companies will need an authorization prior to the service being performed.   For non-scheduling related questions, please contact the cardiac imaging nurse navigator should you have any questions/concerns: Rockwell Alexandria, Cardiac Imaging Nurse Navigator Larey Brick, Cardiac Imaging Nurse Navigator [facility] Direct Office Dial:  [phone]   For scheduling needs, including cancellations and rescheduling, please call [place], [phone].    Follow-Up: At [facility], you and your health needs are our priority.  As part of our continuing mission to provide you with exceptional heart care, we have created designated Provider Care Teams.  These Care Teams include your primary Cardiologist (physician) and Advanced Practice Providers (APPs -  Physician Assistants and Nurse Practitioners) who all work together to provide you with the care you need, when you  need it.  We recommend signing up for the patient portal called "MyChart".  Sign up information is provided on this After Visit Summary.  MyChart is used to connect with patients for Virtual Visits (Telemedicine).  Patients are able to view lab/test results, encounter notes, upcoming appointments, etc.  Non-urgent messages can be sent to your provider as well.   To learn more about what you can do with MyChart, go to [email or web address].    Your next appointment:   1 year(s)  The format for your next appointment:   In Person  Provider:   DR. Shari Prows  Important Information About Sugar          I,Breanna Adamick,acting as a scribe for Meriam Sprague, MD.,have documented all relevant documentation on the behalf of Meriam Sprague, MD,as directed by  Meriam Sprague, MD while in the presence of Meriam Sprague, MD.  I, Meriam Sprague, MD, have reviewed all documentation for this visit. The documentation on 04/05/22 for the exam, diagnosis, procedures, and orders are all accurate and complete.   Signed, Meriam Sprague, MD  04/05/2022 8:54 AM    [facility]

## 2022-04-05 NOTE — Telephone Encounter (Signed)
-----   Message from Melony Overly sent at 04/05/2022 10:56 AM EDT ----- Regarding: ct heart Scheduled 11/2 at 1:30

## 2022-04-05 NOTE — Patient Instructions (Signed)
Medication Instructions:   Your physician recommends that you continue on your current medications as directed. Please refer to the Current Medication list given to you today.  *If you need a refill on your cardiac medications before your next appointment, please call your pharmacy*   Lab Work:  TODAY--BMET  If you have labs (blood work) drawn today and your tests are completely normal, you will receive your results only by: [place] (if you have MyChart) OR A paper copy in the mail If you have any lab test that is abnormal or we need to change your treatment, we will call you to review the results.   Testing/Procedures:  Your physician has requested that you have an echocardiogram. Echocardiography is a painless test that uses sound waves to create images of your heart. It provides your doctor with information about the size and shape of your heart and how well your heart's chambers and valves are working. This procedure takes approximately one hour. There are no restrictions for this procedure. Please do NOT wear cologne, perfume, aftershave, or lotions (deodorant is allowed). Please arrive 15 minutes prior to your appointment time.      Your cardiac CT will be scheduled at one of the below locations:   [facility] [address] [place], [place] [address] [phone]  If scheduled at [facility], please arrive at the [facility] and Children's Entrance (Entrance C2) of [facility] 30 minutes prior to test start time. You can use the FREE valet parking offered at entrance C (encouraged to control the heart rate for the test)  Proceed to the [facility] Radiology Department (first floor) to check-in and test prep.  All radiology patients and guests should use entrance C2 at [facility], accessed from [facility], even though the hospital's physical address listed is [address].     Please follow these  instructions carefully (unless otherwise directed):   On the Night Before the Test: Be sure to Drink plenty of water. Do not consume any caffeinated/decaffeinated beverages or chocolate 12 hours prior to your test. Do not take any antihistamines 12 hours prior to your test.   On the Day of the Test: Drink plenty of water until 1 hour prior to the test. Do not eat any food 1 hour prior to test. You may take your regular medications prior to the test.  Take metoprolol 100 MG BY MOUTH (Lopressor) two hours prior to test. HOLD Hydrochlorothiazide morning of the test. FEMALES- please wear underwire-free bra if available, avoid dresses & tight clothing        After the Test: Drink plenty of water. After receiving IV contrast, you may experience a mild flushed feeling. This is normal. On occasion, you may experience a mild rash up to 24 hours after the test. This is not dangerous. If this occurs, you can take Benadryl 25 mg and increase your fluid intake. If you experience trouble breathing, this can be serious. If it is severe call 911 IMMEDIATELY. If it is mild, please call our office. If you take any of these medications: Glipizide/Metformin, Avandament, Glucavance, please do not take 48 hours after completing test unless otherwise instructed.  We will call to schedule your test 2-4 weeks out understanding that some insurance companies will need an authorization prior to the service being performed.   For non-scheduling related questions, please contact the cardiac imaging nurse navigator should you have any questions/concerns: Marchia Bond, Cardiac Imaging Nurse  Navigator Larey Brick, Cardiac Imaging Nurse Navigator [facility] Direct Office Dial: [phone]   For scheduling needs, including cancellations and rescheduling, please call [place], [phone].    Follow-Up: At [facility], you and your health needs are our priority.  As part  of our continuing mission to provide you with exceptional heart care, we have created designated Provider Care Teams.  These Care Teams include your primary Cardiologist (physician) and Advanced Practice Providers (APPs -  Physician Assistants and Nurse Practitioners) who all work together to provide you with the care you need, when you need it.  We recommend signing up for the patient portal called "MyChart".  Sign up information is provided on this After Visit Summary.  MyChart is used to connect with patients for Virtual Visits (Telemedicine).  Patients are able to view lab/test results, encounter notes, upcoming appointments, etc.  Non-urgent messages can be sent to your provider as well.   To learn more about what you can do with MyChart, go to [email or web address].    Your next appointment:   1 year(s)  The format for your next appointment:   In Person  Provider:   DR. Shari Prows  Important Information About Sugar

## 2022-04-14 ENCOUNTER — Telehealth (HOSPITAL_COMMUNITY): Payer: Self-pay | Admitting: *Deleted

## 2022-04-14 NOTE — Telephone Encounter (Signed)
Attempted to call patient regarding upcoming cardiac CT appointment. °Left message on voicemail with name and callback number ° °Jerimah Witucki RN Navigator Cardiac Imaging °[facility] °[phone] Office °[phone] Cell ° °

## 2022-04-15 ENCOUNTER — Ambulatory Visit (HOSPITAL_COMMUNITY)
Admission: RE | Admit: 2022-04-15 | Discharge: 2022-04-15 | Disposition: A | Discharge status: Home / Self Care | Payer: Medicare Other | Source: Ambulatory Visit | Attending: Cardiology | Admitting: Cardiology

## 2022-04-15 ENCOUNTER — Telehealth: Payer: Self-pay | Admitting: *Deleted

## 2022-04-15 ENCOUNTER — Other Ambulatory Visit: Payer: Self-pay | Admitting: Cardiology

## 2022-04-15 DIAGNOSIS — R072 Precordial pain: Secondary | ICD-10-CM | POA: Diagnosis present

## 2022-04-15 DIAGNOSIS — E119 Type 2 diabetes mellitus without complications: Secondary | ICD-10-CM

## 2022-04-15 DIAGNOSIS — E7800 Pure hypercholesterolemia, unspecified: Secondary | ICD-10-CM

## 2022-04-15 DIAGNOSIS — I10 Essential (primary) hypertension: Secondary | ICD-10-CM

## 2022-04-15 MED ORDER — NITROGLYCERIN 0.4 MG SL SUBL
0.8000 mg | SUBLINGUAL_TABLET | SUBLINGUAL | Status: DC | PRN
  Administered 2022-04-15: 0.8 mg via SUBLINGUAL

## 2022-04-15 MED ORDER — NITROGLYCERIN 0.4 MG SL SUBL
SUBLINGUAL_TABLET | SUBLINGUAL | Status: AC
  Filled 2022-04-15: qty 2

## 2022-04-15 NOTE — Progress Notes (Signed)
Patient ID: Rachel Blair, female   DOB: Jul 17, 1950, 71 y.o.   MRN: [id number]  Pt s/p CT this AM with subsequent extravasation of saline/contrast (approx 400 mL) into right antecubital venous acess site.  Area currently soft but edematous, NT, intact distal pulses and neuro fxn ok.  Recommend cool compress (can alternate with warm compresses as needed) to site and arm elevation as much as possible over next 24 hrs to promote adequate venous drainage.  Mark margins of affected area with pen. RN made aware.  Will follow-up tomorrow.   Brynda Greathouse, MS RD PA-C

## 2022-04-15 NOTE — Telephone Encounter (Signed)
Message Received: Today Eli Hose, RN  Freada Bergeron, MD; Nuala Alpha, LPN; Lorenza Evangelist, RN; Melony Overly Hi Dr. Johney Frame,  This CCTA was changed to a calcium score because the IV infiltrated and the patient did not want to be stuck again.  Thanks, Kerin Ransom

## 2022-04-19 ENCOUNTER — Other Ambulatory Visit: Payer: Self-pay | Admitting: Internal Medicine

## 2022-04-19 ENCOUNTER — Ambulatory Visit (HOSPITAL_COMMUNITY): Payer: Medicare Other | Attending: Internal Medicine

## 2022-04-19 DIAGNOSIS — R072 Precordial pain: Secondary | ICD-10-CM | POA: Diagnosis present

## 2022-04-19 LAB — ECHOCARDIOGRAM COMPLETE
Area-P 1/2: 3.54 cm2
S' Lateral: 2.6 cm

## 2022-04-19 NOTE — Telephone Encounter (Signed)
Message Received: 3 days ago Lorenza Evangelist, RN  Freada Bergeron, MD; Eli Hose, RN; Nuala Alpha, LPN; Emeline Darling, Estill Bamberg Thank you for the update Have a good weekend! Clarise Cruz       Previous Messages    ----- Message ----- From: Freada Bergeron, MD Sent: 04/16/2022   1:50 PM EDT To: Ciro Backer; Nuala Alpha, LPN; *  She declined wanting a repeat CTA for now. Will let you know if she changes her mind. ----- Message ----- From: Lorenza Evangelist, RN Sent: 04/15/2022   4:20 PM EDT To: Ciro Backer; Nuala Alpha, LPN; *  Please let us know if you would like for [place] to try again (with patients permission). Would need a new CCTA order / auth.  Thank you, Marchia Bond ----- Message ----- From: Eli Hose, RN Sent: 04/15/2022   4:07 PM EDT To: Nuala Alpha, LPN; Lorenza Evangelist, RN; *  Hi Dr. Johney Frame,  This CCTA was changed to a calcium score because the IV infiltrated and the patient did not want to be stuck again.  Thanks, Kerin Ransom

## 2022-04-20 ENCOUNTER — Other Ambulatory Visit: Payer: Self-pay | Admitting: Internal Medicine

## 2022-04-21 MED ORDER — HYDROCHLOROTHIAZIDE 25 MG PO TABS: 25.0000 mg | ORAL_TABLET | Freq: Every day | ORAL | 0 refills | Status: DC

## 2022-04-21 MED ORDER — ZOLPIDEM TARTRATE 10 MG PO TABS: ORAL_TABLET | ORAL | 0 refills | Status: DC

## 2022-06-13 ENCOUNTER — Other Ambulatory Visit: Payer: Self-pay | Admitting: Internal Medicine

## 2022-06-15 ENCOUNTER — Other Ambulatory Visit: Payer: Self-pay | Admitting: Internal Medicine

## 2022-06-16 ENCOUNTER — Other Ambulatory Visit: Payer: Self-pay | Admitting: Internal Medicine

## 2022-06-16 NOTE — Telephone Encounter (Addendum)
Last VV-02/16/22 Last refill-04/21/22--90 tabs, 0 refills  No future OV scheduled.

## 2022-07-12 NOTE — Progress Notes (Unsigned)
ACUTE VISIT No chief complaint on file.  HPI: Rachel Blair is a 72 y.o. female, who is here today complaining of *** HPI  Review of Systems See other pertinent positives and negatives in HPI.  Current Outpatient Medications on File Prior to Visit  Medication Sig Dispense Refill   ACCU-CHEK GUIDE test strip USE TO TEST BLOOD SUGAR ONCE TO TWICE DAILY AS DIRECTED 50 strip 1   Accu-Chek Softclix Lancets lancets USE 2 LANCETS DAILY 100 each 0   Blood Glucose Monitoring Suppl (ACCU-CHEK GUIDE) w/Device KIT USE UP TO 1-2 TIMES DAILY AS DIRECTED 1 kit 2   cycloSPORINE (RESTASIS) 0.05 % ophthalmic emulsion Place 1 drop into both eyes 2 (two) times daily.     FARXIGA 5 MG TABS tablet TAKE 1 TABLET(5 MG) BY MOUTH DAILY BEFORE BREAKFAST 30 tablet 3   hydrochlorothiazide (HYDRODIURIL) 25 MG tablet Take 1 tablet (25 mg total) by mouth daily. 90 tablet 0   LORazepam (ATIVAN) 1 MG tablet Take 1 tablet (1 mg total) by mouth every 8 (eight) hours as needed for anxiety. 30 tablet 0   metoprolol tartrate (LOPRESSOR) 100 MG tablet Take 1 tablet (100 mg total) by mouth once for 1 dose. Take 90-120 minutes prior to scan. 1 tablet 0   rosuvastatin (CRESTOR) 5 MG tablet TAKE 1 TABLET(5 MG) BY MOUTH DAILY 90 tablet 1   zolpidem (AMBIEN) 10 MG tablet TAKE 1 TABLET(10 MG) BY MOUTH AT BEDTIME AS NEEDED FOR SLEEP 90 tablet 0   No current facility-administered medications on file prior to visit.    Past Medical History:  Diagnosis Date   Allergy    Arthritis    CHEST DISCOMFORT 08/30/2007   Qualifier: Diagnosis of  By: Regis Bill MD, Standley Brooking    Chest pain    atypical   History of pneumonia    Hypertension    Nasal septal deviation    OBSTRUCTIVE SLEEP APNEA 08/30/2007   Qualifier: Diagnosis of  By: Regis Bill MD, Standley Brooking    Odontogenic cyst    of left maxillary sinus   OSA (obstructive sleep apnea)    moderatley severe, uses CPAP nightly   Trigger thumb of left hand    Allergies  Allergen Reactions    Amphetamine-Dextroamphet Er Other (See Comments)    REACTION: Coming out of skin   Losartan Other (See Comments)    Chest pain   Duloxetine Nausea Only    REACTION: Sleepy and nausea   Hydrocodone-Acetaminophen Nausea Only    REACTION: abdominal pain   Metformin And Related Other (See Comments)    Headache   Morphine Sulfate Other (See Comments)    REACTION: abdominal pain    Social History   Socioeconomic History   Marital status: Married    Spouse name: Not on file   Number of children: 3   Years of education: Not on file   Highest education level: Not on file  Occupational History   Occupation: retired  Tobacco Use   Smoking status: Former    Packs/day: 1.00    Years: 30.00    Total pack years: 30.00    Types: Cigarettes    Quit date: 06/14/1998    Years since quitting: 24.0   Smokeless tobacco: Never  Vaping Use   Vaping Use: Never used  Substance and Sexual Activity   Alcohol use: Yes    Comment: once a month   Drug use: No   Sexual activity: Yes    Birth control/protection: None  Other Topics Concern   Not on file  Social History Narrative   Married   Regular exercise   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   [place] of 2    Working as guardian adlitem     Ex smoker  [place]   2 cats and a dog          Social Determinants of Radio broadcast assistant Strain: Low Risk  (12/01/2021)   Overall Financial Resource Strain (CARDIA)    Difficulty of Paying Living Expenses: Not hard at all  Food Insecurity: No Food Insecurity (12/01/2021)   Hunger Vital Sign    Worried About Running Out of Food in the Last Year: Never true    [place] in the Last Year: Never true  Transportation Needs: No Transportation Needs (12/01/2021)   PRAPARE - Hydrologist (Medical): No    Lack of Transportation (Non-Medical): No  Physical Activity: Inactive (12/01/2021)   Exercise  Vital Sign    Days of Exercise per Week: 0 days    Minutes of Exercise per Session: 0 min  Stress: Stress Concern Present (12/01/2021)   [place]    Feeling of Stress : To some extent  Social Connections: Moderately Integrated (11/25/2020)   Social Connection and Isolation Panel [NHANES]    Frequency of Communication with Friends and Family: More than three times a week    Frequency of Social Gatherings with Friends and Family: More than three times a week    Attends Religious Services: More than 4 times per year    Active Member of [employer] or Organizations: No    Attends Archivist Meetings: Never    Marital Status: Married    There were no vitals filed for this visit. There is no height or weight on file to calculate BMI.  Physical Exam  ASSESSMENT AND PLAN: There are no diagnoses linked to this encounter.  No follow-ups on file.  Crystalee Ventress G. [place], MD  [facility]. [place] office.  Discharge Instructions   None

## 2022-07-13 ENCOUNTER — Ambulatory Visit (INDEPENDENT_AMBULATORY_CARE_PROVIDER_SITE_OTHER): Payer: Medicare Other | Admitting: Family Medicine

## 2022-07-13 ENCOUNTER — Encounter: Payer: Self-pay | Admitting: Family Medicine

## 2022-07-13 VITALS — BP 104/76 | HR 100 | Temp 98.1°F | Resp 16 | Ht 65.0 in | Wt 219.5 lb

## 2022-07-13 DIAGNOSIS — J069 Acute upper respiratory infection, unspecified: Secondary | ICD-10-CM | POA: Diagnosis not present

## 2022-07-13 DIAGNOSIS — R590 Localized enlarged lymph nodes: Secondary | ICD-10-CM | POA: Diagnosis not present

## 2022-07-13 NOTE — Patient Instructions (Signed)
A few things to remember from today's visit:  URI, acute  Cervical lymphadenopathy Flonase nasal spray daily for 10-14 days then as needed. Nasal saline irrigations. Plain Mucinex.  Monitor lymph node for changes. If any fever or night sweats, please let me know.  If you need refills for medications you take chronically, please call your pharmacy. Do not use My Chart to request refills or for acute issues that need immediate attention. If you send a my chart message, it may take a few days to be addressed, specially if I am not in the office.  Please be sure medication list is accurate. If a new problem present, please set up appointment sooner than planned today.

## 2022-07-19 ENCOUNTER — Other Ambulatory Visit: Payer: Self-pay | Admitting: Internal Medicine

## 2022-07-27 ENCOUNTER — Other Ambulatory Visit: Payer: Self-pay | Admitting: Internal Medicine

## 2022-08-08 ENCOUNTER — Other Ambulatory Visit: Payer: Self-pay | Admitting: Internal Medicine

## 2022-08-17 ENCOUNTER — Telehealth: Payer: Self-pay | Admitting: Sports Medicine

## 2022-08-17 NOTE — Telephone Encounter (Signed)
Patient called requesting to schedule an appointment for a repeat gel injection. Durolane last given on 02/19/22.   Can she be run for approval?

## 2022-08-17 NOTE — Telephone Encounter (Signed)
I can run her on the 8th that will be six months

## 2022-08-20 NOTE — Telephone Encounter (Signed)
Ran patient through the portal for Durolane. Awaiting a response and then we can schedule patient

## 2022-08-23 NOTE — Telephone Encounter (Signed)
Patient authorized for durolane and can schedule as long as we have in stock

## 2022-08-23 NOTE — Progress Notes (Unsigned)
No chief complaint on file.   HPI: Rachel Blair 72 y.o. come in for  Saw Dr Martinique for uia and cervical ln jan 24  By med dm sept 23   on faxiga  controlleding dm  ROS: See pertinent positives and negatives per HPI.  Past Medical History:  Diagnosis Date   Allergy    Arthritis    CHEST DISCOMFORT 08/30/2007   Qualifier: Diagnosis of  By: Regis Bill MD, Standley Brooking    Chest pain    atypical   History of pneumonia    Hypertension    Nasal septal deviation    OBSTRUCTIVE SLEEP APNEA 08/30/2007   Qualifier: Diagnosis of  By: Regis Bill MD, Standley Brooking    Odontogenic cyst    of left maxillary sinus   OSA (obstructive sleep apnea)    moderatley severe, uses CPAP nightly   Trigger thumb of left hand     Family History  Problem Relation Age of Onset   Heart disease Mother    Stroke Father    Heart failure Father    Emphysema Father    Rheum arthritis Daughter    Breast cancer Paternal Aunt    Snoring Brother    Lung cancer Brother     Social History   Socioeconomic History   Marital status: Married    Spouse name: Not on file   Number of children: 3   Years of education: Not on file   Highest education level: Not on file  Occupational History   Occupation: retired  Tobacco Use   Smoking status: Former    Packs/day: 1.00    Years: 30.00    Total pack years: 30.00    Types: Cigarettes    Quit date: 06/14/1998    Years since quitting: 24.2   Smokeless tobacco: Never  Vaping Use   Vaping Use: Never used  Substance and Sexual Activity   Alcohol use: Yes    Comment: once a month   Drug use: No   Sexual activity: Yes    Birth control/protection: None  Other Topics Concern   Not on file  Social History Narrative   Married   Regular exercise   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   [place] of 2    Working as guardian Doctor, general practice     Ex smoker  Stopped 2000   G3P3   2 cats and a dog          Social Determinants of  Radio broadcast assistant Strain: Low Risk  (12/01/2021)   Overall Financial Resource Strain (CARDIA)    Difficulty of Paying Living Expenses: Not hard at all  Food Insecurity: No Food Insecurity (12/01/2021)   Hunger Vital Sign    Worried About Running Out of Food in the Last Year: Never true    Ran Out of Food in the Last Year: Never true  Transportation Needs: No Transportation Needs (12/01/2021)   PRAPARE - Hydrologist (Medical): No    Lack of Transportation (Non-Medical): No  Physical Activity: Inactive (12/01/2021)   Exercise Vital Sign    Days of Exercise per Week: 0 days    Minutes of Exercise per Session: 0 min  Stress: Stress Concern Present (12/01/2021)   [place]    Feeling of Stress : To some extent  Social Connections: Moderately Integrated (11/25/2020)  Social Licensed conveyancer [NHANES]    Frequency of Communication with Friends and Family: More than three times a week    Frequency of Social Gatherings with Friends and Family: More than three times a week    Attends Religious Services: More than 4 times per year    Active Member of [employer] or Organizations: No    Attends Music therapist: Never    Marital Status: Married    Outpatient Medications Prior to Visit  Medication Sig Dispense Refill   ACCU-CHEK GUIDE test strip USE AS DIRECTED TO TEST BLOOD SUGAR 1 TO 2 TIMES PER DAY AS DIRECTED. 50 strip 1   Accu-Chek Softclix Lancets lancets USE 2 LANCETS DAILY 100 each 0   Blood Glucose Monitoring Suppl (ACCU-CHEK GUIDE) w/Device KIT USE UP TO 1-2 TIMES DAILY AS DIRECTED 1 kit 2   cycloSPORINE (RESTASIS) 0.05 % ophthalmic emulsion Place 1 drop into both eyes 2 (two) times daily.     FARXIGA 5 MG TABS tablet TAKE 1 TABLET(5 MG) BY MOUTH DAILY BEFORE BREAKFAST 30 tablet 3   hydrochlorothiazide (HYDRODIURIL) 25 MG tablet TAKE 1 TABLET(25 MG) BY MOUTH DAILY  90 tablet 0   LORazepam (ATIVAN) 1 MG tablet Take 1 tablet (1 mg total) by mouth every 8 (eight) hours as needed for anxiety. 30 tablet 0   rosuvastatin (CRESTOR) 5 MG tablet TAKE 1 TABLET(5 MG) BY MOUTH DAILY 90 tablet 1   zolpidem (AMBIEN) 10 MG tablet TAKE 1 TABLET(10 MG) BY MOUTH AT BEDTIME AS NEEDED FOR SLEEP 90 tablet 0   No facility-administered medications prior to visit.     EXAM:  There were no vitals taken for this visit.  There is no height or weight on file to calculate BMI.  GENERAL: vitals reviewed and listed above, alert, oriented, appears well hydrated and in no acute distress HEENT: atraumatic, conjunctiva  clear, no obvious abnormalities on inspection of external nose and ears OP : no lesion edema or exudate  NECK: no obvious masses on inspection palpation  LUNGS: clear to auscultation bilaterally, no wheezes, rales or rhonchi, good air movement CV: HRRR, no clubbing cyanosis or  peripheral edema nl cap refill  MS: moves all extremities without noticeable focal  abnormality PSYCH: pleasant and cooperative, no obvious depression or anxiety Lab Results  Component Value Date   WBC 7.6 03/06/2022   HGB 15.5 (H) 03/06/2022   HCT 44.8 03/06/2022   PLT 203 03/06/2022   GLUCOSE 123 (H) 04/05/2022   CHOL 117 02/17/2022   TRIG 203.0 (H) 02/17/2022   HDL 44.70 02/17/2022   LDLDIRECT 52.0 02/17/2022   LDLCALC 41 06/30/2021   ALT 29 03/06/2022   AST 20 03/06/2022   NA 141 04/05/2022   K 3.8 04/05/2022   CL 102 04/05/2022   CREATININE 0.86 04/05/2022   BUN 12 04/05/2022   CO2 27 04/05/2022   TSH 1.84 03/23/2019   HGBA1C 6.5 02/17/2022   BP Readings from Last 3 Encounters:  07/13/22 104/76  04/15/22 129/78  04/05/22 128/78    ASSESSMENT AND PLAN:  Discussed the following assessment and plan:  Type 2 diabetes mellitus with hyperglycemia, without long-term current use of insulin (HCC)  Essential hypertension  Medication management  Obesity (BMI  30-39.9)  Hyperlipidemia, unspecified hyperlipidemia type  Complex sleep apnea syndrome Due A1c  -Patient advised to return or notify health care team  if  new concerns arise.  There are no Patient Instructions on file for this visit.  Standley Brooking. Wreatha Sturgeon M.D.

## 2022-08-24 ENCOUNTER — Encounter: Payer: Self-pay | Admitting: Internal Medicine

## 2022-08-24 ENCOUNTER — Ambulatory Visit (INDEPENDENT_AMBULATORY_CARE_PROVIDER_SITE_OTHER): Payer: Medicare Other | Admitting: Internal Medicine

## 2022-08-24 VITALS — BP 124/61 | HR 79 | Temp 97.9°F | Ht 65.0 in | Wt 218.2 lb

## 2022-08-24 DIAGNOSIS — Z79899 Other long term (current) drug therapy: Secondary | ICD-10-CM | POA: Diagnosis not present

## 2022-08-24 DIAGNOSIS — E669 Obesity, unspecified: Secondary | ICD-10-CM

## 2022-08-24 DIAGNOSIS — E1165 Type 2 diabetes mellitus with hyperglycemia: Secondary | ICD-10-CM | POA: Diagnosis not present

## 2022-08-24 DIAGNOSIS — Z5181 Encounter for therapeutic drug level monitoring: Secondary | ICD-10-CM

## 2022-08-24 DIAGNOSIS — G4731 Primary central sleep apnea: Secondary | ICD-10-CM

## 2022-08-24 DIAGNOSIS — E785 Hyperlipidemia, unspecified: Secondary | ICD-10-CM

## 2022-08-24 DIAGNOSIS — R590 Localized enlarged lymph nodes: Secondary | ICD-10-CM

## 2022-08-24 DIAGNOSIS — R3915 Urgency of urination: Secondary | ICD-10-CM

## 2022-08-24 DIAGNOSIS — I10 Essential (primary) hypertension: Secondary | ICD-10-CM | POA: Diagnosis not present

## 2022-08-24 LAB — POCT GLYCOSYLATED HEMOGLOBIN (HGB A1C): Hemoglobin A1C: 6.6 % — AB (ref 4.0–5.6)

## 2022-08-24 LAB — POCT URINALYSIS DIPSTICK
Bilirubin, UA: POSITIVE
Blood, UA: POSITIVE
Glucose, UA: POSITIVE — AB
Ketones, UA: POSITIVE
Leukocytes, UA: NEGATIVE
Nitrite, UA: NEGATIVE
Protein, UA: POSITIVE — AB
Spec Grav, UA: 1.02 (ref 1.010–1.025)
Urobilinogen, UA: 0.2 E.U./dL
pH, UA: 6 (ref 5.0–8.0)

## 2022-08-24 MED ORDER — FARXIGA 5 MG PO TABS: 5.0000 mg | ORAL_TABLET | Freq: Every day | ORAL | 3 refills | Status: DC

## 2022-08-24 MED ORDER — TIRZEPATIDE 2.5 MG/0.5ML ~~LOC~~ SOAJ: 2.5000 mg | SUBCUTANEOUS | 1 refills | Status: DC

## 2022-08-24 NOTE — Telephone Encounter (Signed)
Left message for patient to call back to schedule.  °

## 2022-08-24 NOTE — Patient Instructions (Signed)
Will send in  Mio again as a brand.  And you can also contact insurance about why medication is rejected since farxiga is on the list or alternatives.   Will send in mounjaro   for weekly injection   If approved   after a month  make a virtual visit  to decide on medication  titration.

## 2022-08-30 NOTE — Progress Notes (Unsigned)
    Benito Mccreedy D.[place] Phone: [phone]   Assessment and Plan:     There are no diagnoses linked to this encounter.  ***   Pertinent previous records reviewed include ***   Follow Up: ***     Subjective:   I, Morton Simson, am serving as a Education administrator for Doctor Glennon Mac   Chief Complaint: gel injection    HPI:    02/19/22 Patient is here for gel injection.  Patient overall experienced 6 to 7 months relief from last CSI, however she has been active around her house and is felt a flare of knee pain.  Interested in additional injection today.  Denies new trauma, falls, numbness/tingling/weakness   03/26/2022 Patient states that it took a little bit to kick in , but it has helped , she still isnt able to get down on her knees    08/31/2022 Patient states    Relevant Historical Information: DM type II  Additional pertinent review of systems negative.   Current Outpatient Medications:    ACCU-CHEK GUIDE test strip, USE AS DIRECTED TO TEST BLOOD SUGAR 1 TO 2 TIMES PER DAY AS DIRECTED., Disp: 50 strip, Rfl: 1   Accu-Chek Softclix Lancets lancets, USE 2 LANCETS DAILY, Disp: 100 each, Rfl: 0   Blood Glucose Monitoring Suppl (ACCU-CHEK GUIDE) w/Device KIT, USE UP TO 1-2 TIMES DAILY AS DIRECTED, Disp: 1 kit, Rfl: 2   cycloSPORINE (RESTASIS) 0.05 % ophthalmic emulsion, Place 1 drop into both eyes 2 (two) times daily., Disp: , Rfl:    FARXIGA 5 MG TABS tablet, TAKE 1 TABLET(5 MG) BY MOUTH DAILY BEFORE BREAKFAST, Disp: 30 tablet, Rfl: 3   FARXIGA 5 MG TABS tablet, Take 1 tablet (5 mg total) by mouth daily before breakfast., Disp: 30 tablet, Rfl: 3   hydrochlorothiazide (HYDRODIURIL) 25 MG tablet, TAKE 1 TABLET(25 MG) BY MOUTH DAILY, Disp: 90 tablet, Rfl: 0   LORazepam (ATIVAN) 1 MG tablet, Take 1 tablet (1 mg total) by mouth every 8 (eight) hours as needed for anxiety., Disp: 30 tablet, Rfl: 0    rosuvastatin (CRESTOR) 5 MG tablet, TAKE 1 TABLET(5 MG) BY MOUTH DAILY, Disp: 90 tablet, Rfl: 1   tirzepatide (MOUNJARO) 2.5 MG/0.5ML Pen, Inject 2.5 mg into the skin once a week., Disp: 2 mL, Rfl: 1   zolpidem (AMBIEN) 10 MG tablet, TAKE 1 TABLET(10 MG) BY MOUTH AT BEDTIME AS NEEDED FOR SLEEP, Disp: 90 tablet, Rfl: 0   Objective:     There were no vitals filed for this visit.    There is no height or weight on file to calculate BMI.    Physical Exam:    ***   Electronically signed by:  Benito Mccreedy D.[name] 10:25 AM 08/30/22

## 2022-08-31 ENCOUNTER — Ambulatory Visit (INDEPENDENT_AMBULATORY_CARE_PROVIDER_SITE_OTHER): Payer: Medicare Other | Admitting: Sports Medicine

## 2022-08-31 DIAGNOSIS — M1712 Unilateral primary osteoarthritis, left knee: Secondary | ICD-10-CM | POA: Diagnosis not present

## 2022-08-31 DIAGNOSIS — G8929 Other chronic pain: Secondary | ICD-10-CM | POA: Diagnosis not present

## 2022-08-31 DIAGNOSIS — M23207 Derangement of unspecified meniscus due to old tear or injury, left knee: Secondary | ICD-10-CM | POA: Diagnosis not present

## 2022-08-31 DIAGNOSIS — M25562 Pain in left knee: Secondary | ICD-10-CM

## 2022-08-31 MED ORDER — SODIUM HYALURONATE 60 MG/3ML IX PRSY
60.0000 mg | PREFILLED_SYRINGE | Freq: Once | INTRA_ARTICULAR | Status: AC
  Administered 2022-08-31: 60 mg via INTRA_ARTICULAR

## 2022-09-14 ENCOUNTER — Other Ambulatory Visit: Payer: Self-pay | Admitting: Internal Medicine

## 2022-10-07 ENCOUNTER — Telehealth (INDEPENDENT_AMBULATORY_CARE_PROVIDER_SITE_OTHER): Payer: Medicare Other | Admitting: Internal Medicine

## 2022-10-07 ENCOUNTER — Encounter: Payer: Self-pay | Admitting: Internal Medicine

## 2022-10-07 VITALS — Ht 65.0 in | Wt 216.0 lb

## 2022-10-07 DIAGNOSIS — E1165 Type 2 diabetes mellitus with hyperglycemia: Secondary | ICD-10-CM | POA: Diagnosis not present

## 2022-10-07 DIAGNOSIS — E669 Obesity, unspecified: Secondary | ICD-10-CM | POA: Diagnosis not present

## 2022-10-07 DIAGNOSIS — Z79899 Other long term (current) drug therapy: Secondary | ICD-10-CM | POA: Diagnosis not present

## 2022-10-07 DIAGNOSIS — I10 Essential (primary) hypertension: Secondary | ICD-10-CM

## 2022-10-07 NOTE — Progress Notes (Signed)
Virtual Visit via Video Note  I connected with Rachel Blair on 10/07/22 at  9:30 AM EDT by a video enabled telemedicine application and verified that I am speaking with the correct person using two identifiers. Location patient: home Location provider:work or home office Persons participating in the virtual visit: patient, provider  Patient aware  of the limitations of evaluation and management by telemedicine and  availability of in person appointments. and agreed to proceed.   HPI: Rachel Blair presents for video visit fu beginning new glp1 gip med  has had 4 weeks of med and doing ok  dec appetitenoted no nv Weight maybe has helped  last weight 216  Bg better down to the 100 range  and no lows. Had  some wavy vision episodes but doesn't think from  the med ( has eye doc)  Is on doxycycline for infected cyst  from derm. ROS: See pertinent positives and negatives per HPI.  Past Medical History:  Diagnosis Date   Allergy    Arthritis    CHEST DISCOMFORT 08/30/2007   Qualifier: Diagnosis of  By: Fabian Sharp MD, Neta Mends    Chest pain    atypical   History of pneumonia    Hypertension    Nasal septal deviation    OBSTRUCTIVE SLEEP APNEA 08/30/2007   Qualifier: Diagnosis of  By: Fabian Sharp MD, Neta Mends    Odontogenic cyst    of left maxillary sinus   OSA (obstructive sleep apnea)    moderatley severe, uses CPAP nightly   Trigger thumb of left hand     Past Surgical History:  Procedure Laterality Date   biopsy and bone graft of left maxillary sinus odontogenic cyst     CHOLECYSTECTOMY     left thumb trigger finger release Right    TONSILLECTOMY     TRIGGER FINGER RELEASE Left 05/27/2015   Procedure: RELEASE TRIGGER FINGER/A-1 PULLEY LEFT THUMB;  Surgeon: Cindee Salt, MD;  Location: [facility];  Service: Orthopedics;  Laterality: Left;   TUBAL LIGATION      Family History  Problem Relation Age of Onset   Heart disease Mother    Stroke Father    Heart failure Father     Emphysema Father    Rheum arthritis Daughter    Breast cancer Paternal Aunt    Snoring Brother    Lung cancer Brother     Social History   Tobacco Use   Smoking status: Former    Packs/day: 1.00    Years: 30.00    Additional pack years: 0.00    Total pack years: 30.00    Types: Cigarettes    Quit date: 06/14/1998    Years since quitting: 24.3   Smokeless tobacco: Never  Vaping Use   Vaping Use: Never used  Substance Use Topics   Alcohol use: Yes    Comment: once a month   Drug use: No      Current Outpatient Medications:    ACCU-CHEK GUIDE test strip, USE AS DIRECTED TO TEST BLOOD SUGAR 1 TO 2 TIMES DAILY, Disp: 50 strip, Rfl: 1   Accu-Chek Softclix Lancets lancets, USE 2 LANCETS DAILY, Disp: 100 each, Rfl: 0   Blood Glucose Monitoring Suppl (ACCU-CHEK GUIDE) w/Device KIT, USE UP TO 1-2 TIMES DAILY AS DIRECTED, Disp: 1 kit, Rfl: 2   cycloSPORINE (RESTASIS) 0.05 % ophthalmic emulsion, Place 1 drop into both eyes 2 (two) times daily., Disp: , Rfl:    doxycycline (VIBRA-TABS) 100 MG  tablet, Take 100 mg by mouth 2 (two) times daily., Disp: , Rfl:    FARXIGA 5 MG TABS tablet, Take 1 tablet (5 mg total) by mouth daily before breakfast., Disp: 30 tablet, Rfl: 3   hydrochlorothiazide (HYDRODIURIL) 25 MG tablet, TAKE 1 TABLET(25 MG) BY MOUTH DAILY, Disp: 90 tablet, Rfl: 0   LORazepam (ATIVAN) 1 MG tablet, Take 1 tablet (1 mg total) by mouth every 8 (eight) hours as needed for anxiety., Disp: 30 tablet, Rfl: 0   rosuvastatin (CRESTOR) 5 MG tablet, TAKE 1 TABLET(5 MG) BY MOUTH DAILY, Disp: 90 tablet, Rfl: 1   tirzepatide (MOUNJARO) 2.5 MG/0.5ML Pen, Inject 2.5 mg into the skin once a week., Disp: 2 mL, Rfl: 1   zolpidem (AMBIEN) 10 MG tablet, TAKE 1 TABLET(10 MG) BY MOUTH AT BEDTIME AS NEEDED FOR SLEEP, Disp: 90 tablet, Rfl: 0  EXAM: BP Readings from Last 3 Encounters:  08/24/22 124/61  07/13/22 104/76  04/15/22 129/78   Wt Readings from Last 3 Encounters:  10/07/22 216 lb (98  kg)  08/24/22 218 lb 3.2 oz (99 kg)  07/13/22 219 lb 8 oz (99.6 kg)     VITALS per patient if applicable:  GENERAL: alert, oriented, appears well and in no acute distress  HEENT: atraumatic, conjunttiva clear, no obvious abnormalities on inspection of external nose and ears  NECK: normal movements of the head and neck  LUNGS: on inspection no signs of respiratory distress, breathing rate appears normal, no obvious gross SOB, gasping or wheezing  CV: no obvious cyanosis  PSYCH/NEURO: pleasant and cooperative, no obvious depression or anxiety, speech and thought processing grossly intact Lab Results  Component Value Date   WBC 7.6 03/06/2022   HGB 15.5 (H) 03/06/2022   HCT 44.8 03/06/2022   PLT 203 03/06/2022   GLUCOSE 123 (H) 04/05/2022   CHOL 117 02/17/2022   TRIG 203.0 (H) 02/17/2022   HDL 44.70 02/17/2022   LDLDIRECT 52.0 02/17/2022   LDLCALC 41 06/30/2021   ALT 29 03/06/2022   AST 20 03/06/2022   NA 141 04/05/2022   K 3.8 04/05/2022   CL 102 04/05/2022   CREATININE 0.86 04/05/2022   BUN 12 04/05/2022   CO2 27 04/05/2022   TSH 1.84 03/23/2019   HGBA1C 6.6 (A) 08/24/2022    ASSESSMENT AND PLAN:  Discussed the following assessment and plan:    ICD-10-CM   1. Type 2 diabetes mellitus with hyperglycemia, without long-term current use of insulin  E11.65     2. Essential hypertension  I10     3. Medication management  Z79.899     4. Obesity (BMI 30-39.9)  E66.9      Stay on same mounjaro dose 2.5 and send message about status before run out about increasing dose of  med to 5 mg if tolerated . Plan in person ov in 2-3 mos to check bp weight and A1c  or as indicated  Suspect eye sx are not related to med  ( has had cataract surgery and some sx in past )  Counseled.   Expectant management and discussion of plan and treatment with opportunity to ask questions and all were answered. The patient agreed with the plan and demonstrated an understanding of the  instructions.   Advised to call back or seek an in-person evaluation if worsening  or having  further concerns  in interim. Return for 2-3 mos in person  message about  med refill dosage in 1 mos.    [name],  MD

## 2022-10-17 ENCOUNTER — Other Ambulatory Visit: Payer: Self-pay | Admitting: Family

## 2022-10-20 ENCOUNTER — Other Ambulatory Visit: Payer: Self-pay | Admitting: Internal Medicine

## 2022-10-20 MED ORDER — HYDROCHLOROTHIAZIDE 25 MG PO TABS: 25.0000 mg | ORAL_TABLET | Freq: Every day | ORAL | 0 refills | Status: DC

## 2022-10-21 MED ORDER — ZOLPIDEM TARTRATE 10 MG PO TABS: ORAL_TABLET | ORAL | 0 refills | Status: DC

## 2022-10-31 ENCOUNTER — Encounter: Payer: Self-pay | Admitting: Internal Medicine

## 2022-11-01 ENCOUNTER — Other Ambulatory Visit: Payer: Self-pay | Admitting: Family

## 2022-11-01 MED ORDER — TIRZEPATIDE 5 MG/0.5ML ~~LOC~~ SOAJ: 5.0000 mg | SUBCUTANEOUS | 1 refills | Status: DC

## 2022-11-12 ENCOUNTER — Ambulatory Visit (INDEPENDENT_AMBULATORY_CARE_PROVIDER_SITE_OTHER): Payer: Medicare Other

## 2022-11-12 VITALS — Ht 65.0 in | Wt 207.0 lb

## 2022-11-12 DIAGNOSIS — Z0000 Encounter for general adult medical examination without abnormal findings: Secondary | ICD-10-CM | POA: Diagnosis not present

## 2022-11-12 NOTE — Patient Instructions (Addendum)
Rachel Blair , Thank you for taking time to come for your Medicare Wellness Visit. I appreciate your ongoing commitment to your health goals. Please review the following plan we discussed and let me know if I can assist you in the future.   These are the goals we discussed:  Goals       Lose weight (pt-stated)      Patient Stated      Lose 100lbs      Patient Stated      12/01/2021, wants to lose weight and get blood sugar under control        This is a list of the screening recommended for you and due dates:  Health Maintenance  Topic Date Due   Complete foot exam   Never done   Yearly kidney health urinalysis for diabetes  Never done   Eye exam for diabetics  01/20/2020   DTaP/Tdap/Td vaccine (2 - Tdap) 06/09/2020   COVID-19 Vaccine (4 - 2023-24 season) 11/28/2022*   Colon Cancer Screening  02/12/2023*   Pneumonia Vaccine (3 of 3 - PPSV23 or PCV20) 11/12/2023*   Flu Shot  01/13/2023   Hemoglobin A1C  02/24/2023   Yearly kidney function blood test for diabetes  04/06/2023   Medicare Annual Wellness Visit  11/12/2023   Mammogram  01/30/2024   DEXA scan (bone density measurement)  Completed   Hepatitis C Screening  Completed   Zoster (Shingles) Vaccine  Completed   HPV Vaccine  Aged Out  *Topic was postponed. The date shown is not the original due date.    Advanced directives: Advance directive discussed with you today. Even though you declined this today, please call our office should you change your mind, and we can give you the proper paperwork for you to fill out.   Conditions/risks identified: None  Next appointment: Follow up in one year for your annual wellness visit    Preventive Care 65 Years and Older, Female Preventive care refers to lifestyle choices and visits with your health care provider that can promote health and wellness. What does preventive care include? A yearly physical exam. This is also called an annual well check. Dental exams once or twice a  year. Routine eye exams. Ask your health care provider how often you should have your eyes checked. Personal lifestyle choices, including: Daily care of your teeth and gums. Regular physical activity. Eating a healthy diet. Avoiding tobacco and drug use. Limiting alcohol use. Practicing safe sex. Taking low-dose aspirin every day. Taking vitamin and mineral supplements as recommended by your health care provider. What happens during an annual well check? The services and screenings done by your health care provider during your annual well check will depend on your age, overall health, lifestyle risk factors, and family history of disease. Counseling  Your health care provider may ask you questions about your: Alcohol use. Tobacco use. Drug use. Emotional well-being. Home and relationship well-being. Sexual activity. Eating habits. History of falls. Memory and ability to understand (cognition). Work and work Astronomer. Reproductive health. Screening  You may have the following tests or measurements: Height, weight, and BMI. Blood pressure. Lipid and cholesterol levels. These may be checked every 5 years, or more frequently if you are over 79 years old. Skin check. Lung cancer screening. You may have this screening every year starting at age 32 if you have a 30-pack-year history of smoking and currently smoke or have quit within the past 15 years. Fecal occult blood test (FOBT) of  the stool. You may have this test every year starting at age 52. Flexible sigmoidoscopy or colonoscopy. You may have a sigmoidoscopy every 5 years or a colonoscopy every 10 years starting at age 18. Hepatitis C blood test. Hepatitis B blood test. Sexually transmitted disease (STD) testing. Diabetes screening. This is done by checking your blood sugar (glucose) after you have not eaten for a while (fasting). You may have this done every 1-3 years. Bone density scan. This is done to screen for  osteoporosis. You may have this done starting at age 5. Mammogram. This may be done every 1-2 years. Talk to your health care provider about how often you should have regular mammograms. Talk with your health care provider about your test results, treatment options, and if necessary, the need for more tests. Vaccines  Your health care provider may recommend certain vaccines, such as: Influenza vaccine. This is recommended every year. Tetanus, diphtheria, and acellular pertussis (Tdap, Td) vaccine. You may need a Td booster every 10 years. Zoster vaccine. You may need this after age 88. Pneumococcal 13-valent conjugate (PCV13) vaccine. One dose is recommended after age 4. Pneumococcal polysaccharide (PPSV23) vaccine. One dose is recommended after age 55. Talk to your health care provider about which screenings and vaccines you need and how often you need them. This information is not intended to replace advice given to you by your health care provider. Make sure you discuss any questions you have with your health care provider. Document Released: 06/27/2015 Document Revised: 02/18/2016 Document Reviewed: 04/01/2015 Elsevier Interactive Patient Education  2017 [employer].  Fall Prevention in the Home Falls can cause injuries. They can happen to people of all ages. There are many things you can do to make your home safe and to help prevent falls. What can I do on the outside of my home? Regularly fix the edges of walkways and driveways and fix any cracks. Remove anything that might make you trip as you walk through a door, such as a raised step or threshold. Trim any bushes or trees on the path to your home. Use bright outdoor lighting. Clear any walking paths of anything that might make someone trip, such as rocks or tools. Regularly check to see if handrails are loose or broken. Make sure that both sides of any steps have handrails. Any raised decks and porches should have guardrails on  the edges. Have any leaves, snow, or ice cleared regularly. Use sand or salt on walking paths during winter. Clean up any spills in your garage right away. This includes oil or grease spills. What can I do in the bathroom? Use night lights. Install grab bars by the toilet and in the tub and shower. Do not use towel bars as grab bars. Use non-skid mats or decals in the tub or shower. If you need to sit down in the shower, use a plastic, non-slip stool. Keep the floor dry. Clean up any water that spills on the floor as soon as it happens. Remove soap buildup in the tub or shower regularly. Attach bath mats securely with double-sided non-slip rug tape. Do not have throw rugs and other things on the floor that can make you trip. What can I do in the bedroom? Use night lights. Make sure that you have a light by your bed that is easy to reach. Do not use any sheets or blankets that are too big for your bed. They should not hang down onto the floor. Have a firm chair  that has side arms. You can use this for support while you get dressed. Do not have throw rugs and other things on the floor that can make you trip. What can I do in the kitchen? Clean up any spills right away. Avoid walking on wet floors. Keep items that you use a lot in easy-to-reach places. If you need to reach something above you, use a strong step stool that has a grab bar. Keep electrical cords out of the way. Do not use floor polish or wax that makes floors slippery. If you must use wax, use non-skid floor wax. Do not have throw rugs and other things on the floor that can make you trip. What can I do with my stairs? Do not leave any items on the stairs. Make sure that there are handrails on both sides of the stairs and use them. Fix handrails that are broken or loose. Make sure that handrails are as long as the stairways. Check any carpeting to make sure that it is firmly attached to the stairs. Fix any carpet that is loose  or worn. Avoid having throw rugs at the top or bottom of the stairs. If you do have throw rugs, attach them to the floor with carpet tape. Make sure that you have a light switch at the top of the stairs and the bottom of the stairs. If you do not have them, ask someone to add them for you. What else can I do to help prevent falls? Wear shoes that: Do not have high heels. Have rubber bottoms. Are comfortable and fit you well. Are closed at the toe. Do not wear sandals. If you use a stepladder: Make sure that it is fully opened. Do not climb a closed stepladder. Make sure that both sides of the stepladder are locked into place. Ask someone to hold it for you, if possible. Clearly mark and make sure that you can see: Any grab bars or handrails. First and last steps. Where the edge of each step is. Use tools that help you move around (mobility aids) if they are needed. These include: Canes. Walkers. Scooters. Crutches. Turn on the lights when you go into a dark area. Replace any light bulbs as soon as they burn out. Set up your furniture so you have a clear path. Avoid moving your furniture around. If any of your floors are uneven, fix them. If there are any pets around you, be aware of where they are. Review your medicines with your doctor. Some medicines can make you feel dizzy. This can increase your chance of falling. Ask your doctor what other things that you can do to help prevent falls. This information is not intended to replace advice given to you by your health care provider. Make sure you discuss any questions you have with your health care provider. Document Released: 03/27/2009 Document Revised: 11/06/2015 Document Reviewed: 07/05/2014 Elsevier Interactive Patient Education  2017 [employer].

## 2022-11-12 NOTE — Progress Notes (Signed)
Subjective:   Rachel Blair is a 72 y.o. female who presents for Medicare Annual (Subsequent) preventive examination.  Review of Systems    Virtual Visit via Telephone Note  I connected with  Rachel Blair on 11/12/22 at 10:45 AM EDT by telephone and verified that I am speaking with the correct person using two identifiers.  Location: Patient: Home Provider: Office Persons participating in the virtual visit: patient/Nurse Health Advisor   I discussed the limitations, risks, security and privacy concerns of performing an evaluation and management service by telephone and the availability of in person appointments. The patient expressed understanding and agreed to proceed.  Interactive audio and video telecommunications were attempted between this nurse and patient, however failed, due to patient having technical difficulties OR patient did not have access to video capability.  We continued and completed visit with audio only.  Some vital signs may be absent or patient reported.   Tillie Rung, LPN  Cardiac Risk Factors include: advanced age (>31men, >69 women);diabetes mellitus;hypertension     Objective:    Today's Vitals   11/12/22 1048  Weight: 207 lb (93.9 kg)  Height: 5\' 5"  (1.651 m)   Body mass index is 34.45 kg/m.     11/12/2022   10:57 AM 12/01/2021   12:05 PM 11/25/2020   10:25 AM 05/27/2015    9:01 AM 05/23/2015   10:46 AM 07/18/2014    8:13 PM  Advanced Directives  Does Patient Have a Medical Advance Directive? No No No No No No  Would patient like information on creating a medical advance directive? No - Patient declined  Yes (MAU/Ambulatory/Procedural Areas - Information given) No - patient declined information  No - patient declined information    Current Medications (verified) Outpatient Encounter Medications as of 11/12/2022  Medication Sig   tirzepatide (MOUNJARO) 5 MG/0.5ML Pen Inject 5 mg into the skin once a week.   ACCU-CHEK GUIDE test strip USE AS  DIRECTED TO TEST BLOOD SUGAR 1 TO 2 TIMES DAILY   Accu-Chek Softclix Lancets lancets USE 2 LANCETS DAILY   Blood Glucose Monitoring Suppl (ACCU-CHEK GUIDE) w/Device KIT USE UP TO 1-2 TIMES DAILY AS DIRECTED   cycloSPORINE (RESTASIS) 0.05 % ophthalmic emulsion Place 1 drop into both eyes 2 (two) times daily.   doxycycline (VIBRA-TABS) 100 MG tablet Take 100 mg by mouth 2 (two) times daily.   FARXIGA 5 MG TABS tablet Take 1 tablet (5 mg total) by mouth daily before breakfast.   hydrochlorothiazide (HYDRODIURIL) 25 MG tablet Take 1 tablet (25 mg total) by mouth daily.   LORazepam (ATIVAN) 1 MG tablet Take 1 tablet (1 mg total) by mouth every 8 (eight) hours as needed for anxiety.   rosuvastatin (CRESTOR) 5 MG tablet TAKE 1 TABLET(5 MG) BY MOUTH DAILY   zolpidem (AMBIEN) 10 MG tablet TAKE 1 TABLET(10 MG) BY MOUTH AT BEDTIME AS NEEDED FOR SLEEP   No facility-administered encounter medications on file as of 11/12/2022.    Allergies (verified) Amphetamine-dextroamphet er, Losartan, Duloxetine, Hydrocodone-acetaminophen, Metformin and related, and Morphine sulfate   History: Past Medical History:  Diagnosis Date   Allergy    Arthritis    CHEST DISCOMFORT 08/30/2007   Qualifier: Diagnosis of  By: Fabian Sharp MD, Neta Mends    Chest pain    atypical   History of pneumonia    Hypertension    Nasal septal deviation    OBSTRUCTIVE SLEEP APNEA 08/30/2007   Qualifier: Diagnosis of  By: Fabian Sharp MD, [name]  K    Odontogenic cyst    of left maxillary sinus   OSA (obstructive sleep apnea)    moderatley severe, uses CPAP nightly   Trigger thumb of left hand    Past Surgical History:  Procedure Laterality Date   biopsy and bone graft of left maxillary sinus odontogenic cyst     CHOLECYSTECTOMY     left thumb trigger finger release Right    TONSILLECTOMY     TRIGGER FINGER RELEASE Left 05/27/2015   Procedure: RELEASE TRIGGER FINGER/A-1 PULLEY LEFT THUMB;  Surgeon: Cindee Salt, MD;  Location: [facility];  Service: Orthopedics;  Laterality: Left;   TUBAL LIGATION     Family History  Problem Relation Age of Onset   Heart disease Mother    Stroke Father    Heart failure Father    Emphysema Father    Rheum arthritis Daughter    Breast cancer Paternal Aunt    Snoring Brother    Lung cancer Brother    Social History   Socioeconomic History   Marital status: Married    Spouse name: Not on file   Number of children: 3   Years of education: Not on file   Highest education level: Not on file  Occupational History   Occupation: retired  Tobacco Use   Smoking status: Former    Packs/day: 1.00    Years: 30.00    Additional pack years: 0.00    Total pack years: 30.00    Types: Cigarettes    Quit date: 06/14/1998    Years since quitting: 24.4   Smokeless tobacco: Never  Vaping Use   Vaping Use: Never used  Substance and Sexual Activity   Alcohol use: Yes    Comment: once a month   Drug use: No   Sexual activity: Yes    Birth control/protection: None  Other Topics Concern   Not on file  Social History Narrative   Married   Regular exercise   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     Ex smoker  Stopped 2000   G3P3   2 cats and a dog          Social Determinants of Corporate investment banker Strain: Low Risk  (11/12/2022)   Overall Financial Resource Strain (CARDIA)    Difficulty of Paying Living Expenses: Not hard at all  Food Insecurity: No Food Insecurity (11/12/2022)   Hunger Vital Sign    Worried About Running Out of Food in the Last Year: Never true    Ran Out of Food in the Last Year: Never true  Transportation Needs: No Transportation Needs (11/12/2022)   PRAPARE - Administrator, Civil Service (Medical): No    Lack of Transportation (Non-Medical): No  Physical Activity: Inactive (11/12/2022)   Exercise Vital Sign    Days of Exercise  per Week: 0 days    Minutes of Exercise per Session: 0 min  Stress: No Stress Concern Present (11/12/2022)   [employer] of Occupational Health - Occupational Stress Questionnaire    Feeling of Stress : Not at all  Social Connections: Moderately Isolated (11/12/2022)   Social Connection and Isolation Panel [NHANES]    Frequency of Communication with Friends and Family: More than three times a week    Frequency of Social Gatherings with Friends and Family:  More than three times a week    Attends Religious Services: Never    Active Member of Clubs or Organizations: No    Attends Banker Meetings: Never    Marital Status: Married    Tobacco Counseling Counseling given: Not Answered   Clinical Intake:  Pre-visit preparation completed: No  Pain : No/denies pain     BMI - recorded: 34.45 Nutritional Status: BMI > 30  Obese Nutritional Risks: None Diabetes: Yes CBG done?: No Did pt. bring in CBG monitor from home?: NoNutrition Risk Assessment:  Has the patient had any N/V/D within the last 2 months?  No  Does the patient have any non-healing wounds?  No  Has the patient had any unintentional weight loss or weight gain?  No   Diabetes:  Is the patient diabetic?  Yes  If diabetic, was a CBG obtained today?  No  Did the patient bring in their glucometer from home?  No  How often do you monitor your CBG's? Qhs.   Financial Strains and Diabetes Management:  Are you having any financial strains with the device, your supplies or your medication? No .  Does the patient want to be seen by Chronic Care Management for management of their diabetes?  No  Would the patient like to be referred to a Nutritionist or for Diabetic Management?  No   Diabetic Exams:  Diabetic Eye Exam: Completed . Overdue for diabetic eye exam. Pt has been advised about the importance in completing this exam. A referral has been placed today. Message sent to referral coordinator for  scheduling purposes. Advised pt to expect a call from office referred to regarding appt.  Diabetic Foot Exam: Completed . Pt has been advised about the importance in completing this exam. Pt is scheduled for diabetic foot exam on Followed by PCP.    How often do you need to have someone help you when you read instructions, pamphlets, or other written materials from your doctor or pharmacy?: 1 - Never  Diabetic?  Yes  Interpreter Needed?: No  Information entered by :: Theresa Mulligan LPN   Activities of Daily Living    11/12/2022   10:56 AM 12/01/2021   12:07 PM  In your present state of health, do you have any difficulty performing the following activities:  Hearing? 0 0  Vision? 0 0  Difficulty concentrating or making decisions? 0 0  Walking or climbing stairs? 0 0  Dressing or bathing? 0 0  Doing errands, shopping? 0 0  Preparing Food and eating ? N N  Using the Toilet? N N  In the past six months, have you accidently leaked urine? N N  Do you have problems with loss of bowel control? N N  Managing your Medications? N N  Managing your Finances? N N  Housekeeping or managing your Housekeeping? N N    Patient Care Team: Panosh, Neta Mends, MD as PCP - General (Internal Medicine) Oretha Milch, MD as Consulting Physician (Pulmonary Disease) Mia Creek, MD as Consulting Physician (Ophthalmology)  Indicate any recent Medical Services you may have received from other than [facility] providers in the past year (date may be approximate).     Assessment:   This is a routine wellness examination for Rachel Blair.  Hearing/Vision screen Hearing Screening - Comments:: Denies hearing difficulties   Vision Screening - Comments:: Wears rx glasses - up to date with routine eye exams with  Dr Virgel Manifold  Dietary issues and exercise activities discussed: Exercise limited  by: None identified   Goals Addressed               This Visit's Progress     Lose weight (pt-stated)          Depression Screen    11/12/2022   10:55 AM 07/13/2022    2:02 PM 12/03/2021    8:25 AM 12/01/2021   12:06 PM 10/29/2021    9:10 AM 10/05/2021    8:54 AM 07/06/2021   10:06 AM  PHQ 2/9 Scores  PHQ - 2 Score 0 0 4 0 0 0 0  PHQ- 9 Score   4  0 3 2    Fall Risk    11/12/2022   10:57 AM 07/13/2022    2:02 PM 12/01/2021   12:06 PM 10/29/2021    9:10 AM 10/05/2021    8:54 AM  Fall Risk   Falls in the past year? 0 0 0 0 0  Number falls in past yr: 0 0 0 0 0  Injury with Fall? 0 0 0 0 0  Risk for fall due to : No Fall Risks Other (Comment) Medication side effect No Fall Risks No Fall Risks  Follow up Falls prevention discussed Falls evaluation completed Falls evaluation completed;Education provided;Falls prevention discussed Falls evaluation completed Falls evaluation completed    FALL RISK PREVENTION PERTAINING TO THE HOME:  Any stairs in or around the home? Yes  If so, are there any without handrails? No  Home free of loose throw rugs in walkways, pet beds, electrical cords, etc? Yes  Adequate lighting in your home to reduce risk of falls? Yes   ASSISTIVE DEVICES UTILIZED TO PREVENT FALLS:  Life alert? No  Use of a cane, walker or w/c? No  Grab bars in the bathroom? No  Shower chair or bench in shower? No  Elevated toilet seat or a handicapped toilet? No   TIMED UP AND GO:  Was the test performed? No . Audio Visit  Cognitive Function:        11/12/2022   10:57 AM 12/01/2021   12:08 PM 11/25/2020   10:28 AM  6CIT Screen  What Year? 0 points 0 points 0 points  What month? 0 points 0 points 0 points  What time? 0 points 0 points 0 points  Count back from 20 0 points 0 points 0 points  Months in reverse 0 points 0 points 0 points  Repeat phrase 0 points 2 points 0 points  Total Score 0 points 2 points 0 points    Immunizations Immunization History  Administered Date(s) Administered   Influenza Split 02/21/2012, 03/14/2016   Influenza, High Dose Seasonal PF 04/19/2017,  03/14/2018, 02/27/2019   Influenza,inj,Quad PF,6+ Mos 02/20/2013, 02/25/2014, 04/08/2015, 05/02/2018   Influenza-Unspecified 03/28/2021   Moderna Sars-Covid-2 Vaccination 07/13/2019, 08/10/2019, 03/07/2020   Pneumococcal Conjugate-13 11/22/2016   Pneumococcal Polysaccharide-23 02/20/2013   Td 06/09/2010   Zoster Recombinat (Shingrix) 02/17/2020   Zoster, Live 11/15/2011   Zoster, Unspecified 07/13/2020    TDAP status: Due, Education has been provided regarding the importance of this vaccine. Advised may receive this vaccine at local pharmacy or Health Dept. Aware to provide a copy of the vaccination record if obtained from local pharmacy or Health Dept. Verbalized acceptance and understanding.  Flu Vaccine status: Up to date  Pneumococcal vaccine status: Due, Education has been provided regarding the importance of this vaccine. Advised may receive this vaccine at local pharmacy or Health Dept. Aware to provide a copy of the vaccination  record if obtained from local pharmacy or Health Dept. Verbalized acceptance and understanding.  Covid-19 vaccine status: Completed vaccines  Qualifies for Shingles Vaccine? Yes   Zostavax completed Yes   Shingrix Completed?: Yes  Screening Tests Health Maintenance  Topic Date Due   FOOT EXAM  Never done   Diabetic kidney evaluation - Urine ACR  Never done   OPHTHALMOLOGY EXAM  01/20/2020   DTaP/Tdap/Td (2 - Tdap) 06/09/2020   COVID-19 Vaccine (4 - 2023-24 season) 11/28/2022 (Originally 02/12/2022)   Colonoscopy  02/12/2023 (Originally 02/23/2021)   Pneumonia Vaccine 60+ Years old (3 of 3 - PPSV23 or PCV20) 11/12/2023 (Originally 11/22/2021)   INFLUENZA VACCINE  01/13/2023   HEMOGLOBIN A1C  02/24/2023   Diabetic kidney evaluation - eGFR measurement  04/06/2023   Medicare Annual Wellness (AWV)  11/12/2023   MAMMOGRAM  01/30/2024   DEXA SCAN  Completed   Hepatitis C Screening  Completed   Zoster Vaccines- Shingrix  Completed   HPV VACCINES  Aged Out     Health Maintenance  Health Maintenance Due  Topic Date Due   FOOT EXAM  Never done   Diabetic kidney evaluation - Urine ACR  Never done   OPHTHALMOLOGY EXAM  01/20/2020   DTaP/Tdap/Td (2 - Tdap) 06/09/2020    Colorectal cancer screening: Referral to GI placed Deferred. Pt aware the office will call re: appt.  Mammogram status: Completed 01/29/22. Repeat every year  Bone Density status: Completed 12/19/16. Results reflect: Bone density results: OSTEOPOROSIS. Repeat every   years.  Lung Cancer Screening: (Low Dose CT Chest recommended if Age 36-80 years, 30 pack-year currently smoking OR have quit w/in 15years.) does not qualify.     Additional Screening:  Hepatitis C Screening: does qualify; Completed 11/22/16  Vision Screening: Recommended annual ophthalmology exams for early detection of glaucoma and other disorders of the eye. Is the patient up to date with their annual eye exam?  Yes  Who is the provider or what is the name of the office in which the patient attends annual eye exams? Dr Virgel Manifold If pt is not established with a provider, would they like to be referred to a provider to establish care? No .   Dental Screening: Recommended annual dental exams for proper oral hygiene  Community Resource Referral / Chronic Care Management:  CRR required this visit?  No   CCM required this visit?  No      Plan:     I have personally reviewed and noted the following in the patient's chart:   Medical and social history Use of alcohol, tobacco or illicit drugs  Current medications and supplements including opioid prescriptions. Patient is not currently taking opioid prescriptions. Functional ability and status Nutritional status Physical activity Advanced directives List of other physicians Hospitalizations, surgeries, and ER visits in previous 12 months Vitals Screenings to include cognitive, depression, and falls Referrals and appointments  In addition, I have  reviewed and discussed with patient certain preventive protocols, quality metrics, and best practice recommendations. A written personalized care plan for preventive services as well as general preventive health recommendations were provided to patient.     Tillie Rung, LPN   1/61/0960   Nurse Notes: Patient due Diabetic kidney evaluation-Urine ACR

## 2022-12-09 ENCOUNTER — Other Ambulatory Visit: Payer: Self-pay | Admitting: Internal Medicine

## 2022-12-22 ENCOUNTER — Other Ambulatory Visit: Payer: Self-pay | Admitting: Internal Medicine

## 2023-01-14 LAB — FECAL OCCULT BLOOD, IMMUNOCHEMICAL: IFOBT: NEGATIVE

## 2023-01-16 ENCOUNTER — Other Ambulatory Visit: Payer: Self-pay | Admitting: Internal Medicine

## 2023-01-18 ENCOUNTER — Other Ambulatory Visit: Payer: Self-pay | Admitting: Internal Medicine

## 2023-01-18 DIAGNOSIS — Z1231 Encounter for screening mammogram for malignant neoplasm of breast: Secondary | ICD-10-CM

## 2023-01-18 NOTE — Progress Notes (Unsigned)
No chief complaint on file.   HPI: Rachel Blair 72 y.o. come in for Chronic disease management  TYpe2 Dm   Moujaro  ROS: See pertinent positives and negatives per HPI.  Past Medical History:  Diagnosis Date   Allergy    Arthritis    CHEST DISCOMFORT 08/30/2007   Qualifier: Diagnosis of  By: Fabian Sharp MD, Neta Mends    Chest pain    atypical   History of pneumonia    Hypertension    Nasal septal deviation    OBSTRUCTIVE SLEEP APNEA 08/30/2007   Qualifier: Diagnosis of  By: Fabian Sharp MD, Neta Mends    Odontogenic cyst    of left maxillary sinus   OSA (obstructive sleep apnea)    moderatley severe, uses CPAP nightly   Trigger thumb of left hand     Family History  Problem Relation Age of Onset   Heart disease Mother    Stroke Father    Heart failure Father    Emphysema Father    Rheum arthritis Daughter    Breast cancer Paternal Aunt    Snoring Brother    Lung cancer Brother     Social History   Socioeconomic History   Marital status: Married    Spouse name: Not on file   Number of children: 3   Years of education: Not on file   Highest education level: 12th grade  Occupational History   Occupation: retired  Tobacco Use   Smoking status: Former    Current packs/day: 0.00    Average packs/day: 1 pack/day for 30.0 years (30.0 ttl pk-yrs)    Types: Cigarettes    Start date: 06/14/1968    Quit date: 06/14/1998    Years since quitting: 24.6   Smokeless tobacco: Never  Vaping Use   Vaping status: Never Used  Substance and Sexual Activity   Alcohol use: Yes    Comment: once a month   Drug use: No   Sexual activity: Yes    Birth control/protection: None  Other Topics Concern   Not on file  Social History Narrative   Married   Regular exercise   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     Ex smoker  Stopped 2000   G3P3   2 cats and a dog          Social  Determinants of Corporate investment banker Strain: Low Risk  (01/18/2023)   Overall Financial Resource Strain (CARDIA)    Difficulty of Paying Living Expenses: Not very hard  Food Insecurity: No Food Insecurity (01/18/2023)   Hunger Vital Sign    Worried About Running Out of Food in the Last Year: Never true    Ran Out of Food in the Last Year: Never true  Transportation Needs: No Transportation Needs (01/18/2023)   PRAPARE - Administrator, Civil Service (Medical): No    Lack of Transportation (Non-Medical): No  Physical Activity: Insufficiently Active (01/18/2023)   Exercise Vital Sign    Days of Exercise per Week: 1 day    Minutes of Exercise per Session: 20 min  Stress: No Stress Concern Present (01/18/2023)   [employer] of Occupational Health - Occupational Stress Questionnaire    Feeling of Stress : Only a little  Social Connections: Unknown (01/18/2023)   Social Connection and Isolation Panel [NHANES]  Frequency of Communication with Friends and Family: More than three times a week    Frequency of Social Gatherings with Friends and Family: Once a week    Attends Religious Services: Patient declined    Database administrator or Organizations: Patient declined    Attends Banker Meetings: Never    Marital Status: Married  Recent Concern: Social Connections - Moderately Isolated (11/12/2022)   Social Connection and Isolation Panel [NHANES]    Frequency of Communication with Friends and Family: More than three times a week    Frequency of Social Gatherings with Friends and Family: More than three times a week    Attends Religious Services: Never    Database administrator or Organizations: No    Attends Banker Meetings: Never    Marital Status: Married    Outpatient Medications Prior to Visit  Medication Sig Dispense Refill   tirzepatide (MOUNJARO) 5 MG/0.5ML Pen Inject 5 mg into the skin once a week. 6 mL 1   ACCU-CHEK GUIDE test strip  USE AS DIRECTED TO TEST BLOOD SUGAR 1 TO 2 TIMES DAILY 50 strip 1   Accu-Chek Softclix Lancets lancets USE 2 LANCETS DAILY 100 each 0   Blood Glucose Monitoring Suppl (ACCU-CHEK GUIDE) w/Device KIT USE UP TO 1-2 TIMES DAILY AS DIRECTED 1 kit 2   cycloSPORINE (RESTASIS) 0.05 % ophthalmic emulsion Place 1 drop into both eyes 2 (two) times daily.     doxycycline (VIBRA-TABS) 100 MG tablet Take 100 mg by mouth 2 (two) times daily.     FARXIGA 5 MG TABS tablet TAKE 1 TABLET(5 MG) BY MOUTH DAILY BEFORE BREAKFAST 90 tablet 1   hydrochlorothiazide (HYDRODIURIL) 25 MG tablet TAKE 1 TABLET(25 MG) BY MOUTH DAILY 90 tablet 0   LORazepam (ATIVAN) 1 MG tablet Take 1 tablet (1 mg total) by mouth every 8 (eight) hours as needed for anxiety. 30 tablet 0   rosuvastatin (CRESTOR) 5 MG tablet TAKE 1 TABLET(5 MG) BY MOUTH DAILY 90 tablet 1   zolpidem (AMBIEN) 10 MG tablet TAKE 1 TABLET(10 MG) BY MOUTH AT BEDTIME AS NEEDED FOR SLEEP 90 tablet 0   No facility-administered medications prior to visit.     EXAM:  There were no vitals taken for this visit.  There is no height or weight on file to calculate BMI.  GENERAL: vitals reviewed and listed above, alert, oriented, appears well hydrated and in no acute distress HEENT: atraumatic, conjunctiva  clear, no obvious abnormalities on inspection of external nose and ears OP : no lesion edema or exudate  NECK: no obvious masses on inspection palpation  LUNGS: clear to auscultation bilaterally, no wheezes, rales or rhonchi, good air movement CV: HRRR, no clubbing cyanosis or  peripheral edema nl cap refill  MS: moves all extremities without noticeable focal  abnormality PSYCH: pleasant and cooperative, no obvious depression or anxiety Lab Results  Component Value Date   WBC 7.6 03/06/2022   HGB 15.5 (H) 03/06/2022   HCT 44.8 03/06/2022   PLT 203 03/06/2022   GLUCOSE 123 (H) 04/05/2022   CHOL 117 02/17/2022   TRIG 203.0 (H) 02/17/2022   HDL 44.70 02/17/2022    LDLDIRECT 52.0 02/17/2022   LDLCALC 41 06/30/2021   ALT 29 03/06/2022   AST 20 03/06/2022   NA 141 04/05/2022   K 3.8 04/05/2022   CL 102 04/05/2022   CREATININE 0.Rachel 04/05/2022   BUN 12 04/05/2022   CO2 27 04/05/2022   TSH 1.84  03/23/2019   HGBA1C 6.6 (A) 08/24/2022   BP Readings from Last 3 Encounters:  08/24/22 124/61  07/13/22 104/76  04/15/22 129/78    ASSESSMENT AND PLAN:  Discussed the following assessment and plan:  Type 2 diabetes mellitus with hyperglycemia, without long-term current use of insulin (HCC)  Medication management  Obesity (BMI 30-39.9) A1c  urine protein  -Patient advised to return or notify health care team  if  new concerns arise.  There are no Patient Instructions on file for this visit.   Neta Mends. Rita Prom M.D.

## 2023-01-19 ENCOUNTER — Other Ambulatory Visit: Payer: Self-pay | Admitting: Internal Medicine

## 2023-01-19 ENCOUNTER — Ambulatory Visit: Payer: Medicare Other | Admitting: Internal Medicine

## 2023-01-19 ENCOUNTER — Encounter: Payer: Self-pay | Admitting: Internal Medicine

## 2023-01-19 VITALS — BP 104/70 | HR 78 | Temp 98.1°F | Ht 65.0 in | Wt 207.8 lb

## 2023-01-19 DIAGNOSIS — Z7985 Long-term (current) use of injectable non-insulin antidiabetic drugs: Secondary | ICD-10-CM | POA: Diagnosis not present

## 2023-01-19 DIAGNOSIS — E1165 Type 2 diabetes mellitus with hyperglycemia: Secondary | ICD-10-CM

## 2023-01-19 DIAGNOSIS — I10 Essential (primary) hypertension: Secondary | ICD-10-CM | POA: Diagnosis not present

## 2023-01-19 DIAGNOSIS — E2839 Other primary ovarian failure: Secondary | ICD-10-CM

## 2023-01-19 DIAGNOSIS — E785 Hyperlipidemia, unspecified: Secondary | ICD-10-CM

## 2023-01-19 DIAGNOSIS — E669 Obesity, unspecified: Secondary | ICD-10-CM

## 2023-01-19 DIAGNOSIS — Z79899 Other long term (current) drug therapy: Secondary | ICD-10-CM | POA: Diagnosis not present

## 2023-01-19 LAB — POCT GLYCOSYLATED HEMOGLOBIN (HGB A1C): Hemoglobin A1C: 5.5 % (ref 4.0–5.6)

## 2023-01-19 LAB — CBC WITH DIFFERENTIAL/PLATELET
Basophils Absolute: 0.1 10*3/uL (ref 0.0–0.1)
Basophils Relative: 0.7 % (ref 0.0–3.0)
Eosinophils Absolute: 0.3 10*3/uL (ref 0.0–0.7)
Eosinophils Relative: 3.9 % (ref 0.0–5.0)
HCT: 48.4 % — ABNORMAL HIGH (ref 36.0–46.0)
Hemoglobin: 16.2 g/dL — ABNORMAL HIGH (ref 12.0–15.0)
Lymphocytes Relative: 38.7 % (ref 12.0–46.0)
Lymphs Abs: 2.8 10*3/uL (ref 0.7–4.0)
MCHC: 33.4 g/dL (ref 30.0–36.0)
MCV: 89.1 fl (ref 78.0–100.0)
Monocytes Absolute: 0.6 10*3/uL (ref 0.1–1.0)
Monocytes Relative: 8.9 % (ref 3.0–12.0)
Neutro Abs: 3.4 10*3/uL (ref 1.4–7.7)
Neutrophils Relative %: 47.8 % (ref 43.0–77.0)
Platelets: 226 10*3/uL (ref 150.0–400.0)
RBC: 5.43 Mil/uL — ABNORMAL HIGH (ref 3.87–5.11)
RDW: 13.9 % (ref 11.5–15.5)
WBC: 7.1 10*3/uL (ref 4.0–10.5)

## 2023-01-19 LAB — HEPATIC FUNCTION PANEL
ALT: 33 U/L (ref 0–35)
AST: 23 U/L (ref 0–37)
Albumin: 4.6 g/dL (ref 3.5–5.2)
Alkaline Phosphatase: 54 U/L (ref 39–117)
Bilirubin, Direct: 0.2 mg/dL (ref 0.0–0.3)
Total Bilirubin: 1.1 mg/dL (ref 0.2–1.2)
Total Protein: 7.1 g/dL (ref 6.0–8.3)

## 2023-01-19 LAB — URINALYSIS, ROUTINE W REFLEX MICROSCOPIC
Bilirubin Urine: NEGATIVE
Hgb urine dipstick: NEGATIVE
Ketones, ur: NEGATIVE
Leukocytes,Ua: NEGATIVE
Nitrite: NEGATIVE
RBC / HPF: NONE SEEN (ref 0–?)
Specific Gravity, Urine: 1.01 (ref 1.000–1.030)
Total Protein, Urine: NEGATIVE
Urine Glucose: >=1000 — AB
Urobilinogen, UA: 0.2 (ref 0.0–1.0)
pH: 8 (ref 5.0–8.0)

## 2023-01-19 LAB — BASIC METABOLIC PANEL
BUN: 14 mg/dL (ref 6–23)
CO2: 32 mEq/L (ref 19–32)
Calcium: 10 mg/dL (ref 8.4–10.5)
Chloride: 99 mEq/L (ref 96–112)
Creatinine, Ser: 0.88 mg/dL (ref 0.40–1.20)
GFR: 66.01 mL/min (ref >60.00)
Glucose, Bld: 95 mg/dL (ref 70–99)
Potassium: 4.2 mEq/L (ref 3.5–5.1)
Sodium: 137 mEq/L (ref 135–145)

## 2023-01-19 LAB — MICROALBUMIN / CREATININE URINE RATIO
Creatinine,U: 118.8 mg/dL
Microalb Creat Ratio: 0.6 mg/g (ref 0.0–30.0)
Microalb, Ur: <0.7 mg/dL (ref 0.0–1.9)

## 2023-01-19 LAB — LIPID PANEL
Cholesterol: 126 mg/dL (ref 0–200)
HDL: 44.7 mg/dL (ref >39.00)
NonHDL: 81.6
Total CHOL/HDL Ratio: 3
Triglycerides: 229 mg/dL — ABNORMAL HIGH (ref 0.0–149.0)
VLDL: 45.8 mg/dL — ABNORMAL HIGH (ref 0.0–40.0)

## 2023-01-19 LAB — LDL CHOLESTEROL, DIRECT: Direct LDL: 56 mg/dL

## 2023-01-19 NOTE — Patient Instructions (Addendum)
Good to see y ou today  A1c is much better 5.5 ! Wt Readings from Last 3 Encounters:  01/19/23 207 lb 12.8 oz (94.3 kg)  11/12/22 207 lb (93.9 kg)  10/07/22 216 lb (98 kg)   Fasting labs today.  Get some muscle exercise resistance  as planned .

## 2023-01-20 ENCOUNTER — Other Ambulatory Visit: Payer: Self-pay

## 2023-01-20 MED ORDER — NITROFURANTOIN MONOHYD MACRO 100 MG PO CAPS: 100.0000 mg | ORAL_CAPSULE | Freq: Two times a day (BID) | ORAL | 0 refills | Status: DC

## 2023-01-20 NOTE — Progress Notes (Signed)
Results stable or normal  except there are bacteria in your urine  We can treat for UTI and then if on going problem  do a urine culture test.  Please send in macrobid 100 twice a day for 7 days disp 14

## 2023-01-24 ENCOUNTER — Other Ambulatory Visit: Payer: Self-pay | Admitting: Family

## 2023-01-24 ENCOUNTER — Telehealth: Payer: Self-pay | Admitting: Internal Medicine

## 2023-01-24 ENCOUNTER — Ambulatory Visit
Admission: RE | Admit: 2023-01-24 | Discharge: 2023-01-24 | Disposition: A | Discharge status: Home / Self Care | Payer: Medicare Other | Source: Ambulatory Visit | Attending: Internal Medicine | Admitting: Internal Medicine

## 2023-01-24 DIAGNOSIS — E2839 Other primary ovarian failure: Secondary | ICD-10-CM

## 2023-01-24 MED ORDER — CEPHALEXIN 500 MG PO CAPS: 500.0000 mg | ORAL_CAPSULE | Freq: Four times a day (QID) | ORAL | 0 refills | Status: DC

## 2023-01-24 NOTE — Telephone Encounter (Signed)
Pt is calling and was seen on 01-19-2023 and she can not take nitrofurantoin, macrocrystal-monohydrate, (MACROBID) 100 MG capsule due to pt had fever, chills, ha and she would like something else . Pt is allergic to this abx [id number] [place], [place] - [address] AT [facility] [place] [address] Phone: [phone]  Fax: [phone]

## 2023-01-26 ENCOUNTER — Encounter: Payer: Self-pay | Admitting: Internal Medicine

## 2023-01-26 NOTE — Progress Notes (Signed)
Normal bond density .

## 2023-02-03 ENCOUNTER — Ambulatory Visit
Admission: RE | Admit: 2023-02-03 | Discharge: 2023-02-03 | Disposition: A | Discharge status: Home / Self Care | Payer: Medicare Other | Source: Ambulatory Visit | Attending: Internal Medicine | Admitting: Internal Medicine

## 2023-02-03 DIAGNOSIS — Z1231 Encounter for screening mammogram for malignant neoplasm of breast: Secondary | ICD-10-CM

## 2023-03-01 ENCOUNTER — Ambulatory Visit (INDEPENDENT_AMBULATORY_CARE_PROVIDER_SITE_OTHER): Payer: Medicare Other | Admitting: Pulmonary Disease

## 2023-03-01 ENCOUNTER — Encounter (HOSPITAL_BASED_OUTPATIENT_CLINIC_OR_DEPARTMENT_OTHER): Payer: Self-pay | Admitting: Pulmonary Disease

## 2023-03-01 VITALS — BP 120/72 | HR 79 | Ht 65.0 in | Wt 201.0 lb

## 2023-03-01 DIAGNOSIS — Z6835 Body mass index (BMI) 35.0-35.9, adult: Secondary | ICD-10-CM

## 2023-03-01 DIAGNOSIS — G4731 Primary central sleep apnea: Secondary | ICD-10-CM

## 2023-03-01 DIAGNOSIS — E669 Obesity, unspecified: Secondary | ICD-10-CM

## 2023-03-01 NOTE — Patient Instructions (Signed)
Machine is working well.  supplies will be renewed for a year

## 2023-03-01 NOTE — Progress Notes (Signed)
Subjective:    Patient ID: Rachel Blair, female    DOB: 12-26-1950, 72 y.o.   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  Annual follow-up visit She wakes up feeling rested, denies sleep pressure in the daytime.  She denies any problems with mask or pressure. She is maintained on ASV settings with EPAP of 7 minimum pressure support 3 cm and maximum pressure support of 10 She report good compliance with her machine  She has lost significant weight from 2 36-2 01 over the last 2 years  Significant tests/ events reviewed   HST 05/2014:  AHI 68/hr, centrals 35/h ASV titration study 07/2014:  Optimal pressure >>  PS 3-15, EPAP 7     Review of Systems neg for any significant sore throat, dysphagia, itching, sneezing, nasal congestion or excess/ purulent secretions, fever, chills, sweats, unintended wt loss, pleuritic or exertional cp, hempoptysis, orthopnea pnd or change in chronic leg swelling. Also denies presyncope, palpitations, heartburn, abdominal pain, nausea, vomiting, diarrhea or change in bowel or urinary habits, dysuria,hematuria, rash, arthralgias, visual complaints, headache, numbness weakness or ataxia.     Objective:   Physical Exam  Gen. Pleasant, obese, in no distress ENT - no lesions, no post nasal drip Neck: No JVD, no thyromegaly, no carotid bruits Lungs: no use of accessory muscles, no dullness to percussion, decreased without rales or rhonchi  Cardiovascular: Rhythm regular, heart sounds  normal, no murmurs or gallops, no peripheral edema Musculoskeletal: No deformities, no cyanosis or clubbing , no tremors       Assessment & Plan:

## 2023-03-01 NOTE — Addendum Note (Signed)
Addended by: Judd Gaudier on: 03/01/2023 12:54 PM   Modules accepted: Orders

## 2023-03-01 NOTE — Assessment & Plan Note (Signed)
She has lost significant weight about 15% and we will consider repeating sleep study next year if she is able to maintain her weight loss

## 2023-03-01 NOTE — Assessment & Plan Note (Signed)
Download was reviewed which shows excellent control of events on ASV settings average pressure 16/7 cm.  She has a nasal mask and minimal leak.  She is very compliant without a single missed night and averaging more than 8 hours per night. Overall ASV has certainly helped improve her daytime somnolence and fatigue Supplies will be renewed for a year Weight loss encouraged, compliance with goal of at least 4-6 hrs every night is the expectation. Advised against medications with sedative side effects Cautioned against driving when sleepy - understanding that sleepiness will vary on a day to day basis

## 2023-04-03 NOTE — Progress Notes (Unsigned)
Cardiology Office Note:   Date:  04/05/2023  ID:  AHILYN GINGRAS, DOB 20-Mar-1951, MRN [id number] PCP:  Madelin Headings, MD  [facility] Providers Cardiologist:  Alverda Skeans, MD Referring MD: Madelin Headings, MD  Chief Complaint/Reason for Referral: Cardiology follow-up ASSESSMENT:    1. Coronary artery calcification seen on CAT scan   2. Aortic atherosclerosis (HCC)   3. Type 2 diabetes mellitus with complication, without long-term current use of insulin (HCC)   4. Hypertension associated with diabetes (HCC)   5. Hyperlipidemia associated with type 2 diabetes mellitus (HCC)   6. CKD (chronic kidney disease) stage 2, GFR 60-89 ml/min   7. BMI 34.0-34.9,adult     PLAN:   In order of problems listed above: Coronary artery calcification: Start aspirin 81 mg daily, continue Crestor 5 mg, and aggressive blood pressure control. Aortic atherosclerosis: See 1 above. Type 2 diabetes: Start aspirin 81 mg daily, continue Crestor 5 mg daily, continue Mounjaro, continue [place].  Intolerant of ARB's due to chest pain.  Start lisinopril 10 mg daily and check BMP next week. Hypertension: Intolerant of ARB's, stop hydrochlorothiazide and start lisinopril 10 mg daily.  Check labs in 1 week Hyperlipidemia: LDL recently was at goal; check LP(a) next week. CKD stage II: Continue Farxiga and start lisinopril for renal protection in diabetic. Elevated BMI: Continue Mounjaro.            Dispo:  Return in about 6 months (around 10/04/2023).      Medication Adjustments/Labs and Tests Ordered: Current medicines are reviewed at length with the patient today.  Concerns regarding medicines are outlined above.  The following changes have been made:     Labs/tests ordered: Orders Placed This Encounter  Procedures   EKG 12-Lead    Medication Changes: No orders of the defined types were placed in this encounter.   Current medicines are reviewed at length with the patient today.  The patient  does not have concerns regarding medicines.  I spent 35 minutes reviewing all clinical data during and prior to this visit including all relevant imaging studies, laboratories, clinical information from other health systems, and prior notes from both Cardiology and other specialties, interviewing the patient, and conducting a complete physical examination in order to formulate a comprehensive and personalized evaluation and treatment plan.  History of Present Illness:      FOCUSED PROBLEM LIST:   Coronary artery calcification  Calcium score CT 2023 Aortic atherosclerosis  Calcium score CT 2023 Type 2 diabetes not on insulin Hypertension Intolerant of ARB due to chest pain Hyperlipidemia BMI 34 CKD stage II Obstructive sleep apnea on CPAP Followed by pulmonary Left anterior fascicular block  October 2024: The patient is here for routine follow-up.  She had last seen cardiology in 2023 for an ER follow-up for chest pain.  The patient was referred for an echocardiogram which demonstrated preserved LV function and calcium score CT which demonstrated elevated coronary artery calcification and aortic atherosclerosis.  Labs were drawn by her PCP in August which demonstrated an LDL of 56 and stage II chronic kidney disease.  The patient is doing well.  She denies any chest pain, shortness of breath, signs or symptoms of stroke, or severe bleeding.  She recently had a tendon release procedure on her left hand performed without complications.  She fortunately has not required any emergency room visits or hospitalizations.  She is tolerating her medical therapy including her Crestor without myalgias or arthralgias.  She started [facility] a  few months ago and is lost about 20 pounds in response to the medication.         Current Medications: Current Meds  Medication Sig   ACCU-CHEK GUIDE test strip USE AS DIRECTED TO TEST BLOOD SUGAR 1 TO 2 TIMES DAILY   Accu-Chek Softclix Lancets lancets USE 2  LANCETS DAILY   Blood Glucose Monitoring Suppl (ACCU-CHEK GUIDE) w/Device KIT USE UP TO 1-2 TIMES DAILY AS DIRECTED   celecoxib (CELEBREX) 100 MG capsule Take by mouth.   cycloSPORINE (RESTASIS) 0.05 % ophthalmic emulsion Place 1 drop into both eyes 2 (two) times daily.   FARXIGA 5 MG TABS tablet TAKE 1 TABLET(5 MG) BY MOUTH DAILY BEFORE BREAKFAST   LORazepam (ATIVAN) 1 MG tablet Take 1 tablet (1 mg total) by mouth every 8 (eight) hours as needed for anxiety.   rosuvastatin (CRESTOR) 5 MG tablet TAKE 1 TABLET(5 MG) BY MOUTH DAILY   tirzepatide (MOUNJARO) 5 MG/0.5ML Pen Inject 5 mg into the skin once a week.   zolpidem (AMBIEN) 10 MG tablet TAKE 1 TABLET(10 MG) BY MOUTH AT BEDTIME AS NEEDED FOR SLEEP   [DISCONTINUED] hydrochlorothiazide (HYDRODIURIL) 25 MG tablet TAKE 1 TABLET(25 MG) BY MOUTH DAILY     Review of Systems:   Please see the history of present illness.    All other systems reviewed and are negative.     EKGs/Labs/Other Test Reviewed:   EKG: EKG performed September 2023 that I personally reviewed demonstrates sinus arrhythmia and left anterior fascicular block  EKG Interpretation Date/Time:  Tuesday April 05 2023 10:40:45 EDT Ventricular Rate:  82 PR Interval:  158 QRS Duration:  82 QT Interval:  396 QTC Calculation: 462 R Axis:   -50  Text Interpretation: Normal sinus rhythm Left anterior fascicular block When compared with ECG of 06-Mar-2022 13:59, Sinus arrhythmia no longer present Confirmed by Alverda Skeans ([id number]) on 04/05/2023 10:48:02 AM         Risk Assessment/Calculations:          Physical Exam:   VS:  BP 124/66   Pulse 82   Ht 5\' 4"  (1.626 m)   Wt 196 lb 3.2 oz (89 kg)   SpO2 96%   BMI 33.68 kg/m        Wt Readings from Last 3 Encounters:  04/05/23 196 lb 3.2 oz (89 kg)  03/01/23 201 lb (91.2 kg)  01/19/23 207 lb 12.8 oz (94.3 kg)      GENERAL:  No apparent distress, AOx3 HEENT:  No carotid bruits, +2 carotid impulses, no scleral  icterus CAR: RRR no murmurs, gallops, rubs, or thrills RES:  Clear to auscultation bilaterally ABD:  Soft, nontender, nondistended, positive bowel sounds x 4 VASC:  +2 radial pulses, +2 carotid pulses NEURO:  CN 2-12 grossly intact; motor and sensory grossly intact PSYCH:  No active depression or anxiety EXT:  No edema, ecchymosis, or cyanosis  Signed, Orbie Pyo, MD  04/05/2023 11:02 AM    [facility] [address] [place], [place], [place]  [address] Phone: [phone]; Fax: [phone]   Note:  This document was prepared using Dragon voice recognition software and may include unintentional dictation errors.

## 2023-04-04 ENCOUNTER — Other Ambulatory Visit: Payer: Self-pay | Admitting: Internal Medicine

## 2023-04-05 ENCOUNTER — Encounter: Payer: Self-pay | Admitting: Internal Medicine

## 2023-04-05 ENCOUNTER — Ambulatory Visit: Payer: Medicare Other | Attending: Internal Medicine | Admitting: Internal Medicine

## 2023-04-05 VITALS — BP 124/66 | HR 82 | Ht 64.0 in | Wt 196.2 lb

## 2023-04-05 DIAGNOSIS — E118 Type 2 diabetes mellitus with unspecified complications: Secondary | ICD-10-CM

## 2023-04-05 DIAGNOSIS — I700 Atherosclerosis of aorta: Secondary | ICD-10-CM

## 2023-04-05 DIAGNOSIS — Z6834 Body mass index (BMI) 34.0-34.9, adult: Secondary | ICD-10-CM

## 2023-04-05 DIAGNOSIS — E1159 Type 2 diabetes mellitus with other circulatory complications: Secondary | ICD-10-CM | POA: Diagnosis not present

## 2023-04-05 DIAGNOSIS — I152 Hypertension secondary to endocrine disorders: Secondary | ICD-10-CM

## 2023-04-05 DIAGNOSIS — E785 Hyperlipidemia, unspecified: Secondary | ICD-10-CM

## 2023-04-05 DIAGNOSIS — I2510 Atherosclerotic heart disease of native coronary artery without angina pectoris: Secondary | ICD-10-CM | POA: Diagnosis not present

## 2023-04-05 DIAGNOSIS — E1169 Type 2 diabetes mellitus with other specified complication: Secondary | ICD-10-CM

## 2023-04-05 DIAGNOSIS — N182 Chronic kidney disease, stage 2 (mild): Secondary | ICD-10-CM

## 2023-04-05 MED ORDER — LISINOPRIL 10 MG PO TABS: 10.0000 mg | ORAL_TABLET | Freq: Every day | ORAL | 3 refills | Status: DC

## 2023-04-05 MED ORDER — ASPIRIN 81 MG PO TBEC
81.0000 mg | DELAYED_RELEASE_TABLET | Freq: Every day | ORAL | Status: AC
Start: 2023-04-05 — End: ?

## 2023-04-05 NOTE — Patient Instructions (Addendum)
Medication Instructions:  Your physician has recommended you make the following change in your medication:   1) STOP hydrochlorothiazide 2) START lisinopril 10 mg daily 3) START aspirin 81 mg daily  *If you need a refill on your cardiac medications before your next appointment, please call your pharmacy*  Lab Work: In 1 week: fasting Lipoprotein A, BMP You may go to any [facility] office between 04/12/23 and 04/15/23. If you have labs (blood work) drawn today and your tests are completely normal, you will receive your results only by: MyChart Message (if you have MyChart) OR A paper copy in the mail If you have any lab test that is abnormal or we need to change your treatment, we will call you to review the results.  Testing/Procedures: None ordered today.  Follow-Up: At [facility], you and your health needs are our priority.  As part of our continuing mission to provide you with exceptional heart care, we have created designated Provider Care Teams.  These Care Teams include your primary Cardiologist (physician) and Advanced Practice Providers (APPs -  Physician Assistants and Nurse Practitioners) who all work together to provide you with the care you need, when you need it.  Your next appointment:   6 month(s)  The format for your next appointment:   In Person  Provider:   Jari Favre, PA-C, Ronie Spies, PA-C, Robin Searing, NP, Jacolyn Reedy, PA-C, Eligha Bridegroom, NP, Tereso Newcomer, PA-C, or Perlie Gold, PA-C

## 2023-04-10 ENCOUNTER — Other Ambulatory Visit: Payer: Self-pay | Admitting: Family

## 2023-04-16 ENCOUNTER — Other Ambulatory Visit: Payer: Self-pay | Admitting: Internal Medicine

## 2023-04-17 ENCOUNTER — Other Ambulatory Visit: Payer: Self-pay | Admitting: Internal Medicine

## 2023-04-17 ENCOUNTER — Encounter: Payer: Self-pay | Admitting: Internal Medicine

## 2023-04-18 ENCOUNTER — Other Ambulatory Visit: Payer: Self-pay

## 2023-04-18 LAB — BASIC METABOLIC PANEL
BUN/Creatinine Ratio: 13 (ref 12–28)
BUN: 13 mg/dL (ref 8–27)
CO2: 24 mmol/L (ref 20–29)
Calcium: 9.7 mg/dL (ref 8.7–10.3)
Chloride: 105 mmol/L (ref 96–106)
Creatinine, Ser: 1.02 mg/dL — ABNORMAL HIGH (ref 0.57–1.00)
Glucose: 99 mg/dL (ref 70–99)
Potassium: 5.6 mmol/L — ABNORMAL HIGH (ref 3.5–5.2)
Sodium: 141 mmol/L (ref 134–144)
eGFR: 59 mL/min/{1.73_m2} — ABNORMAL LOW (ref >59)

## 2023-04-18 MED ORDER — ZOLPIDEM TARTRATE 10 MG PO TABS: ORAL_TABLET | ORAL | 0 refills | Status: DC

## 2023-04-18 MED ORDER — TIRZEPATIDE 5 MG/0.5ML ~~LOC~~ SOAJ: 5.0000 mg | SUBCUTANEOUS | 1 refills | Status: DC

## 2023-04-19 LAB — LIPOPROTEIN A (LPA): Lipoprotein (a): 16.8 nmol/L (ref <75.0)

## 2023-04-20 ENCOUNTER — Telehealth: Payer: Self-pay | Admitting: Internal Medicine

## 2023-04-20 DIAGNOSIS — E1159 Type 2 diabetes mellitus with other circulatory complications: Secondary | ICD-10-CM

## 2023-04-20 NOTE — Telephone Encounter (Signed)
Pt returning nurses call from yesterday regarding lab results. Please advise

## 2023-04-21 NOTE — Telephone Encounter (Signed)
Per documentation pt is not available to speak for the rest of today.  Sent [employer].  The message to her from Dr. Lynnette Caffey has been viewed, she should be holding lisinopril and high potassium foods.  Will need to come in for repeat BMET tomorrow.

## 2023-04-21 NOTE — Telephone Encounter (Signed)
Patient is returning call. Requesting call back. States she is only available the next 30 mins for calls today.

## 2023-04-21 NOTE — Telephone Encounter (Signed)
I spoke w the patient.  She is going to come back for labs today because she will be out of town tomorrow.  She does not want to take the lisinopril anymore and would like to be put back on hydrochlorothiazide.  She said she does not feel herself on the medication and it makes her have "queasy chest pains".  She said she felt the same on losartan.  She understands that it is better for her kidneys but is not wanting to take it anymore.    She has not taken any since Monday.  Adv we would talk again after labs result and Dr. Lynnette Caffey reviews.

## 2023-04-22 LAB — BASIC METABOLIC PANEL
BUN/Creatinine Ratio: 15 (ref 12–28)
BUN: 15 mg/dL (ref 8–27)
CO2: 24 mmol/L (ref 20–29)
Calcium: 9.8 mg/dL (ref 8.7–10.3)
Chloride: 103 mmol/L (ref 96–106)
Creatinine, Ser: 0.97 mg/dL (ref 0.57–1.00)
Glucose: 105 mg/dL — ABNORMAL HIGH (ref 70–99)
Potassium: 4.6 mmol/L (ref 3.5–5.2)
Sodium: 141 mmol/L (ref 134–144)
eGFR: 62 mL/min/{1.73_m2} (ref >59)

## 2023-04-22 MED ORDER — HYDROCHLOROTHIAZIDE 25 MG PO TABS: 25.0000 mg | ORAL_TABLET | Freq: Every day | ORAL | 3 refills | Status: DC

## 2023-04-22 NOTE — Telephone Encounter (Signed)
Okay to stop lisinopirl and restart hydrochlorothiazide per Dr. Lynnette Caffey.  Sent updated prescription to [facility].  Pt aware.

## 2023-04-29 ENCOUNTER — Ambulatory Visit (HOSPITAL_COMMUNITY)
Admission: EM | Admit: 2023-04-29 | Discharge: 2023-04-29 | Disposition: A | Discharge status: Home / Self Care | Payer: Medicare Other | Attending: Emergency Medicine | Admitting: Emergency Medicine

## 2023-04-29 ENCOUNTER — Encounter (HOSPITAL_COMMUNITY): Payer: Self-pay | Admitting: Emergency Medicine

## 2023-04-29 DIAGNOSIS — J019 Acute sinusitis, unspecified: Secondary | ICD-10-CM

## 2023-04-29 MED ORDER — PROMETHAZINE-DM 6.25-15 MG/5ML PO SYRP: 5.0000 mL | ORAL_SOLUTION | Freq: Four times a day (QID) | ORAL | 0 refills | Status: DC | PRN

## 2023-04-29 MED ORDER — FLUTICASONE PROPIONATE 50 MCG/ACT NA SUSP: 2.0000 | Freq: Every day | NASAL | 0 refills | Status: AC

## 2023-04-29 MED ORDER — DOXYCYCLINE HYCLATE 100 MG PO CAPS
100.0000 mg | ORAL_CAPSULE | Freq: Two times a day (BID) | ORAL | 0 refills | Status: AC
Start: 2023-04-29 — End: 2023-05-09

## 2023-04-29 NOTE — ED Provider Notes (Signed)
HPI  SUBJECTIVE:  Rachel Blair is a 72 y.o. female who presents with 1 week of nasal congestion, yellow rhinorrhea, postnasal drip, cough productive of yellow rhinorrhea, headache.  States that she has felt feverish, but has not measured a temperature.  No sinus pain or pressure, facial swelling, upper dental pain, chest pain, wheezing, shortness of breath, dyspnea exertion.  She is unable to sleep at night because of the cough.  She denies double sickening.  No antibiotics in the past 3 months.  No antipyretic in the past 6 hours.  She tried Mucinex and Advil.  Advil helps with the headache.  Her cough is worse with lying down.  Patient has a past medical history of hypertension, diabetes, pneumonia, chronic kidney disease stage II, hypercholesterolemia.  No history of pulmonary disease, smoking.  PCP: [facility].  Past Medical History:  Diagnosis Date   Allergy    Arthritis    CHEST DISCOMFORT 08/30/2007   Qualifier: Diagnosis of  By: Fabian Sharp MD, Neta Mends    Chest pain    atypical   History of pneumonia    Hypertension    Nasal septal deviation    OBSTRUCTIVE SLEEP APNEA 08/30/2007   Qualifier: Diagnosis of  By: Fabian Sharp MD, Neta Mends    Odontogenic cyst    of left maxillary sinus   OSA (obstructive sleep apnea)    moderatley severe, uses CPAP nightly   Trigger thumb of left hand     Past Surgical History:  Procedure Laterality Date   biopsy and bone graft of left maxillary sinus odontogenic cyst     CHOLECYSTECTOMY     left thumb trigger finger release Right    TONSILLECTOMY     TRIGGER FINGER RELEASE Left 05/27/2015   Procedure: RELEASE TRIGGER FINGER/A-1 PULLEY LEFT THUMB;  Surgeon: Cindee Salt, MD;  Location: [facility];  Service: Orthopedics;  Laterality: Left;   TUBAL LIGATION      Family History  Problem Relation Age of Onset   Heart disease Mother    Stroke Father    Heart failure Father    Emphysema Father    Rheum arthritis Daughter    Breast  cancer Paternal Aunt    Snoring Brother    Lung cancer Brother     Social History   Tobacco Use   Smoking status: Former    Current packs/day: 0.00    Average packs/day: 1 pack/day for 30.0 years (30.0 ttl pk-yrs)    Types: Cigarettes    Start date: 06/14/1968    Quit date: 06/14/1998    Years since quitting: 24.8   Smokeless tobacco: Never  Vaping Use   Vaping status: Never Used  Substance Use Topics   Alcohol use: Yes    Comment: once a month   Drug use: No    No current facility-administered medications for this encounter.  Current Outpatient Medications:    doxycycline (VIBRAMYCIN) 100 MG capsule, Take 1 capsule (100 mg total) by mouth 2 (two) times daily for 10 days., Disp: 20 capsule, Rfl: 0   fluticasone (FLONASE) 50 MCG/ACT nasal spray, Place 2 sprays into both nostrils daily., Disp: 16 g, Rfl: 0   promethazine-dextromethorphan (PROMETHAZINE-DM) 6.25-15 MG/5ML syrup, Take 5 mLs by mouth 4 (four) times daily as needed for cough., Disp: 118 mL, Rfl: 0   ACCU-CHEK GUIDE test strip, USE AS DIRECTED TO TEST BLOOD SUGAR 1 TO 2 TIMES DAILY, Disp: 50 strip, Rfl: 1   Accu-Chek Softclix Lancets lancets, USE  2 LANCETS DAILY, Disp: 100 each, Rfl: 0   aspirin EC 81 MG tablet, Take 1 tablet (81 mg total) by mouth daily. Swallow whole., Disp: , Rfl:    Blood Glucose Monitoring Suppl (ACCU-CHEK GUIDE) w/Device KIT, USE UP TO 1-2 TIMES DAILY AS DIRECTED, Disp: 1 kit, Rfl: 2   cycloSPORINE (RESTASIS) 0.05 % ophthalmic emulsion, Place 1 drop into both eyes 2 (two) times daily., Disp: , Rfl:    FARXIGA 5 MG TABS tablet, TAKE 1 TABLET(5 MG) BY MOUTH DAILY BEFORE BREAKFAST, Disp: 90 tablet, Rfl: 1   hydrochlorothiazide (HYDRODIURIL) 25 MG tablet, Take 1 tablet (25 mg total) by mouth daily., Disp: 90 tablet, Rfl: 3   LORazepam (ATIVAN) 1 MG tablet, Take 1 tablet (1 mg total) by mouth every 8 (eight) hours as needed for anxiety., Disp: 30 tablet, Rfl: 0   rosuvastatin (CRESTOR) 5 MG tablet, TAKE 1  TABLET(5 MG) BY MOUTH DAILY, Disp: 90 tablet, Rfl: 1   tirzepatide (MOUNJARO) 5 MG/0.5ML Pen, Inject 5 mg into the skin once a week., Disp: 6 mL, Rfl: 1   zolpidem (AMBIEN) 10 MG tablet, TAKE 1 TABLET(10 MG) BY MOUTH AT BEDTIME AS NEEDED FOR SLEEP, Disp: 90 tablet, Rfl: 0  Allergies  Allergen Reactions   Amphetamine-Dextroamphet Er Other (See Comments)    REACTION: Coming out of skin   Losartan Other (See Comments)    Chest pain   Lisinopril Other (See Comments)    Gave her a "queasy chest pain feeling"   Nitrofurantoin Nausea And Vomiting   Duloxetine Nausea Only    REACTION: Sleepy and nausea   Hydrocodone-Acetaminophen Nausea Only    REACTION: abdominal pain   Metformin And Related Other (See Comments)    Headache   Morphine Sulfate Other (See Comments)    REACTION: abdominal pain     ROS  As noted in HPI.   Physical Exam  BP 137/83 (BP Location: Left Arm)   Pulse (!) 105   Temp 98.7 F (37.1 C) (Oral)   Resp 18   SpO2 95%   Constitutional: Well developed, well nourished, no acute distress Eyes:  EOMI, conjunctiva normal bilaterally HENT: Normocephalic, atraumatic,mucus membranes moist.  Positive purulent nasal congestion.  Normal turbinates.  No maxillary, frontal sinus tenderness.  No obvious postnasal drip. Respiratory: Normal inspiratory effort, lungs clear bilaterally.  Positive lateral chest wall tenderness Cardiovascular: Regular tachycardia, no murmurs rubs or gallops GI: nondistended skin: No rash, skin intact Musculoskeletal: no deformities Neurologic: Alert & oriented x 3, no focal neuro deficits Psychiatric: Speech and behavior appropriate   ED Course   Medications - No data to display  No orders of the defined types were placed in this encounter.   No results found for this or any previous visit (from the past 24 hour(s)). No results found.  ED Clinical Impression  1. Acute non-recurrent sinusitis, unspecified location      ED  Assessment/Plan     Patient presents with acute illness with systemic symptoms of tachycardia.  Patient presents with an upper respiratory infection with subsequent sinusitis.  She has purulent nasal drainage.  Will send home with saline nasal irrigation, Flonase, she is to continue Mucinex, may increase it to 2 pills twice a day.  Promethazine DM.  Wait-and-see prescription of doxycycline for 10 days for sinusitis.  She will start this if not getting better in 2 or 3 days, or if she gets worse.  We discussed getting a chest x-ray as a baseline to evaluate for  pneumonia, but as it would not change management, patient declined x-ray today.  We will try treating her for sinusitis with doxycycline, which will also cover pneumonia, and see how she does.  She will follow-up with her primary care provider or here as needed for imaging.  ER return precautions given.  Discussed  MDM, treatment plan, and plan for follow-up with patient. Discussed sn/sx that should prompt return to the ED. patient agrees with plan.   Meds ordered this encounter  Medications   doxycycline (VIBRAMYCIN) 100 MG capsule    Sig: Take 1 capsule (100 mg total) by mouth 2 (two) times daily for 10 days.    Dispense:  20 capsule    Refill:  0   fluticasone (FLONASE) 50 MCG/ACT nasal spray    Sig: Place 2 sprays into both nostrils daily.    Dispense:  16 g    Refill:  0   promethazine-dextromethorphan (PROMETHAZINE-DM) 6.25-15 MG/5ML syrup    Sig: Take 5 mLs by mouth 4 (four) times daily as needed for cough.    Dispense:  118 mL    Refill:  0      *This clinic note was created using Scientist, clinical (histocompatibility and immunogenetics). Therefore, there may be occasional mistakes despite careful proofreading.  ?     Domenick Gong, MD 04/30/23 1416

## 2023-04-29 NOTE — ED Triage Notes (Signed)
Pt c/o cough, congestion for 1 week. Took at home COVID test that was negative.   She has tried mucinex at home that hasn't helped much.

## 2023-04-29 NOTE — Discharge Instructions (Signed)
You can increase Mucinex to 2 tabs twice a day.  Start saline nasal irrigation with a NeilMed sinus rinse and distilled water as often as you want to help irrigate the infection out of your sinuses.  Flonase will help with the nasal congestion.  Promethazine DM as needed for cough.  Wait-and-see prescription of doxycycline-this will cover sinusitis as well as a pneumonia.  I would wait 2 to 3 days to fill this.  However, if you start to get worse, go ahead and fill it.  Follow-up with your primary care provider as needed, go to the ER if you get worse.

## 2023-05-24 ENCOUNTER — Ambulatory Visit: Payer: Medicare Other | Admitting: Internal Medicine

## 2023-05-24 ENCOUNTER — Encounter: Payer: Self-pay | Admitting: Internal Medicine

## 2023-05-24 VITALS — BP 100/52 | HR 71 | Temp 97.8°F | Ht 64.0 in | Wt 193.0 lb

## 2023-05-24 DIAGNOSIS — I10 Essential (primary) hypertension: Secondary | ICD-10-CM

## 2023-05-24 DIAGNOSIS — E1165 Type 2 diabetes mellitus with hyperglycemia: Secondary | ICD-10-CM

## 2023-05-24 DIAGNOSIS — Z79899 Other long term (current) drug therapy: Secondary | ICD-10-CM | POA: Diagnosis not present

## 2023-05-24 DIAGNOSIS — E669 Obesity, unspecified: Secondary | ICD-10-CM | POA: Diagnosis not present

## 2023-05-24 DIAGNOSIS — Z7984 Long term (current) use of oral hypoglycemic drugs: Secondary | ICD-10-CM

## 2023-05-24 DIAGNOSIS — G4739 Other sleep apnea: Secondary | ICD-10-CM

## 2023-05-24 DIAGNOSIS — Z7985 Long-term (current) use of injectable non-insulin antidiabetic drugs: Secondary | ICD-10-CM

## 2023-05-24 LAB — POCT GLYCOSYLATED HEMOGLOBIN (HGB A1C): Hemoglobin A1C: 5.4 % (ref 4.0–5.6)

## 2023-05-24 LAB — BASIC METABOLIC PANEL
BUN: 13 mg/dL (ref 6–23)
CO2: 32 meq/L (ref 19–32)
Calcium: 9.3 mg/dL (ref 8.4–10.5)
Chloride: 100 meq/L (ref 96–112)
Creatinine, Ser: 0.86 mg/dL (ref 0.40–1.20)
GFR: 67.69 mL/min (ref >60.00)
Glucose, Bld: 95 mg/dL (ref 70–99)
Potassium: 3.8 meq/L (ref 3.5–5.1)
Sodium: 141 meq/L (ref 135–145)

## 2023-05-24 NOTE — Patient Instructions (Addendum)
  A1c ois good  today   Glad you are doing well.   Check bp readings  twice a day for 3-5 days and send in  readings . Decrease the hydrochlorothiazide to 12.5 mg per day  if bp average is 110 and below .  And let us know either way .  We will follow  your potassium level   Stay on mounjaro and farxiga at this time . Same dose .  Stay on crestor  also .Marland Kitchen

## 2023-05-24 NOTE — Progress Notes (Signed)
Chief Complaint  Patient presents with   Medical Management of Chronic Issues    Pt is here for 4 months f/u    HPI: Rachel Blair 72 y.o. come in for Chronic disease management  Sinusitis  [facility]  and rx for trigger finger Last visit pcp 8 24   DM  mounjaro  5 and farxiga and feels well  on this regimen  BP cards new pcp  tried change hydrochlorothiazide to lisinopril but has elevated potassium and stopped   she went back on hydrochlorothiazide since then   no fu chem since then feels did fine on hydrochlorothiazide  denies syncope cp sob dizziness  Sleep complex sleep apnea under care  using ambien  so far benefit more than risk  ROS: See pertinent positives and negatives per HPI.  Past Medical History:  Diagnosis Date   Allergy    Arthritis    CHEST DISCOMFORT 08/30/2007   Qualifier: Diagnosis of  By: Fabian Sharp MD, Neta Mends    Chest pain    atypical   History of pneumonia    Hypertension    Nasal septal deviation    OBSTRUCTIVE SLEEP APNEA 08/30/2007   Qualifier: Diagnosis of  By: Fabian Sharp MD, Neta Mends    Odontogenic cyst    of left maxillary sinus   OSA (obstructive sleep apnea)    moderatley severe, uses CPAP nightly   Trigger thumb of left hand     Family History  Problem Relation Age of Onset   Heart disease Mother    Stroke Father    Heart failure Father    Emphysema Father    Rheum arthritis Daughter    Breast cancer Paternal Aunt    Snoring Brother    Lung cancer Brother     Social History   Socioeconomic History   Marital status: Married    Spouse name: Not on file   Number of children: 3   Years of education: Not on file   Highest education level: 12th grade  Occupational History   Occupation: retired  Tobacco Use   Smoking status: Former    Current packs/day: 0.00    Average packs/day: 1 pack/day for 30.0 years (30.0 ttl pk-yrs)    Types: Cigarettes    Start date: 06/14/1968    Quit date: 06/14/1998    Years since quitting: 24.9    Smokeless tobacco: Never  Vaping Use   Vaping status: Never Used  Substance and Sexual Activity   Alcohol use: Yes    Comment: once a month   Drug use: No   Sexual activity: Yes    Birth control/protection: None  Other Topics Concern   Not on file  Social History Narrative   Married   Regular exercise   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    Working as guardian adlitem     Ex smoker  Stopped 2000   G3P3   2 cats and a dog          Social Determinants of Corporate investment banker Strain: Low Risk  (05/23/2023)   Overall Financial Resource Strain (CARDIA)    Difficulty of Paying Living Expenses: Not hard at all  Food Insecurity: No Food Insecurity (05/23/2023)   Hunger Vital Sign    Worried About Running Out of Food in the Last Year: Never true    Ran Out  of Food in the Last Year: Never true  Transportation Needs: No Transportation Needs (05/23/2023)   PRAPARE - Administrator, Civil Service (Medical): No    Lack of Transportation (Non-Medical): No  Physical Activity: Insufficiently Active (05/23/2023)   Exercise Vital Sign    Days of Exercise per Week: 1 day    Minutes of Exercise per Session: 20 min  Stress: No Stress Concern Present (05/23/2023)   [employer] of Occupational Health - Occupational Stress Questionnaire    Feeling of Stress : Not at all  Social Connections: Unknown (05/23/2023)   Social Connection and Isolation Panel [NHANES]    Frequency of Communication with Friends and Family: More than three times a week    Frequency of Social Gatherings with Friends and Family: Twice a week    Attends Religious Services: Patient declined    Database administrator or Organizations: No    Attends Engineer, structural: Never    Marital Status: Married    Outpatient Medications Prior to Visit  Medication Sig Dispense Refill   ACCU-CHEK GUIDE test strip USE AS DIRECTED TO  TEST BLOOD SUGAR 1 TO 2 TIMES DAILY 50 strip 1   Accu-Chek Softclix Lancets lancets USE 2 LANCETS DAILY 100 each 0   aspirin EC 81 MG tablet Take 1 tablet (81 mg total) by mouth daily. Swallow whole.     Blood Glucose Monitoring Suppl (ACCU-CHEK GUIDE) w/Device KIT USE UP TO 1-2 TIMES DAILY AS DIRECTED 1 kit 2   cycloSPORINE (RESTASIS) 0.05 % ophthalmic emulsion Place 1 drop into both eyes 2 (two) times daily.     FARXIGA 5 MG TABS tablet TAKE 1 TABLET(5 MG) BY MOUTH DAILY BEFORE BREAKFAST 90 tablet 1   fluticasone (FLONASE) 50 MCG/ACT nasal spray Place 2 sprays into both nostrils daily. 16 g 0   hydrochlorothiazide (HYDRODIURIL) 25 MG tablet Take 1 tablet (25 mg total) by mouth daily. 90 tablet 3   rosuvastatin (CRESTOR) 5 MG tablet TAKE 1 TABLET(5 MG) BY MOUTH DAILY 90 tablet 1   tirzepatide (MOUNJARO) 5 MG/0.5ML Pen Inject 5 mg into the skin once a week. 6 mL 1   zolpidem (AMBIEN) 10 MG tablet TAKE 1 TABLET(10 MG) BY MOUTH AT BEDTIME AS NEEDED FOR SLEEP 90 tablet 0   LORazepam (ATIVAN) 1 MG tablet Take 1 tablet (1 mg total) by mouth every 8 (eight) hours as needed for anxiety. (Patient not taking: Reported on 05/24/2023) 30 tablet 0   promethazine-dextromethorphan (PROMETHAZINE-DM) 6.25-15 MG/5ML syrup Take 5 mLs by mouth 4 (four) times daily as needed for cough. 118 mL 0   No facility-administered medications prior to visit.     EXAM:  BP (!) 100/52 (BP Location: Right Arm, Patient Position: Sitting, Cuff Size: Large)   Pulse 71   Temp 97.8 F (36.6 C) (Oral)   Ht 5\' 4"  (1.626 m)   Wt 193 lb (87.5 kg)   SpO2 97%   BMI 33.13 kg/m   Body mass index is 33.13 kg/m. Wt Readings from Last 3 Encounters:  05/24/23 193 lb (87.5 kg)  04/05/23 196 lb 3.2 oz (89 kg)  03/01/23 201 lb (91.2 kg)   BP Readings from Last 3 Encounters:  05/24/23 (!) 100/52  04/29/23 137/83  04/05/23 124/66     GENERAL: vitals reviewed and listed above, alert, oriented, appears well hydrated and in no  acute distress HEENT: atraumatic, conjunctiva  clear, no obvious abnormalities on inspection of external nose and ears ;  NECK: no obvious masses on inspection palpation  LUNGS: clear to auscultation bilaterally, no wheezes, rales or rhonchi, good air movement CV: HRRR, no clubbing cyanosis or  peripheral edema nl cap refill  MS: moves all extremities without noticeable focal  abnormality PSYCH: pleasant and cooperative, no obvious depression or anxiety Lab Results  Component Value Date   WBC 7.1 01/19/2023   HGB 16.2 (H) 01/19/2023   HCT 48.4 (H) 01/19/2023   PLT 226.0 01/19/2023   GLUCOSE 105 (H) 04/21/2023   CHOL 126 01/19/2023   TRIG 229.0 (H) 01/19/2023   HDL 44.70 01/19/2023   LDLDIRECT 56.0 01/19/2023   LDLCALC 41 06/30/2021   ALT 33 01/19/2023   AST 23 01/19/2023   NA 141 04/21/2023   K 4.6 04/21/2023   CL 103 04/21/2023   CREATININE 0.97 04/21/2023   BUN 15 04/21/2023   CO2 24 04/21/2023   TSH 1.84 03/23/2019   HGBA1C 5.4 05/24/2023   MICROALBUR <0.7 01/19/2023   BP Readings from Last 3 Encounters:  05/24/23 (!) 100/52  04/29/23 137/83  04/05/23 124/66    ASSESSMENT AND PLAN:  Discussed the following assessment and plan:  Type 2 diabetes mellitus with hyperglycemia, without long-term current use of insulin (HCC) - a1c down to 5.4 - Plan: POC HgB A1c  Medication management - constipation side effect wth  mounjaro otherwise is pleased with this medication - Plan: Basic Metabolic Panel  Essential hypertension - see  assessment - Plan: Basic Metabolic Panel  Obesity (BMI 30-39.9) - at least 20+ pounds  loss so far on mounjaro and feels well with this.  Complex sleep apnea syndrome Developed hyperkalemia on lisinopril and off hydrochlorothiazide  corrected to 4.6 after off but has been taking hydrochlorothiazide 25 since then since  has been ok in past.     Since bp on low side today and  farxiga other ok for renal protection will  dec hydrochlorothiazide to  12.5 if indeed low out of office  ( suspect related to weight loss and meds )  No hx of chf but higher risk of events  with ct score and dm although now controlled .  Over all feels well on current meds . -Patient advised to return or notify health care team  if  new concerns arise.in interim   Otherwise lab today and fu in about 4 months  Continue attention to healthy habits . Record review  cards and lab and med effect counsel and plan  Patient Instructions   A1c ois good  today   Glad you are doing well.   Check bp readings  twice a day for 3-5 days and send in  readings . Decrease the hydrochlorothiazide to 12.5 mg per day  if bp average is 110 and below .  And let us know either way .  We will follow  your potassium level   Stay on mounjaro and farxiga at this time . Same dose .  Stay on crestor  also .Marland Kitchen   Neta Mends. Zala Degrasse M.D.

## 2023-05-26 NOTE — Progress Notes (Signed)
Potassium acceptable  level continue same plan

## 2023-06-07 ENCOUNTER — Other Ambulatory Visit: Payer: Self-pay | Admitting: Internal Medicine

## 2023-06-09 ENCOUNTER — Other Ambulatory Visit: Payer: Self-pay | Admitting: Internal Medicine

## 2023-06-13 ENCOUNTER — Telehealth: Payer: Self-pay

## 2023-06-19 NOTE — Telephone Encounter (Signed)
 Bp noted in good range   Please make sure  med list is updated  we talked about  decreasing the hydrochlorothiazide to 12.5 mg per day at last visit  confirm that is what she is taking

## 2023-06-23 NOTE — Telephone Encounter (Signed)
 I am fine on what she is taking if feeling ok and bp in range   jsut wanted to make sure  the med list was reflecting  correct dosing  thanks

## 2023-06-23 NOTE — Telephone Encounter (Signed)
 Spoke to pt. Inform to pt of provider's message. Pt states she is feeling fine, no dizzyness. Double check with pt on how the medication is taking. Med list is reflected on how pt is taking the med.   Pt has no further questions.

## 2023-07-22 ENCOUNTER — Encounter: Payer: Self-pay | Admitting: Internal Medicine

## 2023-07-22 ENCOUNTER — Other Ambulatory Visit: Payer: Self-pay | Admitting: Family

## 2023-07-22 MED ORDER — ZOLPIDEM TARTRATE 10 MG PO TABS: ORAL_TABLET | ORAL | 0 refills | Status: DC

## 2023-07-24 ENCOUNTER — Other Ambulatory Visit: Payer: Self-pay | Admitting: Internal Medicine

## 2023-09-21 NOTE — Progress Notes (Unsigned)
 No chief complaint on file.   HPI: 89 T Rachel Blair 73 y.o. come in for Chronic disease management   Last visit 12 24 dm   DM  mounjaro and farxiga  BP Sleep HLD ROS: See pertinent positives and negatives per HPI.  Past Medical History:  Diagnosis Date   Allergy    Arthritis    CHEST DISCOMFORT 08/30/2007   Qualifier: Diagnosis of  By: Fabian Sharp MD, Neta Mends    Chest pain    atypical   History of pneumonia    Hypertension    Nasal septal deviation    OBSTRUCTIVE SLEEP APNEA 08/30/2007   Qualifier: Diagnosis of  By: Fabian Sharp MD, Neta Mends    Odontogenic cyst    of left maxillary sinus   OSA (obstructive sleep apnea)    moderatley severe, uses CPAP nightly   Trigger thumb of left hand     Family History  Problem Relation Age of Onset   Heart disease Mother    Stroke Father    Heart failure Father    Emphysema Father    Rheum arthritis Daughter    Breast cancer Paternal Aunt    Snoring Brother    Lung cancer Brother     Social History   Socioeconomic History   Marital status: Married    Spouse name: Not on file   Number of children: 3   Years of education: Not on file   Highest education level: 12th grade  Occupational History   Occupation: retired  Tobacco Use   Smoking status: Former    Current packs/day: 0.00    Average packs/day: 1 pack/day for 30.0 years (30.0 ttl pk-yrs)    Types: Cigarettes    Start date: 06/14/1968    Quit date: 06/14/1998    Years since quitting: 25.2   Smokeless tobacco: Never  Vaping Use   Vaping status: Never Used  Substance and Sexual Activity   Alcohol use: Yes    Comment: once a month   Drug use: No   Sexual activity: Yes    Birth control/protection: None  Other Topics Concern   Not on file  Social History Narrative   Married   Regular exercise   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     Ex smoker  Stopped 2000   G3P3    2 cats and a dog          Social Drivers of Corporate investment banker Strain: Low Risk  (05/23/2023)   Overall Financial Resource Strain (CARDIA)    Difficulty of Paying Living Expenses: Not hard at all  Food Insecurity: No Food Insecurity (05/23/2023)   Hunger Vital Sign    Worried About Running Out of Food in the Last Year: Never true    Ran Out of Food in the Last Year: Never true  Transportation Needs: No Transportation Needs (05/23/2023)   PRAPARE - Administrator, Civil Service (Medical): No    Lack of Transportation (Non-Medical): No  Physical Activity: Insufficiently Active (05/23/2023)   Exercise Vital Sign    Days of Exercise per Week: 1 day    Minutes of Exercise per Session: 20 min  Stress: No Stress Concern Present (05/23/2023)   [employer] of Occupational Health - Occupational Stress Questionnaire    Feeling of Stress : Not at all  Social  Connections: Unknown (05/23/2023)   Social Connection and Isolation Panel [NHANES]    Frequency of Communication with Friends and Family: More than three times a week    Frequency of Social Gatherings with Friends and Family: Twice a week    Attends Religious Services: Patient declined    Database administrator or Organizations: No    Attends Engineer, structural: Never    Marital Status: Married    Outpatient Medications Prior to Visit  Medication Sig Dispense Refill   ACCU-CHEK GUIDE test strip USE AS DIRECTED TO TEST BLOOD SUGAR 1 TO 2 TIMES DAILY 50 strip 1   Accu-Chek Softclix Lancets lancets USE 2 LANCETS DAILY AS DIRECTED 100 each 0   aspirin EC 81 MG tablet Take 1 tablet (81 mg total) by mouth daily. Swallow whole.     Blood Glucose Monitoring Suppl (ACCU-CHEK GUIDE) w/Device KIT USE TO TEST BLOOD SUGAR 1 TO 2 TIMES DAILY AS DIRECTED 1 kit 0   cycloSPORINE (RESTASIS) 0.05 % ophthalmic emulsion Place 1 drop into both eyes 2 (two) times daily.     FARXIGA 5 MG TABS tablet TAKE 1 TABLET(5 MG) BY  MOUTH DAILY BEFORE BREAKFAST 90 tablet 1   fluticasone (FLONASE) 50 MCG/ACT nasal spray Place 2 sprays into both nostrils daily. 16 g 0   hydrochlorothiazide (HYDRODIURIL) 25 MG tablet Take 1 tablet (25 mg total) by mouth daily. 90 tablet 3   LORazepam (ATIVAN) 1 MG tablet Take 1 tablet (1 mg total) by mouth every 8 (eight) hours as needed for anxiety. (Patient not taking: Reported on 05/24/2023) 30 tablet 0   rosuvastatin (CRESTOR) 5 MG tablet TAKE 1 TABLET(5 MG) BY MOUTH DAILY 90 tablet 1   tirzepatide (MOUNJARO) 5 MG/0.5ML Pen Inject 5 mg into the skin once a week. 6 mL 1   zolpidem (AMBIEN) 10 MG tablet TAKE 1 TABLET(10 MG) BY MOUTH AT BEDTIME AS NEEDED FOR SLEEP 90 tablet 0   No facility-administered medications prior to visit.     EXAM:  There were no vitals taken for this visit.  There is no height or weight on file to calculate BMI. Wt Readings from Last 3 Encounters:  05/24/23 193 lb (87.5 kg)  04/05/23 196 lb 3.2 oz (89 kg)  03/01/23 201 lb (91.2 kg)   BP Readings from Last 3 Encounters:  05/24/23 (!) 100/52  04/29/23 137/83  04/05/23 124/66     GENERAL: vitals reviewed and listed above, alert, oriented, appears well hydrated and in no acute distress HEENT: atraumatic, conjunctiva  clear, no obvious abnormalities on inspection of external nose and ears OP : no lesion edema or exudate  NECK: no obvious masses on inspection palpation  LUNGS: clear to auscultation bilaterally, no wheezes, rales or rhonchi, good air movement CV: HRRR, no clubbing cyanosis or  peripheral edema nl cap refill  MS: moves all extremities without noticeable focal  abnormality PSYCH: pleasant and cooperative, no obvious depression or anxiety Lab Results  Component Value Date   WBC 7.1 01/19/2023   HGB 16.2 (H) 01/19/2023   HCT 48.4 (H) 01/19/2023   PLT 226.0 01/19/2023   GLUCOSE 95 05/24/2023   CHOL 126 01/19/2023   TRIG 229.0 (H) 01/19/2023   HDL 44.70 01/19/2023   LDLDIRECT 56.0  01/19/2023   LDLCALC 41 06/30/2021   ALT 33 01/19/2023   AST 23 01/19/2023   NA 141 05/24/2023   K 3.8 05/24/2023   CL 100 05/24/2023   CREATININE 0.86 05/24/2023  BUN 13 05/24/2023   CO2 32 05/24/2023   TSH 1.84 03/23/2019   HGBA1C 5.4 05/24/2023   MICROALBUR <0.7 01/19/2023   BP Readings from Last 3 Encounters:  05/24/23 (!) 100/52  04/29/23 137/83  04/05/23 124/66    ASSESSMENT AND PLAN:  Discussed the following assessment and plan:  Medication management  -Patient advised to return or notify health care team  if  new concerns arise.  There are no Patient Instructions on file for this visit.   Neta Mends. Lissie Hinesley M.D.

## 2023-09-22 ENCOUNTER — Encounter: Payer: Self-pay | Admitting: Internal Medicine

## 2023-09-22 ENCOUNTER — Other Ambulatory Visit: Payer: Self-pay | Admitting: Internal Medicine

## 2023-09-22 ENCOUNTER — Ambulatory Visit (INDEPENDENT_AMBULATORY_CARE_PROVIDER_SITE_OTHER): Payer: Medicare Other | Admitting: Internal Medicine

## 2023-09-22 VITALS — BP 100/60 | HR 70 | Temp 97.5°F | Ht 64.0 in | Wt 191.0 lb

## 2023-09-22 DIAGNOSIS — Z7984 Long term (current) use of oral hypoglycemic drugs: Secondary | ICD-10-CM

## 2023-09-22 DIAGNOSIS — R200 Anesthesia of skin: Secondary | ICD-10-CM

## 2023-09-22 DIAGNOSIS — E1165 Type 2 diabetes mellitus with hyperglycemia: Secondary | ICD-10-CM | POA: Diagnosis not present

## 2023-09-22 DIAGNOSIS — E669 Obesity, unspecified: Secondary | ICD-10-CM | POA: Diagnosis not present

## 2023-09-22 DIAGNOSIS — Z79899 Other long term (current) drug therapy: Secondary | ICD-10-CM | POA: Diagnosis not present

## 2023-09-22 DIAGNOSIS — Z7985 Long-term (current) use of injectable non-insulin antidiabetic drugs: Secondary | ICD-10-CM

## 2023-09-22 DIAGNOSIS — E785 Hyperlipidemia, unspecified: Secondary | ICD-10-CM

## 2023-09-22 DIAGNOSIS — G4739 Other sleep apnea: Secondary | ICD-10-CM

## 2023-09-22 DIAGNOSIS — I10 Essential (primary) hypertension: Secondary | ICD-10-CM

## 2023-09-22 DIAGNOSIS — G4709 Other insomnia: Secondary | ICD-10-CM

## 2023-09-22 LAB — POCT GLYCOSYLATED HEMOGLOBIN (HGB A1C): Hemoglobin A1C: 5.2 % (ref 4.0–5.6)

## 2023-09-22 NOTE — Patient Instructions (Signed)
 Good to see you today . A1c is 5.2   very good  Continue same meds.  Will plan full blood work in August and then cpe.

## 2023-09-22 NOTE — Progress Notes (Signed)
 Future labs to be done pre cpe in August sept time

## 2023-09-26 ENCOUNTER — Other Ambulatory Visit: Payer: Self-pay | Admitting: Internal Medicine

## 2023-10-14 ENCOUNTER — Emergency Department (HOSPITAL_BASED_OUTPATIENT_CLINIC_OR_DEPARTMENT_OTHER)

## 2023-10-14 ENCOUNTER — Encounter (HOSPITAL_BASED_OUTPATIENT_CLINIC_OR_DEPARTMENT_OTHER): Payer: Self-pay

## 2023-10-14 ENCOUNTER — Other Ambulatory Visit: Payer: Self-pay

## 2023-10-14 ENCOUNTER — Emergency Department (HOSPITAL_BASED_OUTPATIENT_CLINIC_OR_DEPARTMENT_OTHER)
Admission: EM | Admit: 2023-10-14 | Discharge: 2023-10-14 | Disposition: A | Discharge status: Home / Self Care | Attending: Emergency Medicine | Admitting: Emergency Medicine

## 2023-10-14 DIAGNOSIS — I700 Atherosclerosis of aorta: Secondary | ICD-10-CM | POA: Diagnosis not present

## 2023-10-14 DIAGNOSIS — K5730 Diverticulosis of large intestine without perforation or abscess without bleeding: Secondary | ICD-10-CM | POA: Diagnosis not present

## 2023-10-14 DIAGNOSIS — Z7982 Long term (current) use of aspirin: Secondary | ICD-10-CM | POA: Diagnosis not present

## 2023-10-14 DIAGNOSIS — Z9049 Acquired absence of other specified parts of digestive tract: Secondary | ICD-10-CM | POA: Insufficient documentation

## 2023-10-14 DIAGNOSIS — M546 Pain in thoracic spine: Secondary | ICD-10-CM | POA: Insufficient documentation

## 2023-10-14 DIAGNOSIS — M47816 Spondylosis without myelopathy or radiculopathy, lumbar region: Secondary | ICD-10-CM | POA: Diagnosis not present

## 2023-10-14 HISTORY — DX: Type 2 diabetes mellitus without complications: E11.9

## 2023-10-14 LAB — URINALYSIS, ROUTINE W REFLEX MICROSCOPIC
Bacteria, UA: NONE SEEN
Bilirubin Urine: NEGATIVE
Glucose, UA: >1000 mg/dL — AB
Hgb urine dipstick: NEGATIVE
Ketones, ur: NEGATIVE mg/dL
Leukocytes,Ua: NEGATIVE
Nitrite: NEGATIVE
Protein, ur: NEGATIVE mg/dL
Specific Gravity, Urine: 1.012 (ref 1.005–1.030)
pH: 6.5 (ref 5.0–8.0)

## 2023-10-14 MED ORDER — ACETAMINOPHEN 500 MG PO TABS
1000.0000 mg | ORAL_TABLET | Freq: Once | ORAL | Status: AC
  Administered 2023-10-14: 1000 mg via ORAL
  Filled 2023-10-14: qty 2

## 2023-10-14 MED ORDER — OXYCODONE HCL 5 MG PO TABS
5.0000 mg | ORAL_TABLET | Freq: Once | ORAL | Status: AC
  Administered 2023-10-14: 5 mg via ORAL
  Filled 2023-10-14: qty 1

## 2023-10-14 NOTE — ED Triage Notes (Signed)
 Onset week ago mid left of spine back pian.  Getting worse.  Ambulatory to triage slowly.  Took motrin at 1000 hr.

## 2023-10-14 NOTE — Discharge Instructions (Signed)

## 2023-10-14 NOTE — ED Provider Notes (Signed)
 [place] [facility] AT [facility] Provider Note   CSN: [id number] Arrival date & time: 10/14/23  1114     History  Chief Complaint  Patient presents with   Back Pain    Rachel Blair is a 73 y.o. female.  56 yoF with a chief complaint of left thoracic back pain.  Going on for about 3 to 4 days.  She thought originally it was musculoskeletal but does not think it is getting better with ibuprofen.  She took 2 doses this morning.  Was hurting her so bad this morning that she got sweaty.  Denies abdominal pain denies nausea vomiting.  Denies radiation of pain.  Denies urinary symptoms.  Denies trauma.   Back Pain      Home Medications Prior to Admission medications   Medication Sig Start Date End Date Taking? Authorizing Provider  ACCU-CHEK GUIDE test strip USE AS DIRECTED TO TEST BLOOD SUGAR 1 TO 2 TIMES DAILY 09/14/22   Webb, Padonda B, FNP  Accu-Chek Softclix Lancets lancets USE 2 LANCETS DAILY AS DIRECTED 07/25/23   Jarold Merlin B, FNP  aspirin  EC 81 MG tablet Take 1 tablet (81 mg total) by mouth daily. Swallow whole. 04/05/23   Thukkani, Arun K, MD  Blood Glucose Monitoring Suppl (ACCU-CHEK GUIDE) w/Device KIT USE TO TEST BLOOD SUGAR 1 TO 2 TIMES DAILY AS DIRECTED 06/10/23   Webb, Padonda B, FNP  cycloSPORINE (RESTASIS) 0.05 % ophthalmic emulsion Place 1 drop into both eyes 2 (two) times daily.    [provider]  FARXIGA  5 MG TABS tablet TAKE 1 TABLET(5 MG) BY MOUTH DAILY BEFORE BREAKFAST 04/04/23   Webb, Padonda B, FNP  fluticasone  (FLONASE ) 50 MCG/ACT nasal spray Place 2 sprays into both nostrils daily. Patient not taking: Reported on 09/22/2023 04/29/23   Mortenson, Ashley, MD  hydrochlorothiazide  (HYDRODIURIL ) 25 MG tablet Take 1 tablet (25 mg total) by mouth daily. 04/22/23   Thukkani, Arun K, MD  LORazepam  (ATIVAN ) 1 MG tablet Take 1 tablet (1 mg total) by mouth every 8 (eight) hours as needed for anxiety. Patient not taking: Reported on  09/22/2023 12/03/21   Panosh, Wanda K, MD  MOUNJARO  5 MG/0.5ML Pen ADMINISTER 5 MG UNDER THE SKIN 1 TIME A WEEK 09/26/23   Williamson, Joanna R, NP  rosuvastatin  (CRESTOR ) 5 MG tablet TAKE 1 TABLET(5 MG) BY MOUTH DAILY 06/07/23   Panosh, Joaquim Muir, MD  zolpidem  (AMBIEN ) 10 MG tablet TAKE 1 TABLET(10 MG) BY MOUTH AT BEDTIME AS NEEDED FOR SLEEP 07/22/23   Webb, Padonda B, FNP      Allergies    Amphetamine-dextroamphet er, Losartan, Lisinopril , Nitrofurantoin , Duloxetine, Hydrocodone -acetaminophen , Metformin  and related, and Morphine sulfate    Review of Systems   Review of Systems  Musculoskeletal:  Positive for back pain.    Physical Exam Updated Vital Signs BP 135/61   Pulse 68   Temp 98.1 F (36.7 C) (Oral)   Resp 18   Ht 5\' 3"  (1.6 m)   Wt 83.9 kg   SpO2 95%   BMI 32.77 kg/m  Physical Exam Vitals and nursing note reviewed.  Constitutional:      General: She is not in acute distress.    Appearance: She is well-developed. She is not diaphoretic.  HENT:     Head: Normocephalic and atraumatic.  Eyes:     Pupils: Pupils are equal, round, and reactive to light.  Cardiovascular:     Rate and Rhythm: Normal rate and regular rhythm.  Heart sounds: No murmur heard.    No friction rub. No gallop.  Pulmonary:     Effort: Pulmonary effort is normal.     Breath sounds: No wheezing or rales.  Abdominal:     General: There is no distension.     Palpations: Abdomen is soft.     Tenderness: There is no abdominal tenderness.  Musculoskeletal:        General: No tenderness.     Cervical back: Normal range of motion and neck supple.     Comments: Patient points to her left CVA's area discomfort.  No CVA tenderness to percussion.  No obvious reproducible tenderness.  Lordosis.  No obvious midline spinal tenderness step-offs or deformities.  PMS intact to BLE.   Skin:    General: Skin is warm and dry.  Neurological:     Mental Status: She is alert and oriented to person, place, and  time.  Psychiatric:        Behavior: Behavior normal.     ED Results / Procedures / Treatments   Labs (all labs ordered are listed, but only abnormal results are displayed) Labs Reviewed  URINALYSIS, ROUTINE W REFLEX MICROSCOPIC - Abnormal; Notable for the following components:      Result Value   Glucose, UA >1,000 (*)    All other components within normal limits    EKG None  Radiology CT Renal Stone Study Result Date: 10/14/2023 CLINICAL DATA:  Abdominal/flank pain. Left flank pain for 1 week. Stones suspected. EXAM: CT ABDOMEN AND PELVIS WITHOUT CONTRAST TECHNIQUE: Multidetector CT imaging of the abdomen and pelvis was performed following the standard protocol without IV contrast. RADIATION DOSE REDUCTION: This exam was performed according to the departmental dose-optimization program which includes automated exposure control, adjustment of the mA and/or kV according to patient size and/or use of iterative reconstruction technique. COMPARISON:  10/29/2021 FINDINGS: Lower chest: No acute abnormality. Hepatobiliary: No focal liver abnormality is seen. Status post cholecystectomy. No biliary dilatation. Pancreas: Unremarkable. No pancreatic ductal dilatation or surrounding inflammatory changes. Spleen: Normal in size without focal abnormality. Adrenals/Urinary Tract: Normal adrenal glands. No nephrolithiasis or kidney mass identified bilaterally. No hydronephrosis, hydroureter or ureteral calculi. The urinary bladder appears normal. Stomach/Bowel: Stomach is within normal limits. The appendix is visualized and appears normal. No pathologic dilatation of the large or small bowel loops to indicate a bowel obstruction. Colonic diverticulosis identified. No signs of acute diverticulitis. Vascular/Lymphatic: Aortic atherosclerosis. No aneurysm. No abdominopelvic adenopathy. Reproductive: Uterus and bilateral adnexa are unremarkable. Other: There is no free fluid or fluid collections. No signs of free  air. Musculoskeletal: No acute or significant osseous findings. Lumbar spondylosis identified. First degree anterolisthesis of L4 on L5 noted. IMPRESSION: 1. No acute findings within the abdomen or pelvis. 2. No evidence for nephrolithiasis or obstructive uropathy. 3. Colonic diverticulosis without signs of acute diverticulitis. 4. Status post cholecystectomy. 5. Lumbar spondylosis. 6.  Aortic Atherosclerosis (ICD10-I70.0). Electronically Signed   By: Kimberley Penman M.D.   On: 10/14/2023 12:50   CT L-SPINE NO CHARGE Result Date: 10/14/2023 CLINICAL DATA:  Left flank pain for 1 week. EXAM: CT LUMBAR SPINE WITHOUT CONTRAST TECHNIQUE: Multidetector CT imaging of the lumbar spine was performed without intravenous contrast administration. Multiplanar CT image reconstructions were also generated. RADIATION DOSE REDUCTION: This exam was performed according to the departmental dose-optimization program which includes automated exposure control, adjustment of the mA and/or kV according to patient size and/or use of iterative reconstruction technique. COMPARISON:  CT stone study  10/29/2021.  Lumbar spine MRI 11/18/2019 FINDINGS: Segmentation: 5 lumbar type vertebrae. Alignment: Mild anterolisthesis of L4 on 5 is stable since prior MRI of 11/18/2019. Trace retrolisthesis of L3 on 4 likely due to disc and facet degeneration. Vertebrae: No evidence for an acute fracture. No worrisome lytic or sclerotic osseous abnormality. Paraspinal and other soft tissues: Negative. Disc levels: T12-L1: No central canal or foraminal stenosis. L1-2: Mild loss of disc height. Broad-based shallow chronic disc osteophyte complex. No substantial central canal or bony foraminal encroachment. L2-3: Loss of disc height with posterior broad-based bulging disc an overhanging posterior osteophyte. Bilateral facet degeneration. No substantial central canal stenosis. Mild right foraminal encroachment due to the facet degeneration. L3-4: Posterior  broad-based disc osteophyte complex with thickening of ligamentum flavum and bilateral facet overgrowth. Moderate central canal stenosis with right greater than left foraminal encroachment due to endplate spurring and facet hypertrophy. L4-5: Posterior broad-based bulging disc is associated with marked facet hypertrophy/spurring in thickening of the ligamentum flavum, similar to prior MRI. At least moderate multifactorial central canal stenosis evident. Right greater than left foraminal encroachment secondary to facet overgrowth and uncinate spurring. L5-S1: Posterior broad-based bulging disc. No central canal stenosis. Mild bilateral foraminal encroachment. IMPRESSION: 1. No acute bony findings in the lumbar spine. 2. 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 3. Multilevel foraminal encroachment, greatest on the right at L3-4 and L4-5. 4. MRI of the lumbar spine may prove helpful to further evaluate as clinically warranted. Electronically Signed   By: Donnal Fusi M.D.   On: 10/14/2023 12:48    Procedures Procedures    Medications Ordered in ED Medications  acetaminophen  (TYLENOL ) tablet 1,000 mg (1,000 mg Oral Given 10/14/23 1143)  oxyCODONE  (Oxy IR/ROXICODONE ) immediate release tablet 5 mg (5 mg Oral Given 10/14/23 1143)    ED Course/ Medical Decision Making/ A&P                                 Medical Decision Making Amount and/or Complexity of Data Reviewed Labs: ordered. Radiology: ordered.  Risk OTC drugs. Prescription drug management.   73 yo F with a chief complaint of left flank pain.  Going on for couple days.  Most likely musculoskeletal by history and physical.  She is complaining was so severe this morning that she became diaphoretic.  Will obtain CT imaging to assess for intra-abdominal pathology.  Reassess.   UA negative for infection.  CTA abdomen pelvis without obvious intra-abdominal pathology.  No obvious acute  intraspinal finding.  Will treat as musculoskeletal.  PCP follow-up.  2:27 PM:  I have discussed the diagnosis/risks/treatment options with the patient.  Evaluation and diagnostic testing in the emergency department does not suggest an emergent condition requiring admission or immediate intervention beyond what has been performed at this time.  They will follow up with PCP. We also discussed returning to the ED immediately if new or worsening sx occur. We discussed the sx which are most concerning (e.g., sudden worsening pain, fever, inability to tolerate by mouth, cauda equina s/sx) that necessitate immediate return. Medications administered to the patient during their visit and any new prescriptions provided to the patient are listed below.  Medications given during this visit Medications  acetaminophen  (TYLENOL ) tablet 1,000 mg (1,000 mg Oral Given 10/14/23 1143)  oxyCODONE  (Oxy IR/ROXICODONE ) immediate release tablet 5 mg (5 mg Oral Given 10/14/23 1143)  The patient appears reasonably screen and/or stabilized for discharge and I doubt any other medical condition or other [facility] requiring further screening, evaluation, or treatment in the ED at this time prior to discharge.         Final Clinical Impression(s) / ED Diagnoses Final diagnoses:  Acute left-sided thoracic back pain    Rx / DC Orders ED Discharge Orders     None         Albertus Hughs, DO 10/14/23 1427

## 2023-10-17 ENCOUNTER — Ambulatory Visit: Admitting: Family Medicine

## 2023-10-18 ENCOUNTER — Ambulatory Visit: Admitting: Family Medicine

## 2023-10-21 ENCOUNTER — Encounter: Payer: Self-pay | Admitting: Internal Medicine

## 2023-10-22 ENCOUNTER — Other Ambulatory Visit: Payer: Self-pay | Admitting: Family

## 2023-10-25 ENCOUNTER — Ambulatory Visit: Admitting: Internal Medicine

## 2023-10-25 ENCOUNTER — Other Ambulatory Visit: Payer: Self-pay | Admitting: Family

## 2023-10-25 MED ORDER — ZOLPIDEM TARTRATE 10 MG PO TABS: ORAL_TABLET | ORAL | 0 refills | Status: DC

## 2023-11-09 ENCOUNTER — Encounter: Payer: Self-pay | Admitting: Internal Medicine

## 2023-11-09 ENCOUNTER — Telehealth (INDEPENDENT_AMBULATORY_CARE_PROVIDER_SITE_OTHER): Admitting: Internal Medicine

## 2023-11-09 VITALS — Wt 180.0 lb

## 2023-11-09 DIAGNOSIS — L237 Allergic contact dermatitis due to plants, except food: Secondary | ICD-10-CM

## 2023-11-09 MED ORDER — PREDNISONE 20 MG PO TABS: 20.0000 mg | ORAL_TABLET | Freq: Every day | ORAL | 0 refills | Status: AC

## 2023-11-09 NOTE — Progress Notes (Signed)
 Virtual Visit via Video Note  I connected with Rachel Blair on 11/09/23 at  4:00 PM EDT by a video enabled telemedicine application and verified that I am speaking with the correct person using two identifiers.  Location patient: home Location provider: work office Persons participating in the virtual visit: patient, provider  I discussed the limitations of evaluation and management by telemedicine and the availability of in person appointments. The patient expressed understanding and agreed to proceed.   HPI: She is scheduled this visit to discuss a rash.  She was weeding in her yard, she is known to have poison ivy and [place]  creeper.  She had a rash appearing over her right hand and over her right cheek.  She believes she might have scratched her right cheek with her hand.  It is very pruritic.   ROS: Negative unless indicated in HPI.  Past Medical History:  Diagnosis Date   Allergy    Arthritis    CHEST DISCOMFORT 08/30/2007   Qualifier: Diagnosis of  By: Ethel Henry MD, Joaquim Muir    Chest pain    atypical   Diabetes mellitus without complication (HCC)    History of pneumonia    Hypertension    Nasal septal deviation    OBSTRUCTIVE SLEEP APNEA 08/30/2007   Qualifier: Diagnosis of  By: Ethel Henry MD, Joaquim Muir    Odontogenic cyst    of left maxillary sinus   OSA (obstructive sleep apnea)    moderatley severe, uses CPAP nightly   Trigger thumb of left hand     Past Surgical History:  Procedure Laterality Date   biopsy and bone graft of left maxillary sinus odontogenic cyst     CHOLECYSTECTOMY     left thumb trigger finger release Right    TONSILLECTOMY     TRIGGER FINGER RELEASE Left 05/27/2015   Procedure: RELEASE TRIGGER FINGER/A-1 PULLEY LEFT THUMB;  Surgeon: Lyanne Sample, MD;  Location: [facility];  Service: Orthopedics;  Laterality: Left;   TUBAL LIGATION      Family History  Problem Relation Age of Onset   Heart disease Mother    Stroke Father     Heart failure Father    Emphysema Father    Rheum arthritis Daughter    Breast cancer Paternal Aunt    Snoring Brother    Lung cancer Brother     SOCIAL HX:   reports that she quit smoking about 25 years ago. Her smoking use included cigarettes. She started smoking about 55 years ago. She has a 30 pack-year smoking history. She has never used smokeless tobacco. She reports that she does not currently use alcohol. She reports that she does not use drugs.   Current Outpatient Medications:    ACCU-CHEK GUIDE test strip, USE AS DIRECTED TO TEST BLOOD SUGAR 1 TO 2 TIMES DAILY, Disp: 50 strip, Rfl: 1   Accu-Chek Softclix Lancets lancets, USE 2 LANCETS DAILY AS DIRECTED, Disp: 100 each, Rfl: 0   aspirin  EC 81 MG tablet, Take 1 tablet (81 mg total) by mouth daily. Swallow whole., Disp: , Rfl:    Blood Glucose Monitoring Suppl (ACCU-CHEK GUIDE) w/Device KIT, USE TO TEST BLOOD SUGAR 1 TO 2 TIMES DAILY AS DIRECTED, Disp: 1 kit, Rfl: 0   cycloSPORINE (RESTASIS) 0.05 % ophthalmic emulsion, Place 1 drop into both eyes 2 (two) times daily., Disp: , Rfl:    FARXIGA  5 MG TABS tablet, TAKE 1 TABLET(5 MG) BY MOUTH DAILY BEFORE BREAKFAST, Disp: 90  tablet, Rfl: 1   fluticasone  (FLONASE ) 50 MCG/ACT nasal spray, Place 2 sprays into both nostrils daily., Disp: 16 g, Rfl: 0   hydrochlorothiazide  (HYDRODIURIL ) 25 MG tablet, Take 1 tablet (25 mg total) by mouth daily., Disp: 90 tablet, Rfl: 3   LORazepam  (ATIVAN ) 1 MG tablet, Take 1 tablet (1 mg total) by mouth every 8 (eight) hours as needed for anxiety., Disp: 30 tablet, Rfl: 0   MOUNJARO  5 MG/0.5ML Pen, ADMINISTER 5 MG UNDER THE SKIN 1 TIME A WEEK, Disp: 6 mL, Rfl: 1   predniSONE  (DELTASONE ) 20 MG tablet, Take 1 tablet (20 mg total) by mouth daily with breakfast for 3 days., Disp: 3 tablet, Rfl: 0   rosuvastatin  (CRESTOR ) 5 MG tablet, TAKE 1 TABLET(5 MG) BY MOUTH DAILY, Disp: 90 tablet, Rfl: 1   zolpidem  (AMBIEN ) 10 MG tablet, TAKE 1 TABLET(10 MG) BY MOUTH AT  BEDTIME AS NEEDED FOR SLEEP, Disp: 90 tablet, Rfl: 0  EXAM:   VITALS per patient if applicable: None reported  GENERAL: alert, oriented, appears well and in no acute distress  HEENT: atraumatic, conjunttiva clear, no obvious abnormalities on inspection of external nose and ears  NECK: normal movements of the head and neck  LUNGS: on inspection no signs of respiratory distress, breathing rate appears normal, no obvious gross increased work of breathing, gasping or wheezing  CV: no obvious cyanosis  MS: moves all visible extremities without noticeable abnormality  PSYCH/NEURO: pleasant and cooperative, no obvious depression or anxiety, speech and thought processing grossly intact  ASSESSMENT AND PLAN:   Poison ivy dermatitis - Plan: predniSONE  (DELTASONE ) 20 MG tablet  -This appears to be classic poison ivy dermatitis.  Treat with prednisone  20 mg for 3 days.   I discussed the assessment and treatment plan with the patient. The patient was provided an opportunity to ask questions and all were answered. The patient agreed with the plan and demonstrated an understanding of the instructions.   The patient was advised to call back or seek an in-person evaluation if the symptoms worsen or if the condition fails to improve as anticipated.    Marguerita Shih, MD  [facility] at [facility]

## 2023-11-09 NOTE — Progress Notes (Signed)
 Per patient no change in vitals since last visit, unable to obtain new vitals due to telehealth visit.

## 2023-11-14 ENCOUNTER — Telehealth: Payer: Self-pay | Admitting: *Deleted

## 2023-11-14 NOTE — Telephone Encounter (Signed)
 Copied from CRM [id number]. Topic: General - Other >> Nov 14, 2023  8:15 AM Annelle Kiel wrote: Reason for CRM: patient is needing a callback regarding her apt with dr banks she also stated that the poison ivy is not improving at all she also stated she received a text at 1am regarding information from the office she did not like that at all she stated

## 2023-11-15 NOTE — Telephone Encounter (Signed)
 Spoke with patient and she is currently at the dermatology office.

## 2023-11-15 NOTE — Telephone Encounter (Signed)
 Spoke to pt. Pt states she will follow up with her dermatology in regards to her rash.   Pt states she is not sure why her cpe appt is schedule with Dr. Arliss Lam when her pcp is Dr. Ethel Henry. Appt changed. No further action is needed at this time.

## 2023-11-17 LAB — FECAL OCCULT BLOOD, IMMUNOCHEMICAL: IFOBT: NEGATIVE

## 2023-11-21 ENCOUNTER — Ambulatory Visit (INDEPENDENT_AMBULATORY_CARE_PROVIDER_SITE_OTHER): Payer: Medicare Other

## 2023-11-21 VITALS — Ht 64.0 in | Wt 191.0 lb

## 2023-11-21 DIAGNOSIS — Z0000 Encounter for general adult medical examination without abnormal findings: Secondary | ICD-10-CM | POA: Diagnosis not present

## 2023-11-21 NOTE — Progress Notes (Signed)
 Subjective:   Rachel Blair is a 73 y.o. who presents for a Medicare Wellness preventive visit.  As a reminder, Annual Wellness Visits don't include a physical exam, and some assessments may be limited, especially if this visit is performed virtually. We may recommend an in-person follow-up visit with your provider if needed.  Visit Complete: Virtual I connected with  Brecklyn T Beza on 11/21/23 by a audio enabled telemedicine application and verified that I am speaking with the correct person using two identifiers.  Patient Location: Home  Provider Location: Home Office  I discussed the limitations of evaluation and management by telemedicine. The patient expressed understanding and agreed to proceed.  Vital Signs: Because this visit was a virtual/telehealth visit, some criteria may be missing or patient reported. Any vitals not documented were not able to be obtained and vitals that have been documented are patient reported.    Persons Participating in Visit: Patient.  AWV Questionnaire: No: Patient Medicare AWV questionnaire was not completed prior to this visit.  Cardiac Risk Factors include: advanced age (>28men, >92 women);diabetes mellitus;hypertension     Objective:     Today's Vitals   11/21/23 1046  Weight: 191 lb (86.6 kg)  Height: 5\' 4"  (1.626 m)   Body mass index is 32.79 kg/m.     11/21/2023   10:52 AM 11/12/2022   10:57 AM 12/01/2021   12:05 PM 11/25/2020   10:25 AM 05/27/2015    9:01 AM 05/23/2015   10:46 AM 07/18/2014    8:13 PM  Advanced Directives  Does Patient Have a Medical Advance Directive? No No No No No No No  Would patient like information on creating a medical advance directive? No - Patient declined No - Patient declined  Yes (MAU/Ambulatory/Procedural Areas - Information given) No - patient declined information  No - patient declined information    Current Medications (verified) Outpatient Encounter Medications as of 11/21/2023  Medication Sig    ACCU-CHEK GUIDE test strip USE AS DIRECTED TO TEST BLOOD SUGAR 1 TO 2 TIMES DAILY   Accu-Chek Softclix Lancets lancets USE 2 LANCETS DAILY AS DIRECTED   aspirin  EC 81 MG tablet Take 1 tablet (81 mg total) by mouth daily. Swallow whole.   Blood Glucose Monitoring Suppl (ACCU-CHEK GUIDE) w/Device KIT USE TO TEST BLOOD SUGAR 1 TO 2 TIMES DAILY AS DIRECTED   cycloSPORINE (RESTASIS) 0.05 % ophthalmic emulsion Place 1 drop into both eyes 2 (two) times daily.   FARXIGA  5 MG TABS tablet TAKE 1 TABLET(5 MG) BY MOUTH DAILY BEFORE BREAKFAST   fluticasone  (FLONASE ) 50 MCG/ACT nasal spray Place 2 sprays into both nostrils daily.   hydrochlorothiazide  (HYDRODIURIL ) 25 MG tablet Take 1 tablet (25 mg total) by mouth daily.   LORazepam  (ATIVAN ) 1 MG tablet Take 1 tablet (1 mg total) by mouth every 8 (eight) hours as needed for anxiety.   MOUNJARO  5 MG/0.5ML Pen ADMINISTER 5 MG UNDER THE SKIN 1 TIME A WEEK   rosuvastatin  (CRESTOR ) 5 MG tablet TAKE 1 TABLET(5 MG) BY MOUTH DAILY   zolpidem  (AMBIEN ) 10 MG tablet TAKE 1 TABLET(10 MG) BY MOUTH AT BEDTIME AS NEEDED FOR SLEEP   No facility-administered encounter medications on file as of 11/21/2023.    Allergies (verified) Amphetamine-dextroamphet er, Losartan, Lisinopril , Nitrofurantoin , Duloxetine, Hydrocodone -acetaminophen , Metformin  and related, and Morphine sulfate   History: Past Medical History:  Diagnosis Date   Allergy    Arthritis    CHEST DISCOMFORT 08/30/2007   Qualifier: Diagnosis of  By: Ethel Henry  MD, Joaquim Muir    Chest pain    atypical   Diabetes mellitus without complication (HCC)    History of pneumonia    Hypertension    Nasal septal deviation    OBSTRUCTIVE SLEEP APNEA 08/30/2007   Qualifier: Diagnosis of  By: Ethel Henry MD, Joaquim Muir    Odontogenic cyst    of left maxillary sinus   OSA (obstructive sleep apnea)    moderatley severe, uses CPAP nightly   Trigger thumb of left hand    Past Surgical History:  Procedure Laterality Date   biopsy  and bone graft of left maxillary sinus odontogenic cyst     CHOLECYSTECTOMY     left thumb trigger finger release Right    TONSILLECTOMY     TRIGGER FINGER RELEASE Left 05/27/2015   Procedure: RELEASE TRIGGER FINGER/A-1 PULLEY LEFT THUMB;  Surgeon: Lyanne Sample, MD;  Location: [facility];  Service: Orthopedics;  Laterality: Left;   TUBAL LIGATION     Family History  Problem Relation Age of Onset   Heart disease Mother    Stroke Father    Heart failure Father    Emphysema Father    Rheum arthritis Daughter    Breast cancer Paternal Aunt    Snoring Brother    Lung cancer Brother    Social History   Socioeconomic History   Marital status: Married    Spouse name: Not on file   Number of children: 3   Years of education: Not on file   Highest education level: 12th grade  Occupational History   Occupation: retired  Tobacco Use   Smoking status: Former    Current packs/day: 0.00    Average packs/day: 1 pack/day for 30.0 years (30.0 ttl pk-yrs)    Types: Cigarettes    Start date: 06/14/1968    Quit date: 06/14/1998    Years since quitting: 25.4   Smokeless tobacco: Never  Vaping Use   Vaping status: Never Used  Substance and Sexual Activity   Alcohol use: Not Currently    Comment: once a month   Drug use: No   Sexual activity: Yes    Birth control/protection: None  Other Topics Concern   Not on file  Social History Narrative   Married   Regular exercise   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     Ex smoker  Stopped 2000   G3P3   2 cats and a dog          Social Drivers of Corporate investment banker Strain: Low Risk  (11/21/2023)   Overall Financial Resource Strain (CARDIA)    Difficulty of Paying Living Expenses: Not hard at all  Food Insecurity: No Food Insecurity (11/21/2023)   Hunger Vital Sign    Worried About Running Out of Food in the Last Year:  Never true    Ran Out of Food in the Last Year: Never true  Transportation Needs: No Transportation Needs (11/21/2023)   PRAPARE - Administrator, Civil Service (Medical): No    Lack of Transportation (Non-Medical): No  Physical Activity: Inactive (11/21/2023)   Exercise Vital Sign    Days of Exercise per Week: 0 days    Minutes of Exercise per Session: 0 min  Stress: No Stress Concern Present (11/21/2023)   [employer] of Occupational Health - Occupational  Stress Questionnaire    Feeling of Stress : Not at all  Social Connections: Socially Integrated (11/21/2023)   Social Connection and Isolation Panel [NHANES]    Frequency of Communication with Friends and Family: More than three times a week    Frequency of Social Gatherings with Friends and Family: More than three times a week    Attends Religious Services: More than 4 times per year    Active Member of [employer] or Organizations: Yes    Attends Engineer, structural: More than 4 times per year    Marital Status: Married    Tobacco Counseling Counseling given: Not Answered    Clinical Intake:  Pre-visit preparation completed: Yes  Pain : No/denies pain     BMI - recorded: 32.79 Nutritional Status: BMI > 30  Obese Nutritional Risks: None Diabetes: Yes CBG done?: No Did pt. bring in CBG monitor from home?: No  Lab Results  Component Value Date   HGBA1C 5.2 09/22/2023   HGBA1C 5.4 05/24/2023   HGBA1C 5.5 01/19/2023     How often do you need to have someone help you when you read instructions, pamphlets, or other written materials from your doctor or pharmacy?: 1 - Never  Interpreter Needed?: No  Information entered by :: Farris Hong LPN   Activities of Daily Living     11/21/2023   10:51 AM  In your present state of health, do you have any difficulty performing the following activities:  Hearing? 0  Vision? 0  Difficulty concentrating or making decisions? 0  Walking or climbing stairs? 0   Dressing or bathing? 0  Doing errands, shopping? 0  Preparing Food and eating ? N  Using the Toilet? N  In the past six months, have you accidently leaked urine? N  Do you have problems with loss of bowel control? N  Managing your Medications? N  Managing your Finances? N  Housekeeping or managing your Housekeeping? N    Patient Care Team: Panosh, Joaquim Muir, MD as PCP - General (Internal Medicine) Thukkani, Arun K, MD as PCP - Cardiology (Cardiology) Lind Repine, MD as Consulting Physician (Pulmonary Disease) Ronni Colace, MD as Consulting Physician (Ophthalmology)  I have updated your Care Teams any recent Medical Services you may have received from other providers in the past year.     Assessment:    This is a routine wellness examination for Jazaria.  Hearing/Vision screen Hearing Screening - Comments:: Denies hearing difficulties   Vision Screening - Comments:: Wears rx glasses - up to date with routine eye exams with  Dr Lenna Quinton   Goals Addressed               This Visit's Progress     Increase physical activity (pt-stated)        Remain Active       Depression Screen     11/21/2023   10:50 AM 11/09/2023    3:20 PM 11/12/2022   10:55 AM 07/13/2022    2:02 PM 12/03/2021    8:25 AM 12/01/2021   12:06 PM 10/29/2021    9:10 AM  PHQ 2/9 Scores  PHQ - 2 Score 0 0 0 0 4 0 0  PHQ- 9 Score     4  0    Fall Risk     11/21/2023   10:51 AM 11/09/2023    3:20 PM 01/18/2023    5:06 PM 11/12/2022   10:57 AM 07/13/2022    2:02 PM  Fall  Risk   Falls in the past year? 0 0 0 0 0  Number falls in past yr: 0 0  0 0  Injury with Fall? 0 0  0 0  Risk for fall due to : No Fall Risks   No Fall Risks Other (Comment)  Follow up Falls evaluation completed Falls evaluation completed  Falls prevention discussed Falls evaluation completed    MEDICARE RISK AT HOME:  Medicare Risk at Home Any stairs in or around the home?: Yes If so, are there any without handrails?: No Home free of  loose throw rugs in walkways, pet beds, electrical cords, etc?: Yes Adequate lighting in your home to reduce risk of falls?: Yes Life alert?: No Use of a cane, walker or w/c?: No Grab bars in the bathroom?: No Shower chair or bench in shower?: No Elevated toilet seat or a handicapped toilet?: No  TIMED UP AND GO:  Was the test performed?  No  Cognitive Function: 6CIT completed        11/21/2023   10:52 AM 11/12/2022   10:57 AM 12/01/2021   12:08 PM 11/25/2020   10:28 AM  6CIT Screen  What Year? 0 points 0 points 0 points 0 points  What month? 0 points 0 points 0 points 0 points  What time? 0 points 0 points 0 points 0 points  Count back from 20 0 points 0 points 0 points 0 points  Months in reverse 0 points 0 points 0 points 0 points  Repeat phrase 0 points 0 points 2 points 0 points  Total Score 0 points 0 points 2 points 0 points    Immunizations Immunization History  Administered Date(s) Administered   Influenza Split 02/21/2012, 03/14/2016   Influenza, High Dose Seasonal PF 04/19/2017, 03/14/2018, 02/27/2019, 04/04/2023   Influenza,inj,Quad PF,6+ Mos 02/20/2013, 02/25/2014, 04/08/2015, 05/02/2018   Influenza-Unspecified 03/28/2021, 05/30/2022   Moderna Sars-Covid-2 Vaccination 07/13/2019, 08/10/2019, 03/07/2020   Pneumococcal Conjugate-13 11/22/2016   Pneumococcal Polysaccharide-23 02/20/2013   Td 06/09/2010   Zoster Recombinant(Shingrix) 02/17/2020   Zoster, Live 11/15/2011   Zoster, Unspecified 07/13/2020    Screening Tests Health Maintenance  Topic Date Due   FOOT EXAM  Never done   Pneumonia Vaccine 44+ Years old (3 of 3 - PCV20 or PCV21) 11/22/2021   COVID-19 Vaccine (4 - 2024-25 season) 02/13/2023   Colonoscopy  11/22/2023 (Originally 02/23/2021)   OPHTHALMOLOGY EXAM  01/12/2024 (Originally 01/20/2020)   DTaP/Tdap/Td (2 - Tdap) 06/23/2024 (Originally 06/09/2020)   INFLUENZA VACCINE  01/13/2024   Diabetic kidney evaluation - Urine ACR  01/19/2024    HEMOGLOBIN A1C  03/23/2024   Diabetic kidney evaluation - eGFR measurement  05/23/2024   Medicare Annual Wellness (AWV)  11/20/2024   MAMMOGRAM  02/02/2025   DEXA SCAN  Completed   Hepatitis C Screening  Completed   Zoster Vaccines- Shingrix  Completed   HPV VACCINES  Aged Out   Meningococcal B Vaccine  Aged Out    Health Maintenance  Health Maintenance Due  Topic Date Due   FOOT EXAM  Never done   Pneumonia Vaccine 26+ Years old (3 of 3 - PCV20 or PCV21) 11/22/2021   COVID-19 Vaccine (4 - 2024-25 season) 02/13/2023   Health Maintenance Items Addressed:   Additional Screening:  Vision Screening: Recommended annual ophthalmology exams for early detection of glaucoma and other disorders of the eye. Would you like a referral to an eye doctor? No    Dental Screening: Recommended annual dental exams for proper oral hygiene  Community Resource Referral / Chronic Care Management: CRR required this visit?  No   CCM required this visit?  No   Plan:    I have personally reviewed and noted the following in the patient's chart:   Medical and social history Use of alcohol, tobacco or illicit drugs  Current medications and supplements including opioid prescriptions. Patient is not currently taking opioid prescriptions. Functional ability and status Nutritional status Physical activity Advanced directives List of other physicians Hospitalizations, surgeries, and ER visits in previous 12 months Vitals Screenings to include cognitive, depression, and falls Referrals and appointments  In addition, I have reviewed and discussed with patient certain preventive protocols, quality metrics, and best practice recommendations. A written personalized care plan for preventive services as well as general preventive health recommendations were provided to patient.   Dewayne Ford, LPN   06/19/1094   After Visit Summary: (MyChart) Due to this being a telephonic visit, the after visit  summary with patients personalized plan was offered to patient via MyChart   Notes: Nothing significant to report at this time.

## 2023-11-21 NOTE — Patient Instructions (Addendum)
 Rachel Blair , Thank you for taking time out of your busy schedule to complete your Annual Wellness Visit with me. I enjoyed our conversation and look forward to speaking with you again next year. I, as well as your care team,  appreciate your ongoing commitment to your health goals. Please review the following plan we discussed and let me know if I can assist you in the future. Your Game plan/ To Do List    Referrals: If you haven't heard from the office you've been referred to, please reach out to them at the phone provided.   Follow up Visits: Next Medicare AWV with our clinical staff: 11/26/24 @ 10:40a   Have you seen your provider in the last 6 months (3 months if uncontrolled diabetes)?  Next Office Visit with your provider: 02/22/24 @ 10:15@  Clinician Recommendations:  Aim for 30 minutes of exercise or brisk walking, 6-8 glasses of water, and 5 servings of fruits and vegetables each day.       This is a list of the screening recommended for you and due dates:  Health Maintenance  Topic Date Due   Complete foot exam   Never done   Pneumonia Vaccine (3 of 3 - PCV20 or PCV21) 11/22/2021   COVID-19 Vaccine (4 - 2024-25 season) 02/13/2023   Colon Cancer Screening  11/22/2023*   Eye exam for diabetics  01/12/2024*   DTaP/Tdap/Td vaccine (2 - Tdap) 06/23/2024*   Flu Shot  01/13/2024   Yearly kidney health urinalysis for diabetes  01/19/2024   Hemoglobin A1C  03/23/2024   Yearly kidney function blood test for diabetes  05/23/2024   Medicare Annual Wellness Visit  11/20/2024   Mammogram  02/02/2025   DEXA scan (bone density measurement)  Completed   Hepatitis C Screening  Completed   Zoster (Shingles) Vaccine  Completed   HPV Vaccine  Aged Out   Meningitis B Vaccine  Aged Out  *Topic was postponed. The date shown is not the original due date.    Advanced directives: (Declined) Advance directive discussed with you today. Even though you declined this today, please call our office  should you change your mind, and we can give you the proper paperwork for you to fill out. Advance Care Planning is important because it:  [x]  Makes sure you receive the medical care that is consistent with your values, goals, and preferences  [x]  It provides guidance to your family and loved ones and reduces their decisional burden about whether or not they are making the right decisions based on your wishes.  Follow the link provided in your after visit summary or read over the paperwork we have mailed to you to help you started getting your Advance Directives in place. If you need assistance in completing these, please reach out to us  so that we can help you!  See attachments for Preventive Care and Fall Prevention Tips.

## 2023-11-28 ENCOUNTER — Encounter: Payer: Self-pay | Admitting: Internal Medicine

## 2023-11-30 ENCOUNTER — Other Ambulatory Visit: Payer: Self-pay | Admitting: Internal Medicine

## 2024-01-19 ENCOUNTER — Other Ambulatory Visit: Payer: Self-pay | Admitting: Internal Medicine

## 2024-01-19 DIAGNOSIS — Z1231 Encounter for screening mammogram for malignant neoplasm of breast: Secondary | ICD-10-CM

## 2024-01-21 ENCOUNTER — Other Ambulatory Visit: Payer: Self-pay | Admitting: Internal Medicine

## 2024-01-23 ENCOUNTER — Other Ambulatory Visit (INDEPENDENT_AMBULATORY_CARE_PROVIDER_SITE_OTHER)

## 2024-01-23 DIAGNOSIS — E669 Obesity, unspecified: Secondary | ICD-10-CM

## 2024-01-23 DIAGNOSIS — Z79899 Other long term (current) drug therapy: Secondary | ICD-10-CM

## 2024-01-23 DIAGNOSIS — E785 Hyperlipidemia, unspecified: Secondary | ICD-10-CM

## 2024-01-23 DIAGNOSIS — G4739 Other sleep apnea: Secondary | ICD-10-CM | POA: Diagnosis not present

## 2024-01-23 DIAGNOSIS — E1165 Type 2 diabetes mellitus with hyperglycemia: Secondary | ICD-10-CM

## 2024-01-23 DIAGNOSIS — I10 Essential (primary) hypertension: Secondary | ICD-10-CM

## 2024-01-23 DIAGNOSIS — G4709 Other insomnia: Secondary | ICD-10-CM

## 2024-01-23 DIAGNOSIS — R200 Anesthesia of skin: Secondary | ICD-10-CM

## 2024-01-23 LAB — LIPID PANEL
Cholesterol: 123 mg/dL (ref 0–200)
HDL: 46.8 mg/dL (ref >39.00)
LDL Cholesterol: 35 mg/dL (ref 0–99)
NonHDL: 75.71
Total CHOL/HDL Ratio: 3
Triglycerides: 203 mg/dL — ABNORMAL HIGH (ref 0.0–149.0)
VLDL: 40.6 mg/dL — ABNORMAL HIGH (ref 0.0–40.0)

## 2024-01-23 LAB — BASIC METABOLIC PANEL WITH GFR
BUN: 16 mg/dL (ref 6–23)
CO2: 28 meq/L (ref 19–32)
Calcium: 9.1 mg/dL (ref 8.4–10.5)
Chloride: 103 meq/L (ref 96–112)
Creatinine, Ser: 0.78 mg/dL (ref 0.40–1.20)
GFR: 75.75 mL/min (ref >60.00)
Glucose, Bld: 82 mg/dL (ref 70–99)
Potassium: 3.2 meq/L — ABNORMAL LOW (ref 3.5–5.1)
Sodium: 140 meq/L (ref 135–145)

## 2024-01-23 LAB — HEMOGLOBIN A1C: Hgb A1c MFr Bld: 5.7 % (ref 4.6–6.5)

## 2024-01-23 LAB — CBC WITH DIFFERENTIAL/PLATELET
Basophils Absolute: 0.1 K/uL (ref 0.0–0.1)
Basophils Relative: 0.8 % (ref 0.0–3.0)
Eosinophils Absolute: 0.3 K/uL (ref 0.0–0.7)
Eosinophils Relative: 4.1 % (ref 0.0–5.0)
HCT: 44.9 % (ref 36.0–46.0)
Hemoglobin: 15.3 g/dL — ABNORMAL HIGH (ref 12.0–15.0)
Lymphocytes Relative: 46 % (ref 12.0–46.0)
Lymphs Abs: 3 K/uL (ref 0.7–4.0)
MCHC: 34.1 g/dL (ref 30.0–36.0)
MCV: 87.2 fl (ref 78.0–100.0)
Monocytes Absolute: 0.7 K/uL (ref 0.1–1.0)
Monocytes Relative: 10.6 % (ref 3.0–12.0)
Neutro Abs: 2.6 K/uL (ref 1.4–7.7)
Neutrophils Relative %: 38.5 % — ABNORMAL LOW (ref 43.0–77.0)
Platelets: 224 K/uL (ref 150.0–400.0)
RBC: 5.15 Mil/uL — ABNORMAL HIGH (ref 3.87–5.11)
RDW: 13.8 % (ref 11.5–15.5)
WBC: 6.6 K/uL (ref 4.0–10.5)

## 2024-01-23 LAB — HEPATIC FUNCTION PANEL
ALT: 14 U/L (ref 0–35)
AST: 14 U/L (ref 0–37)
Albumin: 4.3 g/dL (ref 3.5–5.2)
Alkaline Phosphatase: 51 U/L (ref 39–117)
Bilirubin, Direct: 0.2 mg/dL (ref 0.0–0.3)
Total Bilirubin: 1 mg/dL (ref 0.2–1.2)
Total Protein: 6.4 g/dL (ref 6.0–8.3)

## 2024-01-23 LAB — MICROALBUMIN / CREATININE URINE RATIO
Creatinine,U: 213.4 mg/dL
Microalb Creat Ratio: 9.7 mg/g (ref 0.0–30.0)
Microalb, Ur: 2.1 mg/dL — ABNORMAL HIGH (ref 0.0–1.9)

## 2024-01-23 LAB — TSH: TSH: 2.84 u[IU]/mL (ref 0.35–5.50)

## 2024-01-23 LAB — VITAMIN B12: Vitamin B-12: 104 pg/mL — ABNORMAL LOW (ref 211–911)

## 2024-01-26 ENCOUNTER — Other Ambulatory Visit: Payer: Self-pay | Admitting: Internal Medicine

## 2024-01-26 ENCOUNTER — Other Ambulatory Visit: Payer: Self-pay | Admitting: Family

## 2024-02-08 ENCOUNTER — Ambulatory Visit
Admission: RE | Admit: 2024-02-08 | Discharge: 2024-02-08 | Disposition: A | Discharge status: Home / Self Care | Source: Ambulatory Visit | Attending: Internal Medicine | Admitting: Internal Medicine

## 2024-02-08 DIAGNOSIS — Z1231 Encounter for screening mammogram for malignant neoplasm of breast: Secondary | ICD-10-CM

## 2024-02-21 LAB — HM DIABETES EYE EXAM

## 2024-02-22 ENCOUNTER — Ambulatory Visit: Payer: Self-pay | Admitting: Internal Medicine

## 2024-02-22 ENCOUNTER — Encounter: Payer: Self-pay | Admitting: Internal Medicine

## 2024-02-22 ENCOUNTER — Other Ambulatory Visit: Payer: Self-pay | Admitting: Internal Medicine

## 2024-02-22 ENCOUNTER — Encounter: Admitting: Family Medicine

## 2024-02-22 ENCOUNTER — Ambulatory Visit: Admitting: Internal Medicine

## 2024-02-22 VITALS — BP 96/60 | HR 72 | Temp 98.1°F | Ht 62.9 in | Wt 190.6 lb

## 2024-02-22 DIAGNOSIS — I10 Essential (primary) hypertension: Secondary | ICD-10-CM | POA: Diagnosis not present

## 2024-02-22 DIAGNOSIS — Z23 Encounter for immunization: Secondary | ICD-10-CM

## 2024-02-22 DIAGNOSIS — G4709 Other insomnia: Secondary | ICD-10-CM

## 2024-02-22 DIAGNOSIS — Z79899 Other long term (current) drug therapy: Secondary | ICD-10-CM | POA: Diagnosis not present

## 2024-02-22 DIAGNOSIS — E876 Hypokalemia: Secondary | ICD-10-CM

## 2024-02-22 DIAGNOSIS — G4739 Other sleep apnea: Secondary | ICD-10-CM | POA: Diagnosis not present

## 2024-02-22 DIAGNOSIS — E118 Type 2 diabetes mellitus with unspecified complications: Secondary | ICD-10-CM | POA: Diagnosis not present

## 2024-02-22 DIAGNOSIS — Z0000 Encounter for general adult medical examination without abnormal findings: Secondary | ICD-10-CM | POA: Diagnosis not present

## 2024-02-22 DIAGNOSIS — E785 Hyperlipidemia, unspecified: Secondary | ICD-10-CM

## 2024-02-22 DIAGNOSIS — R7989 Other specified abnormal findings of blood chemistry: Secondary | ICD-10-CM

## 2024-02-22 MED ORDER — LORAZEPAM 1 MG PO TABS: 1.0000 mg | ORAL_TABLET | Freq: Three times a day (TID) | ORAL | 0 refills | Status: AC | PRN

## 2024-02-22 MED ORDER — TIRZEPATIDE 7.5 MG/0.5ML ~~LOC~~ SOAJ: 7.5000 mg | SUBCUTANEOUS | 3 refills | Status: AC

## 2024-02-22 MED ORDER — POTASSIUM CHLORIDE CRYS ER 20 MEQ PO TBCR: 20.0000 meq | EXTENDED_RELEASE_TABLET | Freq: Every day | ORAL | 1 refills | Status: DC

## 2024-02-22 NOTE — Progress Notes (Signed)
 I forgot to review your b12 level today at  your visit ... it is low  Please add 1000 mcg or more of vit b12  daily

## 2024-02-22 NOTE — Patient Instructions (Addendum)
 Good to see you today . Inc mounjaro  to 7.5 trial . Labs ok except for low potassium level .  Plan to add potassium supplement  and then recheck bmp in 2-3 weeks after change .to check potassium level and decision to remain on supplement  Plan rov  after 3 mos on new dosing mounjaro  . Or as needed.

## 2024-02-22 NOTE — Progress Notes (Signed)
 Chief Complaint  Patient presents with   Annual Exam    HPI: Patient  Rachel Blair  73 y.o. comes in today for Preventive Health Care visit  And Chronic disease management   IF:Nazdpub mounjaro  5 constipation    on but doing ok  as been on this dose for a while  Sleep /insomnia bi pap  ambien   every night .doesn't think could sleep without it HT:  hydrodiuril    Lorazepam  prn  not  very often but will like to have  on hand .  Son is mentally ill  on meds  and divorced with 96 yo daughter and difficult    on many meds .  So family stres but she is doing  ok.   Health Maintenance  Topic Date Due   COVID-19 Vaccine (4 - 2025-26 season) 03/09/2024 (Originally 02/13/2024)   Colonoscopy  04/23/2024 (Originally 02/23/2021)   DTaP/Tdap/Td (2 - Tdap) 06/23/2024 (Originally 06/09/2020)   Pneumococcal Vaccine: 50+ Years (3 of 3 - PCV20 or PCV21) 02/21/2025 (Originally 11/22/2021)   HEMOGLOBIN A1C  07/25/2024   Medicare Annual Wellness (AWV)  11/20/2024   Diabetic kidney evaluation - eGFR measurement  01/22/2025   Diabetic kidney evaluation - Urine ACR  01/22/2025   OPHTHALMOLOGY EXAM  02/20/2025   FOOT EXAM  02/21/2025   MAMMOGRAM  02/07/2026   Influenza Vaccine  Completed   DEXA SCAN  Completed   Hepatitis C Screening  Completed   Zoster Vaccines- Shingrix  Completed   HPV VACCINES  Aged Out   Meningococcal B Vaccine  Aged Out   Health Maintenance Review LIFESTYLE:   Tobacco/ETS:n Alcohol:  n Sugar beverages: n Sleep: bipap  Drug use: no HH of married  helps some with 73 yo GD       ROS:  GEN/ HEENT: No fever, significant weight changes sweats headaches vision problems hearing changes, CV/ PULM; No chest pain shortness of breath cough, syncope,edema  change in exercise tolerance. GI /GU: No adominal pain, vomiting, change in bowel habits. No blood in the stool. No significant GU symptoms. SKIN/HEME: ,no acute skin rashes suspicious lesions or bleeding. No lymphadenopathy,  nodules, masses.  NEURO/ PSYCH:  No neurologic signs such as weakness numbness. No depression anxiety. IMM/ Allergy: No unusual infections.  Allergy .   REST of 12 system review negative except as per HPI   Past Medical History:  Diagnosis Date   Allergy    Arthritis    CHEST DISCOMFORT 08/30/2007   Qualifier: Diagnosis of  By: Charlett MD, Apolinar POUR    Chest pain    atypical   Diabetes mellitus without complication (HCC)    History of pneumonia    Hypertension    Nasal septal deviation    OBSTRUCTIVE SLEEP APNEA 08/30/2007   Qualifier: Diagnosis of  By: Charlett MD, Apolinar POUR    Odontogenic cyst    of left maxillary sinus   OSA (obstructive sleep apnea)    moderatley severe, uses CPAP nightly   Trigger thumb of left hand     Past Surgical History:  Procedure Laterality Date   biopsy and bone graft of left maxillary sinus odontogenic cyst     CHOLECYSTECTOMY     left thumb trigger finger release Right    TONSILLECTOMY     TRIGGER FINGER RELEASE Left 05/27/2015   Procedure: RELEASE TRIGGER FINGER/A-1 PULLEY LEFT THUMB;  Surgeon: Arley Curia, MD;  Location: [facility];  Service: Orthopedics;  Laterality: Left;  TUBAL LIGATION      Family History  Problem Relation Age of Onset   Heart disease Mother    Stroke Father    Heart failure Father    Emphysema Father    Rheum arthritis Daughter    Breast cancer Paternal Aunt    Snoring Brother    Lung cancer Brother     Social History   Socioeconomic History   Marital status: Married    Spouse name: Not on file   Number of children: 3   Years of education: Not on file   Highest education level: 12th grade  Occupational History   Occupation: retired  Tobacco Use   Smoking status: Former    Current packs/day: 0.00    Average packs/day: 1 pack/day for 30.0 years (30.0 ttl pk-yrs)    Types: Cigarettes    Start date: 06/14/1968    Quit date: 06/14/1998    Years since quitting: 25.7   Smokeless tobacco: Never   Vaping Use   Vaping status: Never Used  Substance and Sexual Activity   Alcohol use: Not Currently    Comment: once a month   Drug use: No   Sexual activity: Yes    Birth control/protection: None  Other Topics Concern   Not on file  Social History Narrative   Married   Regular exercise   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     Ex smoker  Stopped 2000   G3P3   2 cats and a dog          Social Drivers of Corporate investment banker Strain: Low Risk  (11/21/2023)   Overall Financial Resource Strain (CARDIA)    Difficulty of Paying Living Expenses: Not hard at all  Food Insecurity: No Food Insecurity (11/21/2023)   Hunger Vital Sign    Worried About Running Out of Food in the Last Year: Never true    Ran Out of Food in the Last Year: Never true  Transportation Needs: No Transportation Needs (11/21/2023)   PRAPARE - Administrator, Civil Service (Medical): No    Lack of Transportation (Non-Medical): No  Physical Activity: Inactive (11/21/2023)   Exercise Vital Sign    Days of Exercise per Week: 0 days    Minutes of Exercise per Session: 0 min  Stress: No Stress Concern Present (11/21/2023)   [employer] of Occupational Health - Occupational Stress Questionnaire    Feeling of Stress : Not at all  Social Connections: Socially Integrated (11/21/2023)   Social Connection and Isolation Panel    Frequency of Communication with Friends and Family: More than three times a week    Frequency of Social Gatherings with Friends and Family: More than three times a week    Attends Religious Services: More than 4 times per year    Active Member of [employer] or Organizations: Yes    Attends Engineer, structural: More than 4 times per year    Marital Status: Married    Outpatient Medications Prior to Visit  Medication Sig Dispense Refill   ACCU-CHEK GUIDE test strip USE AS  DIRECTED TO TEST BLOOD SUGAR 1 TO 2 TIMES DAILY 50 strip 1   Accu-Chek Softclix Lancets lancets USE 2 LANCETS DAILY AS DIRECTED 100 each 0   aspirin  EC 81 MG tablet Take 1 tablet (81 mg total)  by mouth daily. Swallow whole.     Blood Glucose Monitoring Suppl (ACCU-CHEK GUIDE) w/Device KIT USE TO TEST BLOOD SUGAR 1 TO 2 TIMES DAILY AS DIRECTED 1 kit 0   FARXIGA  5 MG TABS tablet TAKE 1 TABLET(5 MG) BY MOUTH DAILY BEFORE BREAKFAST 90 tablet 1   hydrochlorothiazide  (HYDRODIURIL ) 25 MG tablet Take 1 tablet (25 mg total) by mouth daily. 90 tablet 3   rosuvastatin  (CRESTOR ) 5 MG tablet TAKE 1 TABLET(5 MG) BY MOUTH DAILY 90 tablet 1   zolpidem  (AMBIEN ) 10 MG tablet TAKE 1 TABLET(10 MG) BY MOUTH AT BEDTIME AS NEEDED FOR SLEEP 90 tablet 0   MOUNJARO  5 MG/0.5ML Pen ADMINISTER 5 MG UNDER THE SKIN 1 TIME A WEEK 6 mL 1   cycloSPORINE (RESTASIS) 0.05 % ophthalmic emulsion Place 1 drop into both eyes 2 (two) times daily. (Patient not taking: Reported on 02/22/2024)     fluticasone  (FLONASE ) 50 MCG/ACT nasal spray Place 2 sprays into both nostrils daily. (Patient not taking: Reported on 02/22/2024) 16 g 0   LORazepam  (ATIVAN ) 1 MG tablet Take 1 tablet (1 mg total) by mouth every 8 (eight) hours as needed for anxiety. (Patient not taking: Reported on 02/22/2024) 30 tablet 0   No facility-administered medications prior to visit.     EXAM:  BP 96/60 (BP Location: Left Arm, Patient Position: Sitting, Cuff Size: Large)   Pulse 72   Temp 98.1 F (36.7 C) (Oral)   Ht 5' 2.9 (1.598 m)   Wt 190 lb 9.6 oz (86.5 kg)   SpO2 97%   BMI 33.87 kg/m   Body mass index is 33.87 kg/m. Wt Readings from Last 3 Encounters:  02/22/24 190 lb 9.6 oz (86.5 kg)  11/21/23 191 lb (86.6 kg)  11/09/23 180 lb (81.6 kg)   BP Readings from Last 3 Encounters:  02/22/24 96/60  10/14/23 135/61  09/22/23 100/60     Physical Exam: Vital signs reviewed HZW:Uypd is a well-developed well-nourished alert cooperative    who appearsr  stated age in no acute distress.  HEENT: normocephalic atraumatic , Eyes: PERRL EOM's full, conjunctiva clear, Nares: paten,t no deformity discharge or tenderness., Ears: no deformity EAC's clear TMs with normal landmarks. Mouth: clear OP, no lesions, edema.  Moist mucous membranes. Dentition in adequate repair. NECK: supple without masses, thyromegaly or bruits. CHEST/PULM:  Clear to auscultation and percussion breath sounds equal no wheeze , rales or rhonchi. No chest wall deformities or tenderness. Breast: normal by inspection . No dimpling, discharge, masses, tenderness or discharge . CV: PMI is nondisplaced, S1 S2 no gallops, murmurs, rubs. Peripheral pulses are full without delay.No JVD .  ABDOMEN: Bowel sounds normal nontender  No guard or rebound, no hepato splenomegal no CVA tenderness.  Extremtities:  No clubbing cyanosis or edema, no acute joint swelling or redness no focal atrophy NEURO:  Oriented x3, cranial nerves 3-12 appear to be intact, no obvious focal weakness,gait within normal limits no abnormal reflexes or asymmetrical SKIN: No acute rashes normal turgor, color, no bruising or petechiae. PSYCH: Oriented, good eye contact, no obvious depression anxiety, cognition and judgment appear normal. LN: no cervical axillary  adenopathy  Diabetic foot exam was performed with the following findings:   No deformities, ulcerations, or other skin breakdown Normal sensation of 10g monofilament Intact posterior tibialis and dorsalis pedis pulses    Lab Results  Component Value Date   WBC 6.6 01/23/2024   HGB 15.3 (H) 01/23/2024   HCT 44.9 01/23/2024   PLT  224.0 01/23/2024   GLUCOSE 82 01/23/2024   CHOL 123 01/23/2024   TRIG 203.0 (H) 01/23/2024   HDL 46.80 01/23/2024   LDLDIRECT 56.0 01/19/2023   LDLCALC 35 01/23/2024   ALT 14 01/23/2024   AST 14 01/23/2024   NA 140 01/23/2024   K 3.2 (L) 01/23/2024   CL 103 01/23/2024   CREATININE 0.78 01/23/2024   BUN 16 01/23/2024   CO2  28 01/23/2024   TSH 2.84 01/23/2024   HGBA1C 5.7 01/23/2024   MICROALBUR 2.1 (H) 01/23/2024    BP Readings from Last 3 Encounters:  02/22/24 96/60  10/14/23 135/61  09/22/23 100/60    Lab results reviewed with patient   ASSESSMENT AND PLAN:  Discussed the following assessment and plan:    ICD-10-CM   1. Visit for preventive health examination  Z00.00     2. Controlled type 2 diabetes mellitus with complication, without long-term current use of insulin (HCC)  E11.8     3. Medication management  Z79.899     4. 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 Essential hypertension  I10     6. Other insomnia  G47.09     7. Hyperlipidemia, unspecified hyperlipidemia type  E78.5     8. Low blood potassium  E87.6     9. Flu vaccine need  Z23 Flu vaccine HIGH DOSE PF(Fluzone Trivalent)    10. Low vitamin B12 level  R79.89     Low potassium  prob from diuretic although had not been sig in past perhaps combo meds  no sx of such but will place on supplement and fu recheck  bm p ordere placed ,and go from there  Will inc dose of mounjaro   as planned and 3 mos  fu  Maintain same meds otherwise doing well  She is aware of caution with benzos and benefit more than risk   Sent message to take b12 1000 mcg per day after she left since level was low  Return for lab in 2-3 weeks  and rov in 3 mos .  Patient Care Team: Aneth Schlagel, Apolinar POUR, MD as PCP - General (Internal Medicine) Thukkani, Arun K, MD as PCP - Cardiology (Cardiology) Jude Harden GAILS, MD as Consulting Physician (Pulmonary Disease) Lavonia Lye, MD as Consulting Physician (Ophthalmology) Patient Instructions  Good to see you today . Inc mounjaro  to 7.5 trial . Labs ok except for low potassium level .  Plan to add potassium supplement  and then recheck bmp in 2-3 weeks after change .to check potassium level and decision to remain on supplement  Plan rov  after 3 mos on new dosing mounjaro  . Or as needed.   Marky Buresh K. Fard Borunda  M.D.

## 2024-03-01 ENCOUNTER — Other Ambulatory Visit: Payer: Self-pay | Admitting: Internal Medicine

## 2024-03-07 ENCOUNTER — Other Ambulatory Visit (INDEPENDENT_AMBULATORY_CARE_PROVIDER_SITE_OTHER)

## 2024-03-07 DIAGNOSIS — I10 Essential (primary) hypertension: Secondary | ICD-10-CM | POA: Diagnosis not present

## 2024-03-07 DIAGNOSIS — E876 Hypokalemia: Secondary | ICD-10-CM

## 2024-03-07 DIAGNOSIS — Z79899 Other long term (current) drug therapy: Secondary | ICD-10-CM | POA: Diagnosis not present

## 2024-03-07 LAB — BASIC METABOLIC PANEL WITH GFR
BUN: 16 mg/dL (ref 6–23)
CO2: 29 meq/L (ref 19–32)
Calcium: 9.8 mg/dL (ref 8.4–10.5)
Chloride: 103 meq/L (ref 96–112)
Creatinine, Ser: 1.06 mg/dL (ref 0.40–1.20)
GFR: 52.38 mL/min — ABNORMAL LOW (ref >60.00)
Glucose, Bld: 99 mg/dL (ref 70–99)
Potassium: 3.3 meq/L — ABNORMAL LOW (ref 3.5–5.1)
Sodium: 142 meq/L (ref 135–145)

## 2024-03-07 LAB — MAGNESIUM: Magnesium: 2.1 mg/dL (ref 1.5–2.5)

## 2024-03-08 ENCOUNTER — Ambulatory Visit: Payer: Self-pay | Admitting: Internal Medicine

## 2024-03-08 DIAGNOSIS — E876 Hypokalemia: Secondary | ICD-10-CM

## 2024-03-08 NOTE — Progress Notes (Signed)
 So not sure why potassium still low on supplement  assuming taking potassium daily  . Increase potassium to 20 meq twice a day  and  check BMP in 2 weeks  on this dose and go from there

## 2024-03-21 ENCOUNTER — Other Ambulatory Visit: Payer: Self-pay | Admitting: Internal Medicine

## 2024-04-04 ENCOUNTER — Telehealth: Payer: Self-pay

## 2024-04-04 DIAGNOSIS — R7989 Other specified abnormal findings of blood chemistry: Secondary | ICD-10-CM

## 2024-04-04 DIAGNOSIS — E876 Hypokalemia: Secondary | ICD-10-CM

## 2024-04-04 NOTE — Telephone Encounter (Signed)
 Copied from CRM [id number]. Topic: Clinical - Lab/Test Results >> Apr 02, 2024 11:10 AM Carlyon D wrote: Reason for CRM: Pt calling in regards to scheduling a lab visit I do not see lab orders entered. B12 and potassium please put orders in and call pt to schedule lab visit.

## 2024-04-04 NOTE — Telephone Encounter (Signed)
 Yes please add  vit b12 level  it was low last time    vit b12 deficency is dx

## 2024-04-05 ENCOUNTER — Telehealth: Payer: Self-pay | Admitting: Sports Medicine

## 2024-04-05 NOTE — Telephone Encounter (Signed)
 Order placed. Contacted pt and lab appt is set for tomorrow for 1120am. Pt states she is coming in at 900am as husband is seeing [place]. Inform pt she can check in early, unsure if lab will able to take her in early  but she can try. Pt verbalized understanding and states if not able to, she can reschedule the appt. No further action is needed at this time.

## 2024-04-05 NOTE — Addendum Note (Signed)
 Addended by: Jacere Pangborn on: 04/05/2024 09:39 AM   Modules accepted: Orders

## 2024-04-05 NOTE — Telephone Encounter (Signed)
 Please run for L knee Durolane, last 08/2022.  Pt is ready to schedule whenever auth/order is done.

## 2024-04-05 NOTE — Telephone Encounter (Signed)
 Ran benefits for left knee durolane case ID [id number]

## 2024-04-06 ENCOUNTER — Other Ambulatory Visit

## 2024-04-06 DIAGNOSIS — E876 Hypokalemia: Secondary | ICD-10-CM | POA: Diagnosis not present

## 2024-04-06 DIAGNOSIS — R7989 Other specified abnormal findings of blood chemistry: Secondary | ICD-10-CM | POA: Diagnosis not present

## 2024-04-06 LAB — BASIC METABOLIC PANEL WITH GFR
BUN: 19 mg/dL (ref 6–23)
CO2: 27 meq/L (ref 19–32)
Calcium: 9.4 mg/dL (ref 8.4–10.5)
Chloride: 103 meq/L (ref 96–112)
Creatinine, Ser: 0.88 mg/dL (ref 0.40–1.20)
GFR: 65.45 mL/min (ref >60.00)
Glucose, Bld: 95 mg/dL (ref 70–99)
Potassium: 3.4 meq/L — ABNORMAL LOW (ref 3.5–5.1)
Sodium: 142 meq/L (ref 135–145)

## 2024-04-06 LAB — VITAMIN B12: Vitamin B-12: 243 pg/mL (ref 211–911)

## 2024-04-06 NOTE — Telephone Encounter (Signed)
 Can you please schedule patient when medication is in stock  Durolane authorized for left knee NO PRE CERT REQUIRED No copay Covered at 100% Deductible $200 has met $200 OOP does not apply to these services  Reference # [id number]

## 2024-04-09 ENCOUNTER — Encounter: Payer: Self-pay | Admitting: Internal Medicine

## 2024-04-09 NOTE — Telephone Encounter (Signed)
 This is the message  from the lab test    So not sure why potassium still low on supplement  assuming taking potassium daily  . Increase potassium to 20 meq twice a day  and  check BMP in 2 weeks  on this dose and go from there

## 2024-04-10 MED ORDER — FARXIGA 5 MG PO TABS: ORAL_TABLET | ORAL | 1 refills | Status: AC

## 2024-04-10 NOTE — Telephone Encounter (Signed)
 Patient called to check in on getting her injection. Informed patient that we would call to schedule when medication was in stock

## 2024-04-10 NOTE — Telephone Encounter (Signed)
 Scheduled

## 2024-04-10 NOTE — Telephone Encounter (Signed)
 Follow up with pt  on provider's message regards to potassium.   Pt verbalized understanding. Pt states she will need a refill on the potassium Rx.   Pt continues she will be going out of town to [place]  from Nov. 9 until after thanksgiving. Would like a refill for 90 days supply on potassium, hydrochlorothiazide , farxiga , Zolpidem .   HTCZ was originally prescribed by pt's cardiology - Thukkani. Pt states she only see him one time and would like to change cardiology.   Farxiga  and BMP lab order is placed.   Please advise on Potassium Rx for 90 days supply, hydrochlorothiazide , and Zolpidem .

## 2024-04-11 ENCOUNTER — Other Ambulatory Visit: Payer: Self-pay | Admitting: Internal Medicine

## 2024-04-11 MED ORDER — POTASSIUM CHLORIDE CRYS ER 20 MEQ PO TBCR: 20.0000 meq | EXTENDED_RELEASE_TABLET | Freq: Every day | ORAL | 1 refills | Status: DC

## 2024-04-11 MED ORDER — ZOLPIDEM TARTRATE 10 MG PO TABS: ORAL_TABLET | ORAL | 0 refills | Status: AC

## 2024-04-12 NOTE — Telephone Encounter (Signed)
Meds were sent in yesterday ---

## 2024-04-17 NOTE — Progress Notes (Unsigned)
   LILLETTE Ileana Collet, PhD, LAT, ATC acting as a scribe for Artist Lloyd, MD.  Rachel Blair is a 73 y.o. female who presents to [facility] today for exacerbation of her L knee pain. Pt was last seen by Dr. Leonce on 08/31/22 and she was given a repeat Durolane injection in her L knee.  Today, pt reports great relief from prior Durolane injection in her L knee. Pain returned about 3-wks ago. She is leaving for a trip to [place]  on Monday.  Dx testing: 11/18/19 L knee MRI  Pertinent review of systems: No fevers or chills  Relevant historical information: Hypertension and diabetes   Exam:  BP 128/82   Pulse 75   Ht 5' 2 (1.575 m)   Wt 186 lb (84.4 kg)   SpO2 98%   BMI 34.02 kg/m  General: Well Developed, well nourished, and in no acute distress.   MSK: Left knee minimal effusion normal motion with crepitation.    Lab and Radiology Results  Rachel Blair presents to clinic today for Durolane injection left knee 1/1 Procedure: Real-time Ultrasound Guided Injection of left knee joint superior lateral patellar space Device: Philips Affiniti 50G/GE Logiq Images permanently stored and available for review in PACS Verbal informed consent obtained.  Discussed risks and benefits of procedure. Warned about infection, bleeding, damage to structures among others. Patient expresses understanding and agreement Time-out conducted.   Noted no overlying erythema, induration, or other signs of local infection.   Skin prepped in a sterile fashion.   Local anesthesia: Topical Ethyl chloride.   With sterile technique and under real time ultrasound guidance: Durolane 60 mg injected into knee joint. Fluid seen entering the joint capsule.   Completed without difficulty   Advised to call if fevers/chills, erythema, induration, drainage, or persistent bleeding.   Images permanently stored and available for review in the ultrasound unit.  Impression: Technically  successful ultrasound guided injection. Lot number: [id number]      Assessment and Plan: 74 y.o. female with left knee pain due to exacerbation of DJD.  Plan for Durolane injection today.  We can repeat this in 6 months if needed.   PDMP not reviewed this encounter. Orders Placed This Encounter  Procedures   US  LIMITED JOINT SPACE STRUCTURES LOW LEFT(NO LINKED CHARGES)    Reason for Exam (SYMPTOM  OR DIAGNOSIS REQUIRED):   left knee pain    Preferred imaging location?:   [facility]   Meds ordered this encounter  Medications   Sodium Hyaluronate (DUROLANE) intra-articular injection 60 mg     Discussed warning signs or symptoms. Please see discharge instructions. Patient expresses understanding.   The above documentation has been reviewed and is accurate and complete Artist Lloyd, M.D.

## 2024-04-18 ENCOUNTER — Ambulatory Visit: Admitting: Family Medicine

## 2024-04-18 ENCOUNTER — Other Ambulatory Visit: Payer: Self-pay

## 2024-04-18 VITALS — BP 128/82 | HR 75 | Ht 62.0 in | Wt 186.0 lb

## 2024-04-18 DIAGNOSIS — M25562 Pain in left knee: Secondary | ICD-10-CM

## 2024-04-18 DIAGNOSIS — M1712 Unilateral primary osteoarthritis, left knee: Secondary | ICD-10-CM | POA: Diagnosis not present

## 2024-04-18 DIAGNOSIS — G8929 Other chronic pain: Secondary | ICD-10-CM

## 2024-04-18 MED ORDER — SODIUM HYALURONATE 60 MG/3ML IX PRSY
60.0000 mg | PREFILLED_SYRINGE | Freq: Once | INTRA_ARTICULAR | Status: AC
  Administered 2024-04-18: 60 mg via INTRA_ARTICULAR

## 2024-04-18 NOTE — Patient Instructions (Signed)
Thank you for coming in today.   You received an injection today. Seek immediate medical attention if the joint becomes red, extremely painful, or is oozing fluid.   Have a great trip!

## 2024-04-30 ENCOUNTER — Other Ambulatory Visit

## 2024-04-30 DIAGNOSIS — E876 Hypokalemia: Secondary | ICD-10-CM | POA: Diagnosis not present

## 2024-04-30 LAB — BASIC METABOLIC PANEL WITH GFR
BUN: 16 mg/dL (ref 6–23)
CO2: 27 meq/L (ref 19–32)
Calcium: 9.7 mg/dL (ref 8.4–10.5)
Chloride: 103 meq/L (ref 96–112)
Creatinine, Ser: 0.96 mg/dL (ref 0.40–1.20)
GFR: 58.93 mL/min — ABNORMAL LOW (ref >60.00)
Glucose, Bld: 105 mg/dL — ABNORMAL HIGH (ref 70–99)
Potassium: 3.7 meq/L (ref 3.5–5.1)
Sodium: 141 meq/L (ref 135–145)

## 2024-05-07 ENCOUNTER — Other Ambulatory Visit: Payer: Self-pay | Admitting: Internal Medicine

## 2024-05-10 ENCOUNTER — Other Ambulatory Visit: Payer: Self-pay | Admitting: Internal Medicine

## 2024-05-14 ENCOUNTER — Telehealth: Payer: Self-pay | Admitting: Internal Medicine

## 2024-05-14 NOTE — Telephone Encounter (Signed)
 Pt would like to transfer care to Dr Anner would you both be willing to this provider switch please advise

## 2024-05-14 NOTE — Telephone Encounter (Signed)
 I guess its fine -- not sure why she wants to change?  DH

## 2024-05-24 ENCOUNTER — Ambulatory Visit: Admitting: Internal Medicine

## 2024-05-29 ENCOUNTER — Ambulatory Visit: Admitting: Internal Medicine

## 2024-05-29 ENCOUNTER — Encounter: Payer: Self-pay | Admitting: Internal Medicine

## 2024-05-29 VITALS — BP 120/62 | HR 73 | Temp 98.1°F | Ht 62.0 in | Wt 184.8 lb

## 2024-05-29 DIAGNOSIS — E876 Hypokalemia: Secondary | ICD-10-CM

## 2024-05-29 DIAGNOSIS — Z79899 Other long term (current) drug therapy: Secondary | ICD-10-CM | POA: Diagnosis not present

## 2024-05-29 DIAGNOSIS — Z7984 Long term (current) use of oral hypoglycemic drugs: Secondary | ICD-10-CM | POA: Diagnosis not present

## 2024-05-29 DIAGNOSIS — E118 Type 2 diabetes mellitus with unspecified complications: Secondary | ICD-10-CM

## 2024-05-29 DIAGNOSIS — G4739 Other sleep apnea: Secondary | ICD-10-CM

## 2024-05-29 DIAGNOSIS — I10 Essential (primary) hypertension: Secondary | ICD-10-CM | POA: Diagnosis not present

## 2024-05-29 LAB — POCT GLYCOSYLATED HEMOGLOBIN (HGB A1C): Hemoglobin A1C: 5.3 % (ref 4.0–5.6)

## 2024-05-29 NOTE — Patient Instructions (Addendum)
 Same meds  continue ls attention Fu 3-4 mos or as needed

## 2024-05-29 NOTE — Progress Notes (Signed)
 Chief Complaint  Patient presents with   Medical Management of Chronic Issues    HPI: 20 T Reith 73 y.o. come in for Chronic disease managements  DM / OSA weight: Mounjaro   increased dose to 7.5 mg weekly and tolerating well Low K  supplemented  and fu lab acceptable( on hydrochlorothiazide )  B12: low normal  last check  is taking otc b12  Feels best than she has in a long time saving  hip pain with activity.  To have fu with cardiology in Jan changing teams,  ROS: See pertinent positives and negatives per HPI.  Past Medical History:  Diagnosis Date   Allergy    Arthritis    CHEST DISCOMFORT 08/30/2007   Qualifier: Diagnosis of  By: Charlett MD, Apolinar POUR    Chest pain    atypical   Diabetes mellitus without complication (HCC)    History of pneumonia    Hypertension    Nasal septal deviation    OBSTRUCTIVE SLEEP APNEA 08/30/2007   Qualifier: Diagnosis of  By: Charlett MD, Apolinar POUR    Odontogenic cyst    of left maxillary sinus   OSA (obstructive sleep apnea)    moderatley severe, uses CPAP nightly   Trigger thumb of left hand     Family History  Problem Relation Age of Onset   Heart disease Mother    Stroke Father    Heart failure Father    Emphysema Father    Rheum arthritis Daughter    Breast cancer Paternal Aunt    Snoring Brother    Lung cancer Brother     Social History   Socioeconomic History   Marital status: Married    Spouse name: Not on file   Number of children: 3   Years of education: Not on file   Highest education level: 12th grade  Occupational History   Occupation: retired  Tobacco Use   Smoking status: Former    Current packs/day: 0.00    Average packs/day: 1 pack/day for 30.0 years (30.0 ttl pk-yrs)    Types: Cigarettes    Start date: 06/14/1968    Quit date: 06/14/1998    Years since quitting: 25.9   Smokeless tobacco: Never  Vaping Use   Vaping status: Never Used  Substance and Sexual Activity   Alcohol use: Not Currently     Comment: once a month   Drug use: No   Sexual activity: Yes    Birth control/protection: None  Other Topics Concern   Not on file  Social History Narrative   Married   Regular exercise   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    Working as guardian adlitem     Ex smoker  Stopped 2000   G3P3   2 cats and a dog          Social Drivers of Health   Tobacco Use: Medium Risk (05/29/2024)   Patient History    Smoking Tobacco Use: Former    Smokeless Tobacco Use: Never    Passive Exposure: Not on Actuary Strain: Low Risk (11/21/2023)   Overall Financial Resource Strain (CARDIA)    Difficulty of Paying Living Expenses: Not hard at all  Food Insecurity: No Food Insecurity (11/21/2023)   Hunger Vital Sign    Worried About Running Out of Food in the Last Year: Never true    Ran Out  of Food in the Last Year: Never true  Transportation Needs: No Transportation Needs (11/21/2023)   PRAPARE - Administrator, Civil Service (Medical): No    Lack of Transportation (Non-Medical): No  Physical Activity: Inactive (11/21/2023)   Exercise Vital Sign    Days of Exercise per Week: 0 days    Minutes of Exercise per Session: 0 min  Stress: No Stress Concern Present (11/21/2023)   [employer] of Occupational Health - Occupational Stress Questionnaire    Feeling of Stress : Not at all  Social Connections: Socially Integrated (11/21/2023)   Social Connection and Isolation Panel    Frequency of Communication with Friends and Family: More than three times a week    Frequency of Social Gatherings with Friends and Family: More than three times a week    Attends Religious Services: More than 4 times per year    Active Member of Clubs or Organizations: Yes    Attends Banker Meetings: More than 4 times per year    Marital Status: Married  Depression (PHQ2-9): Low Risk (02/22/2024)   Depression  (PHQ2-9)    PHQ-2 Score: 2  Alcohol Screen: Low Risk (11/21/2023)   Alcohol Screen    Last Alcohol Screening Score (AUDIT): 2  Housing: Unknown (11/21/2023)   Housing Stability Vital Sign    Unable to Pay for Housing in the Last Year: No    Number of Times Moved in the Last Year: Not on file    Homeless in the Last Year: No  Utilities: Not At Risk (11/21/2023)   AHC Utilities    Threatened with loss of utilities: No  Health Literacy: Adequate Health Literacy (11/21/2023)   B1300 Health Literacy    Frequency of need for help with medical instructions: Never    Outpatient Medications Prior to Visit  Medication Sig Dispense Refill   ACCU-CHEK GUIDE test strip USE AS DIRECTED TO TEST BLOOD SUGAR 1 TO 2 TIMES DAILY 50 strip 1   Accu-Chek Softclix Lancets lancets USE 2 LANCETS DAILY AS DIRECTED 100 each 0   aspirin  EC 81 MG tablet Take 1 tablet (81 mg total) by mouth daily. Swallow whole.     Blood Glucose Monitoring Suppl (ACCU-CHEK GUIDE) w/Device KIT USE TO TEST BLOOD SUGAR 1 TO 2 TIMES DAILY AS DIRECTED 1 kit 0   FARXIGA  5 MG TABS tablet TAKE 1 TABLET(5 MG) BY MOUTH DAILY BEFORE BREAKFAST 90 tablet 1   hydrochlorothiazide  (HYDRODIURIL ) 25 MG tablet TAKE 1 TABLET(25 MG) BY MOUTH DAILY 15 tablet 0   LORazepam  (ATIVAN ) 1 MG tablet Take 1 tablet (1 mg total) by mouth every 8 (eight) hours as needed for anxiety. 30 tablet 0   potassium chloride  SA (KLOR-CON  M) 20 MEQ tablet TAKE 1 TABLET(20 MEQ) BY MOUTH DAILY OR AS DIRECTED 30 tablet 1   rosuvastatin  (CRESTOR ) 5 MG tablet TAKE 1 TABLET(5 MG) BY MOUTH DAILY 90 tablet 1   tirzepatide  (MOUNJARO ) 7.5 MG/0.5ML Pen Inject 7.5 mg into the skin once a week. Dosage change 6 mL 3   zolpidem  (AMBIEN ) 10 MG tablet TAKE 1 TABLET(10 MG) BY MOUTH AT BEDTIME AS NEEDED FOR SLEEP 90 tablet 0   fluticasone  (FLONASE ) 50 MCG/ACT nasal spray Place 2 sprays into both nostrils daily. (Patient not taking: Reported on 05/29/2024) 16 g 0   No facility-administered  medications prior to visit.     EXAM:  BP 120/62 (BP Location: Right Arm, Patient Position: Sitting, Cuff Size: Large)   Pulse  73   Temp 98.1 F (36.7 C) (Oral)   Ht 5' 2 (1.575 m)   Wt 184 lb 12.8 oz (83.8 kg)   SpO2 97%   BMI 33.80 kg/m   Body mass index is 33.8 kg/m. Wt Readings from Last 3 Encounters:  05/29/24 184 lb 12.8 oz (83.8 kg)  04/18/24 186 lb (84.4 kg)  02/22/24 190 lb 9.6 oz (86.5 kg)    GENERAL: vitals reviewed and listed above, alert, oriented, appears well hydrated and in no acute distress HEENT: atraumatic, conjunctiva  clear, no obvious abnormalities on inspection of external nose and ears \ NECK: no obvious masses on inspection palpation  LUNGS: clear to auscultation bilaterally, no wheezes, rales or rhonchi, good air movement CV: HRRR, no clubbing cyanosis or  peripheral edema nl cap refill  MS: moves all extremities without noticeable focal  abnormality PSYCH: pleasant and cooperative, no obvious depression or anxiety Lab Results  Component Value Date   WBC 6.6 01/23/2024   HGB 15.3 (H) 01/23/2024   HCT 44.9 01/23/2024   PLT 224.0 01/23/2024   GLUCOSE 105 (H) 04/30/2024   CHOL 123 01/23/2024   TRIG 203.0 (H) 01/23/2024   HDL 46.80 01/23/2024   LDLDIRECT 56.0 01/19/2023   LDLCALC 35 01/23/2024   ALT 14 01/23/2024   AST 14 01/23/2024   NA 141 04/30/2024   K 3.7 04/30/2024   CL 103 04/30/2024   CREATININE 0.96 04/30/2024   BUN 16 04/30/2024   CO2 27 04/30/2024   TSH 2.84 01/23/2024   HGBA1C 5.3 05/29/2024   MICROALBUR 2.1 (H) 01/23/2024   BP Readings from Last 3 Encounters:  05/29/24 120/62  04/18/24 128/82  02/22/24 96/60   Lab Results  Component Value Date   VITAMINB12 243 04/06/2024     ASSESSMENT AND PLAN:  Discussed the following assessment and plan:  Controlled type 2 diabetes mellitus with complication, without long-term current use of insulin (HCC) - Plan: POC HgB A1c  Essential hypertension  Medication  management  Complex sleep apnea syndrome  Low serum potassium resolved 8# loss since last check A1c is excellent  ldl at goal and bp ok today   L potassium supp with hydrochlorothiazide     last check 3.7 Getting bmi below 30 would be optimal  Continue same meds dosing for now  and fu in 3-4 mos consider intensifying tirzepatide  . -Patient advised to return or notify health care team  if  new concerns arise.  Patient Instructions  Same meds  continue ls attention Fu 3-4 mos or as needed    Deantae Shackleton K. Guiliana Shor M.D.

## 2024-06-12 ENCOUNTER — Encounter: Payer: Self-pay | Admitting: Internal Medicine

## 2024-06-13 MED ORDER — POTASSIUM CHLORIDE CRYS ER 20 MEQ PO TBCR: EXTENDED_RELEASE_TABLET | ORAL | 1 refills | Status: AC

## 2024-06-17 ENCOUNTER — Other Ambulatory Visit: Payer: Self-pay | Admitting: Internal Medicine

## 2024-06-26 NOTE — Progress Notes (Unsigned)
 " Cardiology Office Note:  .   Date:  06/27/2024  ID:  Rachel Blair, DOB 1951-04-30, MRN [id number] PCP: Charlett Apolinar POUR, MD  [facility] Providers Cardiologist:  Lurena POUR Red, MD {  History of Present Illness: .   Rachel Blair is a 74 y.o. female with history of  coronary artery calcifications noted on CAC scoring 04/2022, type 2 diabetes, hypertension, hyperlipidemia, CKD, OSA, LAFB, OSA on CPAP     CAC 04/2022 CAC score of 276.  Echocardiogram normal, no valve disease.  Other Reportedly intolerant to ARB's     Patient with history of elevated Ca score of 276 2023.  Last seen 03/2023 doing well from a cardiac perspective without any complaints.  Started on aspirin  for CAC.  Started on lisinopril  10 mg, HCTZ stopped.  Today patient presents for routine follow-up.  She is doing well without any significant complaints today.  Reports she is active and likes playing in the yard and without any exertional symptoms.  Reports that lisinopril  had been stopped because it was giving her headaches and now back on HCTZ.  Blood pressure has been well-controlled.  Offers no other complaints today.  ROS: Denies: Chest pain, shortness of breath, orthopnea, peripheral edema, palpitations, syncope, decreased exercise capacity, fatigue, dizziness.   Studies Reviewed: SABRA    EKG Interpretation Date/Time:  Wednesday June 27 2024 08:05:03 EST Ventricular Rate:  79 PR Interval:  154 QRS Duration:  78 QT Interval:  390 QTC Calculation: 447 R Axis:   -50  Text Interpretation: Normal sinus rhythm Left anterior fascicular block When compared with ECG of 05-Apr-2023 10:40, No significant change was found Confirmed by Darryle Currier [phone]) on 06/27/2024 8:09:59 AM    Risk Assessment/Calculations:           Physical Exam:   VS:  BP 114/70 (BP Location: Left Arm, Patient Position: Sitting, Cuff Size: Normal)   Pulse 79   Ht 5' 3 (1.6 m)   Wt 184 lb (83.5 kg)   BMI 32.59 kg/m    Wt  Readings from Last 3 Encounters:  06/27/24 184 lb (83.5 kg)  05/29/24 184 lb 12.8 oz (83.8 kg)  04/18/24 186 lb (84.4 kg)    GEN: Well nourished, well developed in no acute distress NECK: No JVD; No carotid bruits CARDIAC: RRR, no murmurs, rubs, gallops RESPIRATORY:  Clear to auscultation without rales, wheezing or rhonchi  ABDOMEN: Soft, non-tender, non-distended EXTREMITIES:  No edema; No deformity   ASSESSMENT AND PLAN: .    CAC Hyperlipidemia -04/2022 CAC score of 276.  Echocardiogram normal. Stable disease, EKG with no acute ST-T wave changes today.  She demonstrates no chest pain or exertional symptoms or equivalents that would prompt further evaluation at this time. Continue with aspirin , low-dose rosuvastatin  5 mg.  Reports mild myalgias on this dose but tolerating it okay. LDL is 35 01/2024.  Excellent control.  Hypertension Well-controlled blood pressure.  114/70.  Has not tolerated lisinopril /losartan.  Blood pressure well-controlled on HCTZ 25 mg daily and also on potassium supplementation.  Type 2 diabetes Well-controlled diabetes.  A1c 5.3% 05/2024.  Continue Farxiga  5 mg for diabetes and CKD.  OSA on CPAP Compliant.    Dispo: 1 year follow-up.  She would like to transition from Dr. Red to Dr. Anner because the rest of her family is under the care of Dr. Anner.  I will message both of these MDs to ensure this is okay.  Signed, Currier LITTIE Darryle, PA-C  "

## 2024-06-27 ENCOUNTER — Encounter: Payer: Self-pay | Admitting: Cardiology

## 2024-06-27 ENCOUNTER — Ambulatory Visit: Admitting: Cardiology

## 2024-06-27 VITALS — BP 114/70 | HR 79 | Ht 63.0 in | Wt 184.0 lb

## 2024-06-27 DIAGNOSIS — E118 Type 2 diabetes mellitus with unspecified complications: Secondary | ICD-10-CM

## 2024-06-27 DIAGNOSIS — E785 Hyperlipidemia, unspecified: Secondary | ICD-10-CM

## 2024-06-27 DIAGNOSIS — E1169 Type 2 diabetes mellitus with other specified complication: Secondary | ICD-10-CM

## 2024-06-27 DIAGNOSIS — I2510 Atherosclerotic heart disease of native coronary artery without angina pectoris: Secondary | ICD-10-CM

## 2024-06-27 DIAGNOSIS — N182 Chronic kidney disease, stage 2 (mild): Secondary | ICD-10-CM

## 2024-06-27 MED ORDER — HYDROCHLOROTHIAZIDE 25 MG PO TABS: 25.0000 mg | ORAL_TABLET | Freq: Every day | ORAL | 3 refills | Status: AC

## 2024-06-27 NOTE — Patient Instructions (Signed)
 Medication Instructions:  NO CHANGES  Lab Work: NONE TO BE DONE TODAY.  Testing/Procedures: NONE  Follow-Up: At [facility], you and your health needs are our priority.  As part of our continuing mission to provide you with exceptional heart care, our providers are all part of one team.  This team includes your primary Cardiologist (physician) and Advanced Practice Providers or APPs (Physician Assistants and Nurse Practitioners) who all work together to provide you with the care you need, when you need it.  Your next appointment:   1 YEAR  Provider:   DR. HARDING    Other Instructions:

## 2024-07-17 ENCOUNTER — Telehealth (HOSPITAL_BASED_OUTPATIENT_CLINIC_OR_DEPARTMENT_OTHER): Payer: Self-pay | Admitting: *Deleted

## 2024-07-17 NOTE — Telephone Encounter (Signed)
"  ° °  Pre-operative Risk Assessment    Patient Name: Rachel Blair  DOB: 16-Oct-1950 MRN: [id number]   Date of last office visit: 06/27/24 THOM SLUDER, PAC Date of next office visit: NONE   Request for Surgical Clearance    Procedure:  B/L UPPER EYELID BLEPHAROPLASTY CC TRICHOPHYTIC FOREHEAD RHYTIDECTOMY   Date of Surgery:  Clearance 08/07/24                                Surgeon:  DR. OSBORNE PONDER Surgeon's Group or Practice Name:  [facility] Phone number:  [phone] Fax number:  [phone]   Type of Clearance Requested:   - Medical  - Pharmacy:  Hold Aspirin      Type of Anesthesia:  Not Indicated   Additional requests/questions:    Bonney Niels Jest   07/17/2024, 8:34 AM   "

## 2024-07-17 NOTE — Telephone Encounter (Signed)
 Rachel Blair,  You saw this patient on 06/27/24. Per office protocol, will you please comment on medical clearance for B/L UPPER EYELID BLEPHAROPLASTY CC TRICHOPHYTIC FOREHEAD RHYTIDECTOMY?  Please route your response to P CV DIV Preop. I will communicate with requesting office once you have given recommendations.   Thank you!  [name], NP

## 2024-07-18 NOTE — Telephone Encounter (Signed)
" ° °  Patient Name: Rachel Blair  DOB: [birth year] MRN: [id number]  Primary Cardiologist: Arun K Thukkani, MD  Chart reviewed as part of pre-operative protocol coverage. Pre-op clearance already addressed by colleagues in earlier phone notes. To summarize recommendations:  -Patient is of low cardiac risk for upcoming procedure. She has history of prior elevated CAC scoring done in 2023 without any exertional symptoms or limitations to exercise functionality. Can complete greater than 4 METS of activity without any associated symptoms. RCRI is 0.5%. Per guidelines no indications to pursue further testing and of acceptable risk for upcoming procedure. She can hold aspirin  5 days prior to procedure and resume when safe to do so.   Amada Sluder, PA-C  Will route this bundled recommendation to requesting provider via Epic fax function and remove from pre-op pool. Please call with questions.  Orren LOISE Fabry, PA-C 07/18/2024, 4:47 PM  "

## 2024-07-23 ENCOUNTER — Ambulatory Visit (HOSPITAL_BASED_OUTPATIENT_CLINIC_OR_DEPARTMENT_OTHER): Admitting: Pulmonary Disease

## 2024-09-05 ENCOUNTER — Ambulatory Visit: Admitting: Internal Medicine

## 2024-11-26 ENCOUNTER — Ambulatory Visit
